# Patient Record
Sex: Male | Born: 1980 | Race: White | Hispanic: No | Marital: Married | Smoking: Former smoker
Health system: Southern US, Community
[De-identification: ages and names within clinical notes are randomized; demographics above are authoritative.]

## PROBLEM LIST (undated history)

## (undated) DIAGNOSIS — F1910 Other psychoactive substance abuse, uncomplicated: Secondary | ICD-10-CM

## (undated) DIAGNOSIS — F32A Depression, unspecified: Secondary | ICD-10-CM

## (undated) DIAGNOSIS — F419 Anxiety disorder, unspecified: Secondary | ICD-10-CM

## (undated) HISTORY — DX: Depression, unspecified: F32.A

## (undated) HISTORY — DX: Other psychoactive substance abuse, uncomplicated: F19.10

## (undated) HISTORY — PX: APPENDECTOMY: SHX54

## (undated) HISTORY — DX: Anxiety disorder, unspecified: F41.9

---

## 2017-06-17 ENCOUNTER — Emergency Department (HOSPITAL_COMMUNITY)
Admission: EM | Admit: 2017-06-17 | Discharge: 2017-06-17 | Disposition: A | Discharge status: Home / Self Care | Payer: Self-pay | Attending: Physician Assistant | Admitting: Physician Assistant

## 2017-06-17 ENCOUNTER — Emergency Department (HOSPITAL_COMMUNITY): Payer: Self-pay

## 2017-06-17 ENCOUNTER — Other Ambulatory Visit: Payer: Self-pay

## 2017-06-17 ENCOUNTER — Encounter (HOSPITAL_COMMUNITY): Payer: Self-pay | Admitting: Emergency Medicine

## 2017-06-17 DIAGNOSIS — R55 Syncope and collapse: Secondary | ICD-10-CM | POA: Insufficient documentation

## 2017-06-17 DIAGNOSIS — Z87891 Personal history of nicotine dependence: Secondary | ICD-10-CM | POA: Insufficient documentation

## 2017-06-17 LAB — CBC
HEMATOCRIT: 39 % (ref 39.0–52.0)
HEMOGLOBIN: 13.4 g/dL (ref 13.0–17.0)
MCH: 33.2 pg (ref 26.0–34.0)
MCHC: 34.4 g/dL (ref 30.0–36.0)
MCV: 96.5 fL (ref 78.0–100.0)
Platelets: 179 10*3/uL (ref 150–400)
RBC: 4.04 MIL/uL — ABNORMAL LOW (ref 4.22–5.81)
RDW: 13.5 % (ref 11.5–15.5)
WBC: 4.4 10*3/uL (ref 4.0–10.5)

## 2017-06-17 LAB — BASIC METABOLIC PANEL
ANION GAP: 10 (ref 5–15)
BUN: 13 mg/dL (ref 6–20)
CALCIUM: 8.9 mg/dL (ref 8.9–10.3)
CHLORIDE: 104 mmol/L (ref 101–111)
CO2: 25 mmol/L (ref 22–32)
Creatinine, Ser: 0.63 mg/dL (ref 0.61–1.24)
GFR calc Af Amer: >60 mL/min (ref >60)
GFR calc non Af Amer: >60 mL/min (ref >60)
GLUCOSE: 98 mg/dL (ref 65–99)
POTASSIUM: 3.9 mmol/L (ref 3.5–5.1)
Sodium: 139 mmol/L (ref 135–145)

## 2017-06-17 LAB — I-STAT TROPONIN, ED: Troponin i, poc: 0 ng/mL (ref 0.00–0.08)

## 2017-06-17 LAB — CBG MONITORING, ED: Glucose-Capillary: 97 mg/dL (ref 65–99)

## 2017-06-17 MED ORDER — SODIUM CHLORIDE 0.9 % IV BOLUS (SEPSIS)
1000.0000 mL | Freq: Once | INTRAVENOUS | Status: AC
Start: 2017-06-17 — End: 2017-06-17
  Administered 2017-06-17: 1000 mL via INTRAVENOUS

## 2017-06-17 NOTE — Discharge Instructions (Signed)
Follow up with PCP as needed

## 2017-06-17 NOTE — ED Provider Notes (Signed)
[facility]-EMERGENCY DEPT Provider Note   CSN: [id number] Arrival date & time: 06/17/17  0710     History   Chief Complaint Chief Complaint  Patient presents with  . Near Syncope    HPI Cyndie ChimeBryan Pinder is a 36 y.o. male.  HPI   Patient is a 36 year old male presenting with presyncope.  Patient felt like he might pass out.  Patient has a history of panic attacks.  He felt hot sweaty and nauseated.  At this past after he sat down.  He had clammy hands that time.  He had some left armpit pain left chest pain.  Resolved after the moment past.  Feels back to normal now.,  Little fatigue.  Patient does drink daily.  He thinks he says that his girlfriend thinks he drinks too much but he thinks he drinks the right amount.   History reviewed. No pertinent past medical history.  There are no active problems to display for this patient.   Past Surgical History:  Procedure Laterality Date  . APPENDECTOMY         Home Medications    Prior to Admission medications   Not on File    Family History No family history on file.  Social History Social History   Tobacco Use  . Smoking status: Former Smoker    Types: Cigarettes  . Tobacco comment: Quite 04/2017  Substance Use Topics  . Alcohol use: Yes    Frequency: Never  . Drug use: No     Allergies   Flexeril [cyclobenzaprine]   Review of Systems Review of Systems  Constitutional: Negative for activity change.  Respiratory: Negative for shortness of breath.   Cardiovascular: Negative for chest pain.  Gastrointestinal: Negative for abdominal pain.  Neurological: Positive for light-headedness. Negative for syncope.     Physical Exam Updated Vital Signs BP (!) 133/96 (BP Location: Right Arm)   Pulse 83   Temp 97.7 F (36.5 C) (Oral)   Resp 13   SpO2 99%   Physical Exam  Constitutional: He is oriented to person, place, and time. He appears well-nourished.  HENT:  Head:  Normocephalic.  Poor dentition  Eyes: Conjunctivae are normal.  Cardiovascular: Normal rate and regular rhythm.  No murmur heard. Pulmonary/Chest: Effort normal and breath sounds normal. No respiratory distress.  Abdominal: Soft. He exhibits no distension. There is no tenderness.  Musculoskeletal: He exhibits no edema.  Neurological: He is oriented to person, place, and time.  Skin: Skin is warm and dry. He is not diaphoretic.  Psychiatric: He has a normal mood and affect. His behavior is normal.     ED Treatments / Results  Labs (all labs ordered are listed, but only abnormal results are displayed) Labs Reviewed  BASIC METABOLIC PANEL  CBC  URINALYSIS, ROUTINE W REFLEX MICROSCOPIC  CBG MONITORING, ED  I-STAT TROPONIN, ED    EKG  EKG Interpretation  Date/Time:  Friday June 17 2017 07:21:05 EST Ventricular Rate:  79 PR Interval:    QRS Duration: 98 QT Interval:  363 QTC Calculation: 417 R Axis:   70 Text Interpretation:  Sinus rhythm Normal sinus rhythm Confirmed by Corlis LeakMackuen, Myonna Chisom ([id number]) on 06/17/2017 8:01:49 AM       Radiology No results found.  Procedures Procedures (including critical care time)  Medications Ordered in ED Medications  sodium chloride 0.9 % bolus 1,000 mL (not administered)     Initial Impression / Assessment and Plan / ED Course  I have reviewed the triage  vital signs and the nursing notes.  Pertinent labs & imaging results that were available during my care of the patient were reviewed by me and considered in my medical decision making (see chart for details).     Patient is a 36 year old male presenting with presyncope.  Patient felt like he might pass out.  Patient has a history of panic attacks.  He felt hot sweaty and nauseated.  At this past after he sat down.  He had clammy hands that time.  He had some left armpit pain left chest pain.  Resolved after the moment past.  Feels back to normal now.,  Little fatigue.  Patient  does drink daily.  He thinks he says that his girlfriend thinks he drinks too much but he thinks he drinks the right amount.  8:30 AM Patient had here with presyncope.  Likely secondary to dehydration perhaps from drinking last night.  Either way patient has normal vital signs normal physical exam.  Given the option of getting labs or discharge home.  Patient would like labs today.  We will get baseline labs, give a liter fluid.  Anticipate ability to discharge home.  Final Clinical Impressions(s) / ED Diagnoses   Final diagnoses:  None    ED Discharge Orders    None       Abelino DerrickMackuen, Jasiel Apachito Lyn, MD 06/17/17 1639

## 2017-06-17 NOTE — ED Notes (Signed)
ED Provider at bedside. 

## 2017-06-17 NOTE — ED Triage Notes (Signed)
Pt verbalizes "felt faint" without fall an hour ago at work; associated symptoms of nausea and left armpit pain. Pain subsided since event.

## 2017-12-25 DIAGNOSIS — L03011 Cellulitis of right finger: Secondary | ICD-10-CM | POA: Insufficient documentation

## 2017-12-25 HISTORY — DX: Cellulitis of right finger: L03.011

## 2018-02-23 ENCOUNTER — Encounter (HOSPITAL_BASED_OUTPATIENT_CLINIC_OR_DEPARTMENT_OTHER): Payer: Self-pay | Admitting: Emergency Medicine

## 2018-02-23 ENCOUNTER — Emergency Department (HOSPITAL_BASED_OUTPATIENT_CLINIC_OR_DEPARTMENT_OTHER)
Admission: EM | Admit: 2018-02-23 | Discharge: 2018-02-23 | Disposition: A | Discharge status: Home / Self Care | Payer: BLUE CROSS/BLUE SHIELD | Attending: Emergency Medicine | Admitting: Emergency Medicine

## 2018-02-23 ENCOUNTER — Other Ambulatory Visit: Payer: Self-pay

## 2018-02-23 DIAGNOSIS — Y999 Unspecified external cause status: Secondary | ICD-10-CM | POA: Diagnosis not present

## 2018-02-23 DIAGNOSIS — Y929 Unspecified place or not applicable: Secondary | ICD-10-CM | POA: Insufficient documentation

## 2018-02-23 DIAGNOSIS — S61012A Laceration without foreign body of left thumb without damage to nail, initial encounter: Secondary | ICD-10-CM | POA: Diagnosis present

## 2018-02-23 DIAGNOSIS — Y93G1 Activity, food preparation and clean up: Secondary | ICD-10-CM | POA: Insufficient documentation

## 2018-02-23 DIAGNOSIS — W268XXA Contact with other sharp object(s), not elsewhere classified, initial encounter: Secondary | ICD-10-CM | POA: Diagnosis not present

## 2018-02-23 DIAGNOSIS — S68628A Partial traumatic transphalangeal amputation of other finger, initial encounter: Secondary | ICD-10-CM | POA: Insufficient documentation

## 2018-02-23 DIAGNOSIS — S68119A Complete traumatic metacarpophalangeal amputation of unspecified finger, initial encounter: Secondary | ICD-10-CM

## 2018-02-23 DIAGNOSIS — Z87891 Personal history of nicotine dependence: Secondary | ICD-10-CM | POA: Diagnosis not present

## 2018-02-23 MED ORDER — CEPHALEXIN 250 MG PO CAPS
1000.0000 mg | ORAL_CAPSULE | Freq: Once | ORAL | Status: AC
  Administered 2018-02-23: 1000 mg via ORAL
  Filled 2018-02-23: qty 4

## 2018-02-23 MED ORDER — LIDOCAINE HCL (PF) 1 % IJ SOLN
5.0000 mL | Freq: Once | INTRAMUSCULAR | Status: AC
  Administered 2018-02-23: 5 mL via INTRADERMAL

## 2018-02-23 MED ORDER — CEPHALEXIN 500 MG PO CAPS: 500.0000 mg | ORAL_CAPSULE | Freq: Four times a day (QID) | ORAL | 0 refills | Status: DC

## 2018-02-23 MED ORDER — TETANUS-DIPHTH-ACELL PERTUSSIS 5-2.5-18.5 LF-MCG/0.5 IM SUSP
0.5000 mL | Freq: Once | INTRAMUSCULAR | Status: AC
  Administered 2018-02-23: 0.5 mL via INTRAMUSCULAR
  Filled 2018-02-23: qty 0.5

## 2018-02-23 MED ORDER — LIDOCAINE HCL (PF) 1 % IJ SOLN
INTRAMUSCULAR | Status: AC
  Filled 2018-02-23: qty 5

## 2018-02-23 NOTE — ED Triage Notes (Signed)
Pt sustained laceration to right thumb on food slicer last night at 2000. Mild bleeding noted.

## 2018-02-23 NOTE — ED Provider Notes (Signed)
[facility] Provider Note: Lowella Dell, MD, FACEP  CSN: [id number] MRN: [id number] ARRIVAL: 02/23/18 at 0337 ROOM: [facility]   CHIEF COMPLAINT  Laceration   HISTORY OF PRESENT ILLNESS  02/23/18 3:47 AM John Maxwell is a 37 y.o. male sliced the tip of his right thumb with a vegetable slicer yesterday evening about 8 PM.  He presents now because of persistent oozing of blood from the wound.  He had moderate pain at the site earlier which has improved.  There is a flap of skin that is still adherent to the thumb by a small pedicle.   History reviewed. No pertinent past medical history.  Past Surgical History:  Procedure Laterality Date  . APPENDECTOMY      No family history on file.  Social History   Tobacco Use  . Smoking status: Former Smoker    Types: Cigarettes  . Tobacco comment: Quite 04/2017  Substance Use Topics  . Alcohol use: Yes    Frequency: Never  . Drug use: No    Prior to Admission medications   Not on File    Allergies Flexeril [cyclobenzaprine]   REVIEW OF SYSTEMS  Negative except as noted here or in the History of Present Illness.   PHYSICAL EXAMINATION  Initial Vital Signs Blood pressure (!) 151/99, pulse 84, temperature 98.1 F (36.7 C), temperature source Oral, resp. rate 18, height 5\' 7"  (1.702 m), weight 74.8 kg, SpO2 98 %.  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 Imaging Studies: No results found.  ED COURSE and MDM  Nursing notes and initial vitals signs, including pulse oximetry, reviewed.  Vitals:   02/23/18 0344 02/23/18 0345  BP: (!) 151/99   Pulse: 84   Resp: 18   Temp: 98.1 F (36.7 C)   TempSrc: Oral   SpO2: 98%   Weight:  74.8 kg  Height:  5\' 7"  (1.702 m)   Patient started on Keflex for infection prophylaxis given subacute presentation.  The flap was sutured down in place with improvement in the bleeding.  The patient was advised that this flap may not "take" but will act as a natural bandage while the wound heals.  He was advised to return for signs of infection otherwise have the sutures out in about a week.  PROCEDURES   LACERATION REPAIR Performed by: Hanley Seamen Authorized by: Hanley Seamen Consent: Verbal consent obtained. Risks and benefits: risks, benefits and alternatives were discussed Consent given by: patient Patient identity confirmed: provided demographic data Prepped and Draped in normal sterile fashion Wound explored  Laceration Location: Right thumb  Laceration Length: 3 cm (flap)  No Foreign Bodies seen or palpated  Anesthesia: local infiltration  Local anesthetic: lidocaine 2% without epinephrine  Anesthetic total: 2 ml  Irrigation method: syringe Amount of cleaning: standard  Skin closure: 4-0 Prolene  Number of sutures: 5  Technique: Interrupted  Patient tolerance: Patient tolerated the procedure well with no immediate complications.     ED DIAGNOSES     ICD-10-CM   1. Traumatic amputation of fingertip, initial encounter Z61.096ES68.119A        Paula LibraMolpus, Analucia Hush,  MD 02/23/18 [phone]

## 2018-05-22 ENCOUNTER — Emergency Department (HOSPITAL_BASED_OUTPATIENT_CLINIC_OR_DEPARTMENT_OTHER)
Admission: EM | Admit: 2018-05-22 | Discharge: 2018-05-22 | Disposition: A | Discharge status: Home / Self Care | Payer: BLUE CROSS/BLUE SHIELD | Attending: Emergency Medicine | Admitting: Emergency Medicine

## 2018-05-22 ENCOUNTER — Other Ambulatory Visit: Payer: Self-pay

## 2018-05-22 ENCOUNTER — Encounter (HOSPITAL_BASED_OUTPATIENT_CLINIC_OR_DEPARTMENT_OTHER): Payer: Self-pay

## 2018-05-22 DIAGNOSIS — Z87891 Personal history of nicotine dependence: Secondary | ICD-10-CM | POA: Insufficient documentation

## 2018-05-22 DIAGNOSIS — R030 Elevated blood-pressure reading, without diagnosis of hypertension: Secondary | ICD-10-CM | POA: Diagnosis not present

## 2018-05-22 DIAGNOSIS — K0889 Other specified disorders of teeth and supporting structures: Secondary | ICD-10-CM

## 2018-05-22 MED ORDER — AMOXICILLIN-POT CLAVULANATE 875-125 MG PO TABS
1.0000 | ORAL_TABLET | Freq: Once | ORAL | Status: AC
  Administered 2018-05-22: 1 via ORAL
  Filled 2018-05-22: qty 1

## 2018-05-22 MED ORDER — NAPROXEN 500 MG PO TABS: 500.0000 mg | ORAL_TABLET | Freq: Two times a day (BID) | ORAL | 0 refills | Status: AC

## 2018-05-22 MED ORDER — AMOXICILLIN-POT CLAVULANATE 875-125 MG PO TABS: 1.0000 | ORAL_TABLET | Freq: Two times a day (BID) | ORAL | 0 refills | Status: AC

## 2018-05-22 MED ORDER — LIDOCAINE VISCOUS HCL 2 % MT SOLN: 15.0000 mL | OROMUCOSAL | 0 refills | Status: DC | PRN

## 2018-05-22 MED ORDER — CHLORHEXIDINE GLUCONATE 0.12 % MT SOLN: 15.0000 mL | Freq: Two times a day (BID) | OROMUCOSAL | 0 refills | Status: DC

## 2018-05-22 NOTE — Discharge Instructions (Addendum)
Please see the information and instructions below regarding your visit.  Your diagnoses today include:  1. Pain, dental   2. Elevated blood pressure reading without diagnosis of hypertension    You have a dental infection. It is very important that you get evaluated by a dentist as soon as possible. Call tomorrow to schedule an appointment. Ibuprofen or naproxen as needed for pain. Take your full course of antibiotics. Read the instructions below.  Tests performed today include: See side panel of your discharge paperwork for testing performed today. Vital signs are listed at the bottom of these instructions.   Medications prescribed:    Take any prescribed medications only as prescribed, and any over the counter medications only as directed on the packaging.  1. You are prescribed Augmentin, an antibiotic. Please take all of your antibiotics until finished.   You may develop abdominal discomfort or nausea from the antibiotic. If this occurs, you may take it with food. Some patients also get diarrhea with antibiotics. You may help offset this with probiotics which you can buy or get in yogurt. Do not eat or take the probiotics until 2 hours after your antibiotic. Some women develop vaginal yeast infections after antibiotics. If you develop unusual vaginal discharge after being on this medication, please see your primary care provider.   Some people develop allergies to antibiotics. Symptoms of antibiotic allergy can be mild and include a flat rash and itching. They can also be more serious and include:  ?Hives - Hives are raised, red patches of skin that are usually very itchy.  ?Lip or tongue swelling  ?Trouble swallowing or breathing  ?Blistering of the skin or mouth.  If you have any of these serious symptoms, please seek emergency medical care immediately.  2. You are prescribed naproxen, a non-steroidal anti-inflammatory agent (NSAID) for pain. You may take 500 mg every 12 hours as  needed for pain. If still requiring this medication around the clock for acute pain after 10 days, please see your primary healthcare provider.  You may combine this medication with Tylenol, 650 mg every 6 hours, so you are receiving something for pain every 3 hours.  This is not a long-term medication unless under the care and direction of your primary provider. Taking this medication long-term and not under the supervision of a healthcare provider could increase the risk of stomach ulcers, kidney problems, and cardiovascular problems such as high blood pressure.   3. You are prescribed viscous lidocaine swish and spit every 6 hours as needed for tooth and gum discomfort. Do not swallow.   4. You are prescribed chlorhexidine solution swish and spit twice daily. Do not swallow. This is to clear bacteria from your mouth.    Home care instructions:  Please follow any educational materials contained in this packet.   Eat a soft or liquid diet and rinse your mouth out after meals with warm water. You should see a dentist or return here at once if you have increased swelling, increased pain or uncontrolled bleeding from the site of your injury.  Follow-up instructions: It is very important that you see a dentist as soon as possible. There is a list of dentists attached to this packet if you do not have care established with a dentist already. Please give a call to a dentist of your choice tomorrow.  Return instructions:  Please return to the Emergency Department if you experience worsening symptoms.  Please seek care if you note any of the following  about your dental pain:  You have increased pain not controlled with medicines.  You have swelling around your tooth, in your face or neck.  You have bleeding which starts, continues, or gets worse.  You have a fever >101 If you are unable to open your mouth Please return if you have any other emergent concerns.  Additional Information:   Your  vital signs today were: BP (!) 136/93 (BP Location: Left Arm)    Pulse 95    Temp 99 F (37.2 C) (Oral)    Resp 16    Ht 5\' 7"  (1.702 m)    Wt 81.6 kg    SpO2 96%    BMI 28.19 kg/m  If your blood pressure (BP) was elevated on multiple readings during this visit above 130 for the top number or above 80 for the bottom number, please have this repeated by your primary care provider within one month. --------------  Thank you for allowing [place] to participate in your care today.

## 2018-05-22 NOTE — ED Provider Notes (Signed)
[facility] EMERGENCY DEPARTMENT Provider Note   CSN: [id number] Arrival date & time: 05/22/18  2004     History   Chief Complaint Chief Complaint  Patient presents with  . Dental Pain    HPI Cyndie ChimeBryan Gasiorowski is a 37 y.o. male.  HPI    Cyndie ChimeBryan Scobee is a 37 y.o. male who presents to the Emergency Department complaining of persistent, gradually worsening, right-sided, lower dental pain beginning one day ago. Pt describes their pain as sharp throbbing. Pt has been taking an old antibiotic at home with minimal relief of pain. Pain is exacerbated by chewing or palpation. They are currently followed by dentistry.  Pt reports that he feels a slight swelling on the lateral surface of his right side of his cheek but denies fever, chills, difficulty breathing, difficulty swallowing.   History reviewed. No pertinent past medical history.  There are no active problems to display for this patient.   Past Surgical History:  Procedure Laterality Date  . APPENDECTOMY          Home Medications    Prior to Admission medications   Not on File    Family History No family history on file.  Social History Social History   Tobacco Use  . Smoking status: Former Smoker    Types: Cigarettes  . Smokeless tobacco: Never Used  Substance Use Topics  . Alcohol use: Yes    Frequency: Never    Comment: daily  . Drug use: No     Allergies   Flexeril [cyclobenzaprine]   Review of Systems Review of Systems  Constitutional: Negative for chills and fever.  HENT: Positive for dental problem. Negative for sore throat, trouble swallowing and voice change.   Respiratory: Negative for shortness of breath and stridor.      Physical Exam Updated Vital Signs BP (!) 136/93 (BP Location: Left Arm)   Pulse 95   Temp 99 F (37.2 C) (Oral)   Resp 16   Ht 5\' 7"  (1.702 m)   Wt 81.6 kg   SpO2 96%   BMI 28.19 kg/m   Physical Exam  Constitutional: He appears well-developed  and well-nourished. No distress.  Sitting comfortably in bed.  HENT:  Head: Normocephalic and atraumatic.  Mouth/Throat:    Dental cavities and poor oral dentition noted. Pain along tooth as depicted in image. No abscess noted. Midline uvula. No trismus. OP clear and moist. No oropharyngeal erythema or edema. Neck supple with no tenderness. No facial edema.  Eyes: Conjunctivae are normal. Right eye exhibits no discharge. Left eye exhibits no discharge.  EOMs normal to gross examination.  Neck: Normal range of motion.  Cardiovascular: Normal rate and regular rhythm.  Intact, 2+ radial pulse.  Pulmonary/Chest: Effort normal.  Normal respiratory effort. Patient converses comfortably. No audible wheeze or stridor.  Abdominal: He exhibits no distension.  Musculoskeletal: Normal range of motion.  Neurological: He is alert.  Cranial nerves intact to gross observation. Patient moves extremities without difficulty.  Skin: Skin is warm and dry. He is not diaphoretic.  Psychiatric: He has a normal mood and affect. His behavior is normal. Judgment and thought content normal.  Nursing note and vitals reviewed.    ED Treatments / Results  Labs (all labs ordered are listed, but only abnormal results are displayed) Labs Reviewed - No data to display  EKG None  Radiology No results found.  Procedures Procedures (including critical care time)  Medications Ordered in ED Medications  amoxicillin-clavulanate (AUGMENTIN) 875-125 MG  per tablet 1 tablet (has no administration in time range)     Initial Impression / Assessment and Plan / ED Course  I have reviewed the triage vital signs and the nursing notes.  Pertinent labs & imaging results that were available during my care of the patient were reviewed by me and considered in my medical decision making (see chart for details).     Oliver Heitzenrater is a 37 y.o. male who presents to ED for dental pain. No abscess requiring immediate  incision and drainage. Patient is afebrile, non toxic appearing, and swallowing secretions well. Exam not concerning for Ludwig's angina or pharyngeal abscess. Will treat with Augmentin given subjective sense of face swelling.  We will also treat with chlorhexidine rinse and viscous lidocaine.  Patient to follow-up with his primary dentist.  Patient voices understanding and is agreeable to plan.  Final Clinical Impressions(s) / ED Diagnoses   Final diagnoses:  Pain, dental  Elevated blood pressure reading without diagnosis of hypertension    ED Discharge Orders         Ordered    amoxicillin-clavulanate (AUGMENTIN) 875-125 MG tablet  Every 12 hours     05/22/18 2247    chlorhexidine (PERIDEX) 0.12 % solution  2 times daily     05/22/18 2247    lidocaine (XYLOCAINE) 2 % solution  As needed     05/22/18 2247    naproxen (NAPROSYN) 500 MG tablet  2 times daily     05/22/18 2247           Elisha Ponder, PA-C 05/23/18 0127    Sabas Sous, MD 05/23/18 2318

## 2018-05-22 NOTE — ED Triage Notes (Signed)
C/o right lower toothache x 5 months-worse x 2 days-hx of poor dental hygiene-NAD-steady gait

## 2020-12-05 DIAGNOSIS — S91332A Puncture wound without foreign body, left foot, initial encounter: Secondary | ICD-10-CM | POA: Diagnosis not present

## 2020-12-05 DIAGNOSIS — Z23 Encounter for immunization: Secondary | ICD-10-CM | POA: Diagnosis not present

## 2021-06-23 ENCOUNTER — Other Ambulatory Visit: Payer: Self-pay

## 2021-06-23 ENCOUNTER — Other Ambulatory Visit (HOSPITAL_BASED_OUTPATIENT_CLINIC_OR_DEPARTMENT_OTHER): Payer: Self-pay

## 2021-06-23 ENCOUNTER — Emergency Department (HOSPITAL_BASED_OUTPATIENT_CLINIC_OR_DEPARTMENT_OTHER): Payer: BC Managed Care – PPO

## 2021-06-23 ENCOUNTER — Encounter (HOSPITAL_BASED_OUTPATIENT_CLINIC_OR_DEPARTMENT_OTHER): Payer: Self-pay | Admitting: *Deleted

## 2021-06-23 ENCOUNTER — Emergency Department (HOSPITAL_BASED_OUTPATIENT_CLINIC_OR_DEPARTMENT_OTHER)
Admission: EM | Admit: 2021-06-23 | Discharge: 2021-06-23 | Disposition: A | Discharge status: Home / Self Care | Payer: BC Managed Care – PPO | Attending: Emergency Medicine | Admitting: Emergency Medicine

## 2021-06-23 DIAGNOSIS — X501XXA Overexertion from prolonged static or awkward postures, initial encounter: Secondary | ICD-10-CM | POA: Diagnosis not present

## 2021-06-23 DIAGNOSIS — Z87891 Personal history of nicotine dependence: Secondary | ICD-10-CM | POA: Diagnosis not present

## 2021-06-23 DIAGNOSIS — S20212A Contusion of left front wall of thorax, initial encounter: Secondary | ICD-10-CM | POA: Insufficient documentation

## 2021-06-23 DIAGNOSIS — S20211A Contusion of right front wall of thorax, initial encounter: Secondary | ICD-10-CM | POA: Insufficient documentation

## 2021-06-23 DIAGNOSIS — R0789 Other chest pain: Secondary | ICD-10-CM

## 2021-06-23 DIAGNOSIS — S299XXA Unspecified injury of thorax, initial encounter: Secondary | ICD-10-CM | POA: Diagnosis not present

## 2021-06-23 MED ORDER — LIDOCAINE 5 % EX PTCH
1.0000 | MEDICATED_PATCH | CUTANEOUS | 0 refills | Status: DC
  Filled 2021-06-23: qty 30, 30d supply, fill #0

## 2021-06-23 NOTE — Discharge Instructions (Addendum)
Recheck with your primary care provider if pain continues.  Return to the emergency room for worsening or concerning symptoms.  You can apply Lidoderm patch to your chest wall as needed for pain.  Use incentive spirometer as discussed.

## 2021-06-23 NOTE — ED Provider Notes (Signed)
[facility] EMERGENCY DEPARTMENT Provider Note   CSN: [id number] Arrival date & time: 06/23/21  1259     History Chief Complaint  Patient presents with   Rib Injury    John Maxwell is a 40 y.o. male.  40 year old male with complaint of bilateral, L>R rib pain x 2 days, onset after he was leaning over the railing trying to pull something over the railing and felt a pop in his left ribs. Pain radiates to bilateral posterior rib cage. Denies SHOB, chills, abdominal pain. No other complaints or concerns.       History reviewed. No pertinent past medical history.  There are no problems to display for this patient.   Past Surgical History:  Procedure Laterality Date   APPENDECTOMY         No family history on file.  Social History   Tobacco Use   Smoking status: Former    Types: Cigarettes   Smokeless tobacco: Never  Vaping Use   Vaping Use: Never used  Substance Use Topics   Alcohol use: Yes    Comment: daily   Drug use: No    Home Medications Prior to Admission medications   Medication Sig Start Date End Date Taking? Authorizing Provider  lidocaine (LIDODERM) 5 % Place 1 patch onto the skin daily. Remove & Discard patch within 12 hours or as directed by MD 06/23/21  Yes Jeannie Fend, PA-C  chlorhexidine (PERIDEX) 0.12 % solution Use as directed 15 mLs in the mouth or throat 2 (two) times daily. Swish and spit.  Do not swallow. 05/22/18   Aviva Kluver B, PA-C  lidocaine (XYLOCAINE) 2 % solution Use as directed 15 mLs in the mouth or throat as needed for mouth pain. Swish and spit.  Do not swallow. 05/22/18   Aviva Kluver B, PA-C    Allergies    Flexeril [cyclobenzaprine]  Review of Systems   Review of Systems  Constitutional:  Negative for diaphoresis and fever.  Respiratory:  Negative for chest tightness and shortness of breath.   Cardiovascular:  Positive for chest pain.  Gastrointestinal:  Negative for abdominal pain.   Musculoskeletal:  Positive for back pain. Negative for arthralgias and myalgias.  Skin:  Negative for rash and wound.  Neurological:  Negative for weakness.  Hematological:  Does not bruise/bleed easily.  Psychiatric/Behavioral:  Negative for confusion.   All other systems reviewed and are negative.  Physical Exam Updated Vital Signs BP (!) 130/94 (BP Location: Right Arm)    Pulse 88    Temp 99.8 F (37.7 C) (Oral)    Resp 18    Ht 5\' 7"  (1.702 m)    Wt 74.8 kg    SpO2 97%    BMI 25.84 kg/m   Physical Exam Vitals and nursing note reviewed.  Cardiovascular:     Rate and Rhythm: Normal rate and regular rhythm.     Heart sounds: Normal heart sounds.  Pulmonary:     Effort: Pulmonary effort is normal.     Breath sounds: Normal breath sounds.  Chest:       Comments: Very mild bilateral lower anterior chest wall tenderness without crepitus  Abdominal:     Palpations: Abdomen is soft.     Tenderness: There is no abdominal tenderness.  Skin:    General: Skin is warm and dry.     Findings: Bruising present.  Neurological:     Mental Status: He is alert and oriented to person, place, and  time.  Psychiatric:        Behavior: Behavior normal.    ED Results / Procedures / Treatments   Labs (all labs ordered are listed, but only abnormal results are displayed) Labs Reviewed - No data to display  EKG None  Radiology DG Ribs Unilateral W/Chest Left  Result Date: 06/23/2021 CLINICAL DATA:  rib pain, injury EXAM: LEFT RIBS AND CHEST - 3+ VIEW COMPARISON:  Chest radiographs 06/17/2017. FINDINGS: No displaced fracture. No visible pneumothorax or pleural effusion. Both lungs are clear. Heart size and mediastinal contours are within normal limits. IMPRESSION: No evidence of a displaced rib fracture. Electronically Signed   By: Feliberto Harts M.D.   On: 06/23/2021 14:51    Procedures Procedures   Medications Ordered in ED Medications - No data to display  ED Course  I have  reviewed the triage vital signs and the nursing notes.  Pertinent labs & imaging results that were available during my care of the patient were reviewed by me and considered in my medical decision making (see chart for details).  Clinical Course as of 06/23/21 1541  Tue Jun 23, 2021  9214 40 year old male presents with complaint of pain in his ribs after leaning over a railing to pull something up over the railing 2 days ago.  On exam, he is found to have tenderness to the anterior lower chest wall without crepitus.  There is a small area of ecchymosis to the upper chest which appears old.  Abdomen is soft and nontender.  Vitals reviewed, blood pressure is mildly elevated, O2 sat 97% on room air, lungs good auscultation. Rib series x-rays obtained, negative for fracture.  Recommend incentive spirometer, can take Motrin Tylenol for pain or apply Lidoderm patch.  Return to ED for worsening or concerning symptoms otherwise follow-up with PCP. [LM]    Clinical Course User Index [LM] John Maxwell   MDM Rules/Calculators/A&P                             Final Clinical Impression(s) / ED Diagnoses Final diagnoses:  Acute chest wall pain    Rx / DC Orders ED Discharge Orders          Ordered    lidocaine (LIDODERM) 5 %  Every 24 hours        06/23/21 1503             Jeannie Fend, PA-C 06/23/21 1541    Milagros Loll, MD 06/25/21 [phone]

## 2021-06-23 NOTE — ED Triage Notes (Signed)
Injury to his right ribs a week ago. This week he was leaning over a bar and felt a "pop" in his left ribs.

## 2021-08-05 DIAGNOSIS — K5901 Slow transit constipation: Secondary | ICD-10-CM | POA: Diagnosis not present

## 2021-10-06 ENCOUNTER — Emergency Department (HOSPITAL_BASED_OUTPATIENT_CLINIC_OR_DEPARTMENT_OTHER): Payer: BC Managed Care – PPO

## 2021-10-06 ENCOUNTER — Other Ambulatory Visit: Payer: Self-pay

## 2021-10-06 ENCOUNTER — Encounter (HOSPITAL_BASED_OUTPATIENT_CLINIC_OR_DEPARTMENT_OTHER): Payer: Self-pay

## 2021-10-06 ENCOUNTER — Emergency Department (HOSPITAL_BASED_OUTPATIENT_CLINIC_OR_DEPARTMENT_OTHER)
Admission: EM | Admit: 2021-10-06 | Discharge: 2021-10-06 | Disposition: A | Discharge status: Home / Self Care | Payer: BC Managed Care – PPO | Attending: Emergency Medicine | Admitting: Emergency Medicine

## 2021-10-06 DIAGNOSIS — W208XXA Other cause of strike by thrown, projected or falling object, initial encounter: Secondary | ICD-10-CM | POA: Insufficient documentation

## 2021-10-06 DIAGNOSIS — M25572 Pain in left ankle and joints of left foot: Secondary | ICD-10-CM | POA: Diagnosis not present

## 2021-10-06 DIAGNOSIS — M79672 Pain in left foot: Secondary | ICD-10-CM | POA: Diagnosis not present

## 2021-10-06 MED ORDER — IBUPROFEN 800 MG PO TABS: 800.0000 mg | ORAL_TABLET | Freq: Three times a day (TID) | ORAL | 0 refills | Status: DC

## 2021-10-06 MED ORDER — IBUPROFEN 800 MG PO TABS
800.0000 mg | ORAL_TABLET | Freq: Once | ORAL | Status: AC
  Administered 2021-10-06: 800 mg via ORAL
  Filled 2021-10-06: qty 1

## 2021-10-06 NOTE — Discharge Instructions (Addendum)
Take ibuprofen 3 times daily for the next 7 days. ?Ice the foot, keep it elevated at home.  Take the next few days off work, tomorrow morning call the orthopedic doctor and set up an appointment for later this week.  Return if worse. ?

## 2021-10-06 NOTE — ED Provider Notes (Signed)
?[facility] EMERGENCY DEPARTMENT ?Provider Note ? ? ?CSN: [id number] ?Arrival date & time: 10/06/21  1758 ? ?  ? ?History ? ?Chief Complaint  ?Patient presents with  ? Ankle Pain  ? ? ?John Maxwell is a 41 y.o. male. ? ? ?Ankle Pain ? ?Patient presents with left foot and ankle pain.  It started 2 to 3 weeks ago when he dropped a heavy object on it.  Has been having pain constantly, has not been getting any better and has been getting worse with time.  Pain is worsened whenever he walks on it or when he everts it.  He has not tried anything at home for it, he works on his feet all day at [employer]  Denies any paresthesias.  ? ?Home Medications ?Prior to Admission medications   ?Medication Sig Start Date End Date Taking? Authorizing Provider  ?chlorhexidine (PERIDEX) 0.12 % solution Use as directed 15 mLs in the mouth or throat 2 (two) times daily. Swish and spit.  Do not swallow. 05/22/18   Aviva Kluver B, PA-C  ?lidocaine (LIDODERM) 5 % Place 1 patch onto the skin daily. Remove & Discard patch within 12 hours or as directed by MD 06/23/21   Jeannie Fend, PA-C  ?lidocaine (XYLOCAINE) 2 % solution Use as directed 15 mLs in the mouth or throat as needed for mouth pain. Swish and spit.  Do not swallow. 05/22/18   Elisha Ponder, PA-C  ?   ? ?Allergies    ?Flexeril [cyclobenzaprine]   ? ?Review of Systems   ?Review of Systems ? ?Physical Exam ?Updated Vital Signs ?BP (!) 119/94 (BP Location: Right Arm)   Pulse 81   Temp 98.3 ?F (36.8 ?C) (Oral)   Resp 16   Ht 5\' 7"  (1.702 m)   SpO2 99%   BMI 25.84 kg/m?  ?Physical Exam ?Vitals and nursing note reviewed. Exam conducted with a chaperone present.  ?Constitutional:   ?   General: He is not in acute distress. ?   Appearance: Normal appearance.  ?HENT:  ?   Head: Normocephalic and atraumatic.  ?Eyes:  ?   General: No scleral icterus. ?   Extraocular Movements: Extraocular movements intact.  ?   Pupils: Pupils are equal, round, and reactive to light.   ?Cardiovascular:  ?   Pulses: Normal pulses.  ?Musculoskeletal:     ?   General: Swelling and tenderness present.  ?   Comments: ROM intact, pain is worse with eversion of the left foot.  ?Skin: ?   Capillary Refill: Capillary refill takes less than 2 seconds.  ?   Coloration: Skin is not jaundiced.  ?Neurological:  ?   Mental Status: He is alert. Mental status is at baseline.  ?   Coordination: Coordination normal.  ? ? ?ED Results / Procedures / Treatments   ?Labs ?(all labs ordered are listed, but only abnormal results are displayed) ?Labs Reviewed - No data to display ? ?EKG ?None ? ?Radiology ?DG Ankle Complete Left ? ?Result Date: 10/06/2021 ?CLINICAL DATA:  Left ankle pain EXAM: LEFT ANKLE COMPLETE - 3+ VIEW COMPARISON:  None. FINDINGS: There is no evidence of fracture, dislocation, or joint effusion. There is no evidence of arthropathy or other focal bone abnormality. Soft tissues are unremarkable. IMPRESSION: No acute abnormality Electronically Signed   By: 12/06/2021.  Shick M.D.   On: 10/06/2021 18:46   ? ?Procedures ?Procedures  ? ? ?Medications Ordered in ED ?Medications  ?ibuprofen (ADVIL) tablet 800 mg (has no  administration in time range)  ? ? ?ED Course/ Medical Decision Making/ A&P ?  ?                        ?Medical Decision Making ?Amount and/or Complexity of Data Reviewed ?Radiology: ordered. ? ?Risk ?Prescription drug management. ? ? ?This is a 41 year old male presenting with left foot and ankle pain.  Differential diagnosis includes but is not limited to fracture, dislocation, vascular injury, compartment syndrome. ? ?On physical exam patient has good range of motion, tolerates passive range of motion making septic joint unlikely.  There is no obvious discoloration to the skin, no contusions or open fractures.  DP and PT are 2+, brisk cap refill. ? ?I ordered, viewed and interpreted the x-ray and I do not see any fractures dislocations.  Agree with radiology interpretation. ? ?I ordered patient  ibuprofen. ? ?I suspect patient is having myalgias, that said he is been having pain for decent amount of time.  Will discharge with ibuprofen course and have him follow-up with orthopedics.  Work note provided. ? ? ? ? ? ? ? ?Final Clinical Impression(s) / ED Diagnoses ?Final diagnoses:  ?None  ? ? ?Rx / DC Orders ?ED Discharge Orders   ? ? None  ? ?  ? ? ?  ?Theron Arista, PA-C ?10/06/21 2116 ? ?  ?Sloan Leiter, DO ?10/09/21 0052 ? ?

## 2021-10-06 NOTE — ED Triage Notes (Addendum)
Pt arrives with left ankle pain that started last week. Per pt, he dropped an object on his ankle and the pain has gotten worse and started to swell.  ?

## 2021-10-22 DIAGNOSIS — L03012 Cellulitis of left finger: Secondary | ICD-10-CM | POA: Diagnosis not present

## 2022-02-21 ENCOUNTER — Ambulatory Visit (HOSPITAL_COMMUNITY)
Admission: EM | Admit: 2022-02-21 | Discharge: 2022-02-21 | Disposition: A | Discharge status: Home / Self Care | Payer: BC Managed Care – PPO

## 2022-02-21 DIAGNOSIS — F10230 Alcohol dependence with withdrawal, uncomplicated: Secondary | ICD-10-CM | POA: Diagnosis not present

## 2022-02-21 NOTE — ED Triage Notes (Signed)
Pt presents to [facility] accompanied by his wife seeking detox and treatment for alcoholism. Pt reports consuming about 3, 12oz beers daily. Pt reports last use this morning. Pt states he works third shift and uses alcohol to help him sleep. Pt denies SI/HI and AVH.

## 2022-02-21 NOTE — ED Provider Notes (Signed)
Behavioral Health Urgent Care Medical Screening Exam  Patient Name: John Maxwell MRN: [id number] Date of Evaluation: 02/21/22 Diagnosis:  Final diagnoses:  Alcohol dependence with uncomplicated withdrawal (HCC)    History of Present illness: John Maxwell is a 41 y.o. male patient who presented to the [facility] behavioral health urgent care voluntary accompanied by his wife Alcario Drought with a chief complaint of requesting help with alcohol abuse.   Patient is seen and evaluated face-to-face by this provider, and chart reviewed. Patient has no past significant psychiatric history. On evaluation, patient is alert and oriented x 4. His thought process is logical and goal oriented. His speech is clear and coherent. His mood is anxious and affect is congruent.  Patient states that he is looking for outpatient resources for alcohol abuse treatment. He reports drinking 3 (12-16 oz) beers or more daily. He states that his last drink was this morning when he started to experience withdrawal symptoms. He states that he tried to stop drinking two days and started having withdrawal symptoms. He describes his symptoms at that time as vomiting, tremors, and chills. He denies withdrawal symptoms at this time. He reports drinking alcohol since age 89. He states that he works full-time on third shift at [employer] and that he has to drink to help him sleep during the day. He states that his longest sobriety was for 29 weeks in 2007 when he was in the [employer] and when he was deployed to [place]. He denies any history of alcohol withdrawal seizures or delirium tremens. He denies using illicit drugs.   He denies suicidal ideations. He denies homicidal ideations. There is no objective evidence that the patient is currently responding to internal or external stimuli. He denies a past psychiatric history. He denies past psychiatric hospitalizations. He resides with his wife and 11-year-old daughter. He reports a family history of  his father committing suicide by shooting himself 6 years ago.  Plan of care. I discussed with the patient inpatient detox versus outpatient substance abuse treatment. I highly suggested that the patient be admitted to the facility based crisis unit for detox. Patient states that he is interested in outpatient treatment at this time and is not ready to commit to inpatient detox. He states that he will talk it over with his wife but for now he would like resources for outpatient. I discussed with the patient the symptoms and signs associated with alcohol withdrawal. Patient advised to present to the nearest emergency department or call 911 if he starts to develop severe withdrawal symptoms. Patient verbalizes understanding and agrees to the stated plan. Patient provided with educational resources on alcohol abuse and withdrawal. He was provided with a list of resources for inpatient/outpatient substance abuse facilities.    Psychiatric Specialty Exam  Presentation  General Appearance:Casual  Eye Contact:Fair  Speech:Clear and Coherent  Speech Volume:Normal  Mood and Affect  Mood:Anxious  Affect:Congruent   Thought Process  Thought Processes:Coherent; Goal Directed  Descriptions of Associations:Intact  Orientation:Full (Time, Place and Person)  Thought Content:Logical    Hallucinations:None  Ideas of Reference:None  Suicidal Thoughts:No  Homicidal Thoughts:No   Sensorium  Memory:Immediate Fair; Recent Fair; Remote Fair  Judgment:Intact  Insight:Present   Executive Functions  Concentration:Fair  Attention Span:Fair  Recall:Fair  Fund of Knowledge:Fair  Language:Fair   Psychomotor Activity  Psychomotor Activity:Normal   Assets  Assets:Communication Skills; Desire for Improvement; Financial Resources/Insurance; Housing; Intimacy; Leisure Time; Physical Health; Social Support; Resilience; Transportation; Talents/Skills   Sleep  Sleep:Poor  Number  of  hours: 5   Physical Exam: Physical Exam HENT:     Head: Normocephalic.     Nose: Nose normal.  Eyes:     Conjunctiva/sclera: Conjunctivae normal.  Cardiovascular:     Rate and Rhythm: Normal rate.     Comments: Hypertensive, no history of HTN. Possibly, related to alcohol withdrawal.  Pulmonary:     Effort: Pulmonary effort is normal.  Musculoskeletal:        General: Normal range of motion.     Cervical back: Normal range of motion.  Neurological:     Mental Status: He is alert and oriented to person, place, and time.    Review of Systems  Constitutional: Negative.   HENT: Negative.    Respiratory: Negative.    Cardiovascular:  Negative for chest pain and palpitations.  Gastrointestinal: Negative.   Genitourinary: Negative.   Musculoskeletal: Negative.   Skin: Negative.   Neurological: Negative.   Endo/Heme/Allergies: Negative.   Psychiatric/Behavioral:  Positive for suicidal ideas.    Blood pressure (!) 152/104, pulse 88, resp. rate 18, SpO2 97 %. There is no height or weight on file to calculate BMI.  Musculoskeletal: Strength & Muscle Tone: within normal limits Gait & Station: normal Patient leans: N/A   [facility] Discharge Disposition for Follow up and Recommendations: Based on my evaluation the patient does not appear to have an emergency medical condition and can be discharged with resources and follow up care in outpatient services for outpatient substance abuse treatment.   Discharge recommendations:  Please see information for follow-up for substance abuse treatment.  Please follow up with your primary care provider for all medical related needs and elevated blood pressure.   Therapy: We recommend that patient participate in individual therapy to address mental health concerns and substance abuse.  Safety:  The patient should abstain from use of illicit substances/drugs and abuse of any medications. If symptoms worsen or do not continue to improve or if  the patient becomes actively suicidal or homicidal then it is recommended that the patient return to the closest hospital emergency department, the [facility], or call 911 for further evaluation and treatment. National Suicide Prevention Lifeline 1-800-SUICIDE or 418-349-2110.  About 988 988 offers 24/7 access to trained crisis counselors who can help people experiencing mental health-related distress. People can call or text 988 or chat 988lifeline.org for themselves or if they are worried about a loved one who may need crisis support.   Get help right away if: You have fast or uneven heartbeats. You have chest pain. You have trouble breathing. You are told you had a seizure. You see, hear, feel, smell, or taste something that is not there. You get very confused. These symptoms may be an emergency. Get help right away. Call 911.    Emet Rafanan L, NP 02/21/2022, 6:18 PM

## 2022-02-21 NOTE — Discharge Instructions (Addendum)
Discharge recommendations:  Please see information for follow-up for substance abuse treatment.  Please follow up with your primary care provider for all medical related needs and elevated blood pressure.   Therapy: We recommend that patient participate in individual therapy to address mental health concerns and substance abuse.  Safety:  The patient should abstain from use of illicit substances/drugs and abuse of any medications. If symptoms worsen or do not continue to improve or if the patient becomes actively suicidal or homicidal then it is recommended that the patient return to the closest hospital emergency department, the [facility], or call 911 for further evaluation and treatment. National Suicide Prevention Lifeline 1-800-SUICIDE or 682-504-4871.  About 988 988 offers 24/7 access to trained crisis counselors who can help people experiencing mental health-related distress. People can call or text 988 or chat 988lifeline.org for themselves or if they are worried about a loved one who may need crisis support.   Get help right away if: You have fast or uneven heartbeats. You have chest pain. You have trouble breathing. You are told you had a seizure. You see, hear, feel, smell, or taste something that is not there. You get very confused. These symptoms may be an emergency. Get help right away. Call 911.

## 2022-07-31 ENCOUNTER — Emergency Department (HOSPITAL_BASED_OUTPATIENT_CLINIC_OR_DEPARTMENT_OTHER): Payer: BC Managed Care – PPO

## 2022-07-31 ENCOUNTER — Encounter (HOSPITAL_BASED_OUTPATIENT_CLINIC_OR_DEPARTMENT_OTHER): Payer: Self-pay | Admitting: Emergency Medicine

## 2022-07-31 ENCOUNTER — Other Ambulatory Visit: Payer: Self-pay

## 2022-07-31 ENCOUNTER — Emergency Department (HOSPITAL_BASED_OUTPATIENT_CLINIC_OR_DEPARTMENT_OTHER)
Admission: EM | Admit: 2022-07-31 | Discharge: 2022-07-31 | Disposition: A | Discharge status: Home / Self Care | Payer: BC Managed Care – PPO | Attending: Emergency Medicine | Admitting: Emergency Medicine

## 2022-07-31 DIAGNOSIS — K760 Fatty (change of) liver, not elsewhere classified: Secondary | ICD-10-CM | POA: Diagnosis not present

## 2022-07-31 DIAGNOSIS — R7401 Elevation of levels of liver transaminase levels: Secondary | ICD-10-CM | POA: Diagnosis not present

## 2022-07-31 DIAGNOSIS — R160 Hepatomegaly, not elsewhere classified: Secondary | ICD-10-CM | POA: Insufficient documentation

## 2022-07-31 DIAGNOSIS — E871 Hypo-osmolality and hyponatremia: Secondary | ICD-10-CM | POA: Diagnosis not present

## 2022-07-31 DIAGNOSIS — E878 Other disorders of electrolyte and fluid balance, not elsewhere classified: Secondary | ICD-10-CM | POA: Diagnosis not present

## 2022-07-31 DIAGNOSIS — R109 Unspecified abdominal pain: Secondary | ICD-10-CM | POA: Diagnosis not present

## 2022-07-31 DIAGNOSIS — E876 Hypokalemia: Secondary | ICD-10-CM | POA: Insufficient documentation

## 2022-07-31 DIAGNOSIS — K7031 Alcoholic cirrhosis of liver with ascites: Secondary | ICD-10-CM | POA: Insufficient documentation

## 2022-07-31 DIAGNOSIS — K5730 Diverticulosis of large intestine without perforation or abscess without bleeding: Secondary | ICD-10-CM | POA: Diagnosis not present

## 2022-07-31 DIAGNOSIS — R718 Other abnormality of red blood cells: Secondary | ICD-10-CM | POA: Insufficient documentation

## 2022-07-31 DIAGNOSIS — R112 Nausea with vomiting, unspecified: Secondary | ICD-10-CM

## 2022-07-31 DIAGNOSIS — N200 Calculus of kidney: Secondary | ICD-10-CM | POA: Diagnosis not present

## 2022-07-31 LAB — COMPREHENSIVE METABOLIC PANEL
ALT: 37 U/L (ref 0–44)
AST: 175 U/L — ABNORMAL HIGH (ref 15–41)
Albumin: 2.8 g/dL — ABNORMAL LOW (ref 3.5–5.0)
Alkaline Phosphatase: 245 U/L — ABNORMAL HIGH (ref 38–126)
Anion gap: 9 (ref 5–15)
BUN: <5 mg/dL — ABNORMAL LOW (ref 6–20)
CO2: 28 mmol/L (ref 22–32)
Calcium: 7.6 mg/dL — ABNORMAL LOW (ref 8.9–10.3)
Chloride: 95 mmol/L — ABNORMAL LOW (ref 98–111)
Creatinine, Ser: 0.59 mg/dL — ABNORMAL LOW (ref 0.61–1.24)
GFR, Estimated: >60 mL/min (ref >60)
Glucose, Bld: 137 mg/dL — ABNORMAL HIGH (ref 70–99)
Potassium: 3.4 mmol/L — ABNORMAL LOW (ref 3.5–5.1)
Sodium: 132 mmol/L — ABNORMAL LOW (ref 135–145)
Total Bilirubin: 2.6 mg/dL — ABNORMAL HIGH (ref 0.3–1.2)
Total Protein: 6.9 g/dL (ref 6.5–8.1)

## 2022-07-31 LAB — CBC WITH DIFFERENTIAL/PLATELET
Abs Immature Granulocytes: 0.05 10*3/uL (ref 0.00–0.07)
Basophils Absolute: 0.1 10*3/uL (ref 0.0–0.1)
Basophils Relative: 1 %
Eosinophils Absolute: 0 10*3/uL (ref 0.0–0.5)
Eosinophils Relative: 0 %
HCT: 36.9 % — ABNORMAL LOW (ref 39.0–52.0)
Hemoglobin: 13.3 g/dL (ref 13.0–17.0)
Immature Granulocytes: 1 %
Lymphocytes Relative: 13 %
Lymphs Abs: 1.1 10*3/uL (ref 0.7–4.0)
MCH: 38.8 pg — ABNORMAL HIGH (ref 26.0–34.0)
MCHC: 36 g/dL (ref 30.0–36.0)
MCV: 107.6 fL — ABNORMAL HIGH (ref 80.0–100.0)
Monocytes Absolute: 0.8 10*3/uL (ref 0.1–1.0)
Monocytes Relative: 10 %
Neutro Abs: 6.3 10*3/uL (ref 1.7–7.7)
Neutrophils Relative %: 75 %
Platelets: 128 10*3/uL — ABNORMAL LOW (ref 150–400)
RBC: 3.43 MIL/uL — ABNORMAL LOW (ref 4.22–5.81)
RDW: 16.4 % — ABNORMAL HIGH (ref 11.5–15.5)
WBC: 8.3 10*3/uL (ref 4.0–10.5)
nRBC: 0 % (ref 0.0–0.2)

## 2022-07-31 LAB — URINALYSIS, MICROSCOPIC (REFLEX)

## 2022-07-31 LAB — URINALYSIS, ROUTINE W REFLEX MICROSCOPIC
Glucose, UA: NEGATIVE mg/dL
Hgb urine dipstick: NEGATIVE
Ketones, ur: NEGATIVE mg/dL
Leukocytes,Ua: NEGATIVE
Nitrite: NEGATIVE
Protein, ur: 30 mg/dL — AB
Specific Gravity, Urine: 1.02 (ref 1.005–1.030)
pH: 6 (ref 5.0–8.0)

## 2022-07-31 LAB — LIPASE, BLOOD: Lipase: 23 U/L (ref 11–51)

## 2022-07-31 MED ORDER — ONDANSETRON 4 MG PO TBDP: 4.0000 mg | ORAL_TABLET | Freq: Three times a day (TID) | ORAL | 0 refills | Status: DC | PRN

## 2022-07-31 MED ORDER — IOHEXOL 300 MG/ML  SOLN
100.0000 mL | Freq: Once | INTRAMUSCULAR | Status: AC | PRN
  Administered 2022-07-31: 100 mL via INTRAVENOUS

## 2022-07-31 NOTE — Discharge Instructions (Signed)
Please read and follow all provided instructions.  Your diagnoses today include:  1. Alcoholic cirrhosis of liver with ascites ([place])   2. Liver mass   3. Nausea and vomiting, unspecified vomiting type     Tests performed today include: Blood cell counts and platelets Kidney and liver function tests: High liver function testing Pancreas function test (called lipase) Urine test to look for infection CT scan of the abdomen pelvis suggests cirrhosis of the liver with fluid that is accumulating inside the abdomen, you also have an area that is concerning for a liver mass Vital signs. See below for your results today.   Medications prescribed:  Zofran (ondansetron) - for nausea and vomiting  Take any prescribed medications only as directed.  Home care instructions:  Follow any educational materials contained in this packet. Please try to avoid alcohol and Tylenol to help to avoid stressing the liver further  Follow-up instructions: Hopefully the gastroenterologist group will call you on Monday for an appointment.  If you do not hear from them, please call the office number and let them know that you were seen in the ED.  Return instructions:  SEEK IMMEDIATE MEDICAL ATTENTION IF: The pain does not go away or becomes severe  A temperature above 101F develops  Repeated vomiting occurs (multiple episodes)  The pain becomes localized to portions of the abdomen. The right side could possibly be appendicitis. In an adult, the left lower portion of the abdomen could be colitis or diverticulitis.  Blood is being passed in stools or vomit (bright red or black tarry stools)  You develop chest pain, difficulty breathing, dizziness or fainting, or become confused, poorly responsive, or inconsolable (young children) If you have any other emergent concerns regarding your health  Additional Information: Abdominal (belly) pain can be caused by many things. Your caregiver performed an examination and  possibly ordered blood/urine tests and imaging (CT scan, x-rays, ultrasound). Many cases can be observed and treated at home after initial evaluation in the emergency department. Even though you are being discharged home, abdominal pain can be unpredictable. Therefore, you need a repeated exam if your pain does not resolve, returns, or worsens. Most patients with abdominal pain don't have to be admitted to the hospital or have surgery, but serious problems like appendicitis and gallbladder attacks can start out as nonspecific pain. Many abdominal conditions cannot be diagnosed in one visit, so follow-up evaluations are very important.  Your vital signs today were: BP (!) 134/98   Pulse 95   Temp 98.5 F (36.9 C) (Oral)   Resp 18   Ht 5\' 7"  (1.702 m)   SpO2 98%   BMI 25.84 kg/m  If your blood pressure (bp) was elevated above 135/85 this visit, please have this repeated by your doctor within one month. --------------

## 2022-07-31 NOTE — ED Triage Notes (Signed)
Pt c/o generalized abd pain and distention about 1.5 wks ago; reports passing small amt of stool

## 2022-07-31 NOTE — ED Provider Notes (Signed)
[facility] Provider Note   CSN: [id number] Arrival date & time: 07/31/22  1904     History  Chief Complaint  Patient presents with   Abdominal Pain    John Maxwell is a 42 y.o. male.  Patient with history of appendectomy presents to the emergency department for evaluation of abdominal pain and vomiting.  Patient has had distention and generalized abdominal discomfort over the past 1 and half weeks.  He has been constipated, only passing a small amount of stool.  He has had vomiting nearly every day which actually started several days before his abdominal symptoms.  He denies heavy NSAID use.  He does drink alcohol frequently.  No chest pain or shortness of breath.  No urinary symptoms.  Denies other abdominal surgeries.       Home Medications Prior to Admission medications   Medication Sig Start Date End Date Taking? Authorizing Provider  chlorhexidine (PERIDEX) 0.12 % solution Use as directed 15 mLs in the mouth or throat 2 (two) times daily. Swish and spit.  Do not swallow. 05/22/18   Langston Masker B, PA-C  ibuprofen (ADVIL) 800 MG tablet Take 1 tablet (800 mg total) by mouth 3 (three) times daily. 10/06/21   Sherrill Raring, PA-C  lidocaine (LIDODERM) 5 % Place 1 patch onto the skin daily. Remove & Discard patch within 12 hours or as directed by MD 06/23/21   Suella Broad A, PA-C  lidocaine (XYLOCAINE) 2 % solution Use as directed 15 mLs in the mouth or throat as needed for mouth pain. Swish and spit.  Do not swallow. 05/22/18   Langston Masker B, PA-C      Allergies    Flexeril [cyclobenzaprine]    Review of Systems   Review of Systems  Physical Exam Updated Vital Signs BP (!) 134/100   Pulse (!) 102   Temp 98.5 F (36.9 C) (Oral)   Resp 20   Ht 5\' 7"  (1.702 m)   SpO2 100%   BMI 25.84 kg/m   Physical Exam Vitals and nursing note reviewed.  Constitutional:      General: He is not in acute distress.    Appearance: He  is well-developed.  HENT:     Head: Normocephalic and atraumatic.  Eyes:     General:        Right eye: No discharge.        Left eye: No discharge.     Conjunctiva/sclera: Conjunctivae normal.  Cardiovascular:     Rate and Rhythm: Normal rate and regular rhythm.     Heart sounds: Normal heart sounds.  Pulmonary:     Effort: Pulmonary effort is normal.     Breath sounds: Normal breath sounds.  Abdominal:     Palpations: Abdomen is soft.     Tenderness: There is generalized abdominal tenderness (Mild tenderness without rebound or guarding). There is no guarding or rebound. Negative signs include Murphy's sign, Rovsing's sign and McBurney's sign.  Musculoskeletal:     Cervical back: Normal range of motion and neck supple.  Skin:    General: Skin is warm and dry.  Neurological:     Mental Status: He is alert.     ED Results / Procedures / Treatments   Labs (all labs ordered are listed, but only abnormal results are displayed) Labs Reviewed  CBC WITH DIFFERENTIAL/PLATELET - Abnormal; Notable for the following components:      Result Value   RBC 3.43 (*)  HCT 36.9 (*)    MCV 107.6 (*)    MCH 38.8 (*)    RDW 16.4 (*)    Platelets 128 (*)    All other components within normal limits  URINALYSIS, ROUTINE W REFLEX MICROSCOPIC - Abnormal; Notable for the following components:   Color, Urine AMBER (*)    Bilirubin Urine MODERATE (*)    Protein, ur 30 (*)    All other components within normal limits  URINALYSIS, MICROSCOPIC (REFLEX) - Abnormal; Notable for the following components:   Bacteria, UA MANY (*)    All other components within normal limits  COMPREHENSIVE METABOLIC PANEL - Abnormal; Notable for the following components:   Sodium 132 (*)    Potassium 3.4 (*)    Chloride 95 (*)    Glucose, Bld 137 (*)    BUN <5 (*)    Creatinine, Ser 0.59 (*)    Calcium 7.6 (*)    Albumin 2.8 (*)    AST 175 (*)    Alkaline Phosphatase 245 (*)    Total Bilirubin 2.6 (*)    All  other components within normal limits  LIPASE, BLOOD    EKG None  Radiology CT ABDOMEN PELVIS W CONTRAST  Result Date: 07/31/2022 CLINICAL DATA:  Abdominal pain, acute, nonlocalized. Pt c/o generalized abd pain and distention about 1.5 wks ago; reports passing small amt of stool EXAM: CT ABDOMEN AND PELVIS WITH CONTRAST TECHNIQUE: Multidetector CT imaging of the abdomen and pelvis was performed using the standard protocol following bolus administration of intravenous contrast. RADIATION DOSE REDUCTION: This exam was performed according to the departmental dose-optimization program which includes automated exposure control, adjustment of the mA and/or kV according to patient size and/or use of iterative reconstruction technique. CONTRAST:  171mL OMNIPAQUE IOHEXOL 300 MG/ML  SOLN COMPARISON:  None Available. FINDINGS: Lower chest: No acute abnormality. Hepatobiliary: Hepatic steatosis. Coarsened heterogeneous hepatic parenchyma with enlarged left hepatic lobe. Question poorly defined 4.1 cm juxta caval mass within the right hepatic lobe (2:12). Nonspecific hydropic gallbladder. No gallstones, gallbladder wall thickening, or pericholecystic fluid. No biliary dilatation. Pancreas: No focal lesion. Normal pancreatic contour. No surrounding inflammatory changes. No main pancreatic ductal dilatation. Spleen: Normal in size without focal abnormality. Adrenals/Urinary Tract: No adrenal nodule bilaterally. Bilateral kidneys enhance symmetrically. Punctate calcification within the right kidney. No left nephrolithiasis. No ureterolithiasis bilaterally. No hydronephrosis. No hydroureter. The urinary bladder is unremarkable. Stomach/Bowel: Stomach is within normal limits. No evidence of bowel wall thickening or dilatation. Fatty infiltration of the ascending colon wall suggestive of chronic inflammatory changes. Colonic diverticulosis. The appendix is not definitely identified with no inflammatory changes in the right  lower quadrant to suggest acute appendicitis. Vascular/Lymphatic: The portal, splenic, superior mesenteric veins are patent. Limited evaluation of the hepatic veins. Query mass effect of possible hepatic mass on to the inferior vena cava (5:45, 2:12). Recanalized paraumbilical vein. No abdominal aorta or iliac aneurysm. Mild atherosclerotic plaque of the aorta and its branches. No abdominal, pelvic, or inguinal lymphadenopathy. Reproductive: Status post hysterectomy. No adnexal masses. Other: At least small volume simple free fluid. No intraperitoneal free gas. No organized fluid collection. Musculoskeletal: IMPRESSION: 1. Hepatic steatosis with findings suggestive of cirrhotic morphology with portal hypertension. Question poorly defined 4.1 cm juxta caval mass within the right hepatic lobe-concern for hepatocellular carcinoma. When the patient is clinically stable and able to follow directions and hold their breath (preferably as an outpatient) further evaluation with dedicated MRI liver protocol is recommended. 2. At least small volume  simple ascites. 3. Nonspecific hydropic gallbladder. 4. Nonobstructive punctate right nephrolithiasis. 5. Colonic diverticulosis with no acute diverticulitis. 6.  Aortic Atherosclerosis (ICD10-I70.0). Electronically Signed   By: Tish Frederickson M.D.   On: 07/31/2022 22:14    Procedures Procedures    Medications Ordered in ED Medications  iohexol (OMNIPAQUE) 300 MG/ML solution 100 mL (100 mLs Intravenous Contrast Given 07/31/22 2203)    ED Course/ Medical Decision Making/ A&P    Patient seen and examined. History obtained directly from patient.   Labs/EKG: Ordered CBC, CMP, lipase, UA.  Imaging: Ordered CT abdomen pelvis with contrast.  Medications/Fluids: None ordered  Most recent vital signs reviewed and are as follows: BP (!) 134/100   Pulse (!) 102   Temp 98.5 F (36.9 C) (Oral)   Resp 20   Ht 5\' 7"  (1.702 m)   SpO2 100%   BMI 25.84 kg/m   Initial  impression: Abdominal tenderness with reported distention, vomiting, constipation.  Will rule out bowel obstruction.  Will evaluate for ascites.  10:51 PM Reassessment performed. Patient appears stable.  He is not vomiting.  Labs personally reviewed and interpreted including: CBC with normal white blood cell count, normal hemoglobin, elevated MCV;  CMP with hyponatremia, hypokalemia, hypochloremia, BUN less than 5 and creatinine 0.59, elevated AST at 175, normal ALT, alk phos 245 with total bilirubin 2.6; lipase normal.   Imaging personally visualized and interpreted including: CT imaging of the abdomen and pelvis demonstrates ascites, agree appearance concerning for liver mass as well as cirrhosis.  Reviewed pertinent lab work and imaging with patient at bedside. Questions answered.   Most current vital signs reviewed and are as follows: BP (!) 134/98   Pulse 95   Temp 98.5 F (36.9 C) (Oral)   Resp 18   Ht 5\' 7"  (1.702 m)   SpO2 98%   BMI 25.84 kg/m   Plan: I sent a secure chat message and spoke with Dr. of gastroenterology asking to help facilitate follow-up in the office.  He states that he will ask the nurse to reach out on Monday to schedule a follow-up appointment.  Patient will also be provided with contact information and is instructed to call if he does not hear from them on Monday.  I have also placed a TOC request for PCP follow-up.  Patient will need this due to new diagnosis of liver disease.  Plan: Discharge to home.   Prescriptions written for: Zofran  Other home care instructions discussed: Avoidance of alcohol, avoiding Tylenol, bland diet.  ED return instructions discussed: The patient was urged to return to the Emergency Department immediately with worsening of current symptoms, worsening abdominal pain, persistent vomiting, blood noted in stools, fever, or any other concerns. The patient verbalized understanding.   Follow-up instructions discussed: Patient  encouraged to follow-up with their PCP as soon as possible as well as GI.  Attempting to facilitate follow-up as mentioned above.                             Medical Decision Making Amount and/or Complexity of Data Reviewed Labs: ordered. Radiology: ordered.  Risk Prescription drug management.   For this patient's complaint of abdominal pain, the following conditions were considered on the differential diagnosis: gastritis/PUD, enteritis/duodenitis, appendicitis, cholelithiasis/cholecystitis, cholangitis, pancreatitis, ruptured viscus, colitis, diverticulitis, small/large bowel obstruction, proctitis, cystitis, pyelonephritis, ureteral colic, aortic dissection, aortic aneurysm. Atypical chest etiologies were also considered including ACS, PE, and pneumonia.  Patient  has evidence of cirrhosis and accumulating ascites, although I do not feel that this is severe enough to require admission for paracentesis today.  Patient is not febrile and does not have signs of peritonitis.  He will be discharged with symptom control and close outpatient follow-up.  Upon talking with him at bedside, he seems motivated to follow-up.  The patient's vital signs, pertinent lab work and imaging were reviewed and interpreted as discussed in the ED course. Hospitalization was considered for further testing, treatments, or serial exams/observation. However as patient is well-appearing, has a stable exam, and reassuring studies today, I do not feel that they warrant admission at this time. This plan was discussed with the patient who verbalizes agreement and comfort with this plan and seems reliable and able to return to the Emergency Department with worsening or changing symptoms.           Final Clinical Impression(s) / ED Diagnoses Final diagnoses:  Alcoholic cirrhosis of liver with ascites (HCC)  Liver mass  Nausea and vomiting, unspecified vomiting type    Rx / DC Orders ED Discharge Orders           Ordered    ondansetron (ZOFRAN-ODT) 4 MG disintegrating tablet  Every 8 hours PRN        07/31/22 2248              Carlisle Cater, PA-C 07/31/22 2255    Fredia Sorrow, MD 08/02/22 [phone]

## 2022-07-31 NOTE — ED Notes (Signed)
Patient transported to CT 

## 2022-08-04 ENCOUNTER — Other Ambulatory Visit (HOSPITAL_BASED_OUTPATIENT_CLINIC_OR_DEPARTMENT_OTHER): Payer: Self-pay | Admitting: Internal Medicine

## 2022-08-04 DIAGNOSIS — F1090 Alcohol use, unspecified, uncomplicated: Secondary | ICD-10-CM

## 2022-08-04 DIAGNOSIS — R9389 Abnormal findings on diagnostic imaging of other specified body structures: Secondary | ICD-10-CM | POA: Diagnosis not present

## 2022-08-04 DIAGNOSIS — R188 Other ascites: Secondary | ICD-10-CM | POA: Diagnosis not present

## 2022-08-04 DIAGNOSIS — K7030 Alcoholic cirrhosis of liver without ascites: Secondary | ICD-10-CM | POA: Diagnosis not present

## 2022-08-09 ENCOUNTER — Telehealth (HOSPITAL_BASED_OUTPATIENT_CLINIC_OR_DEPARTMENT_OTHER): Payer: Self-pay

## 2022-08-18 ENCOUNTER — Inpatient Hospital Stay (HOSPITAL_COMMUNITY)
Admission: RE | Admit: 2022-08-18 | Discharge: 2022-08-18 | Disposition: A | Discharge status: Home / Self Care | Payer: BC Managed Care – PPO | Source: Ambulatory Visit | Attending: Internal Medicine | Admitting: Internal Medicine

## 2022-08-18 DIAGNOSIS — R9389 Abnormal findings on diagnostic imaging of other specified body structures: Secondary | ICD-10-CM | POA: Insufficient documentation

## 2022-08-18 DIAGNOSIS — K7040 Alcoholic hepatic failure without coma: Secondary | ICD-10-CM | POA: Diagnosis not present

## 2022-08-18 DIAGNOSIS — F1090 Alcohol use, unspecified, uncomplicated: Secondary | ICD-10-CM | POA: Insufficient documentation

## 2022-08-18 DIAGNOSIS — F10939 Alcohol use, unspecified with withdrawal, unspecified: Secondary | ICD-10-CM | POA: Diagnosis not present

## 2022-08-18 DIAGNOSIS — E8770 Fluid overload, unspecified: Secondary | ICD-10-CM | POA: Diagnosis not present

## 2022-08-18 DIAGNOSIS — R140 Abdominal distension (gaseous): Secondary | ICD-10-CM | POA: Diagnosis not present

## 2022-08-18 DIAGNOSIS — R188 Other ascites: Secondary | ICD-10-CM | POA: Diagnosis not present

## 2022-08-18 DIAGNOSIS — Z818 Family history of other mental and behavioral disorders: Secondary | ICD-10-CM | POA: Diagnosis not present

## 2022-08-18 DIAGNOSIS — Z888 Allergy status to other drugs, medicaments and biological substances status: Secondary | ICD-10-CM | POA: Diagnosis not present

## 2022-08-18 DIAGNOSIS — Z87891 Personal history of nicotine dependence: Secondary | ICD-10-CM | POA: Diagnosis not present

## 2022-08-18 DIAGNOSIS — K760 Fatty (change of) liver, not elsewhere classified: Secondary | ICD-10-CM | POA: Diagnosis not present

## 2022-08-18 DIAGNOSIS — R932 Abnormal findings on diagnostic imaging of liver and biliary tract: Secondary | ICD-10-CM | POA: Diagnosis not present

## 2022-08-18 DIAGNOSIS — D649 Anemia, unspecified: Secondary | ICD-10-CM | POA: Diagnosis not present

## 2022-08-18 DIAGNOSIS — K7031 Alcoholic cirrhosis of liver with ascites: Secondary | ICD-10-CM | POA: Diagnosis not present

## 2022-08-18 DIAGNOSIS — E871 Hypo-osmolality and hyponatremia: Secondary | ICD-10-CM | POA: Diagnosis not present

## 2022-08-18 DIAGNOSIS — D539 Nutritional anemia, unspecified: Secondary | ICD-10-CM | POA: Diagnosis not present

## 2022-08-18 DIAGNOSIS — K7469 Other cirrhosis of liver: Secondary | ICD-10-CM | POA: Diagnosis not present

## 2022-08-18 DIAGNOSIS — F10239 Alcohol dependence with withdrawal, unspecified: Secondary | ICD-10-CM | POA: Diagnosis not present

## 2022-08-18 DIAGNOSIS — R945 Abnormal results of liver function studies: Secondary | ICD-10-CM | POA: Diagnosis not present

## 2022-08-18 DIAGNOSIS — R791 Abnormal coagulation profile: Secondary | ICD-10-CM | POA: Diagnosis not present

## 2022-08-18 DIAGNOSIS — K7460 Unspecified cirrhosis of liver: Secondary | ICD-10-CM | POA: Diagnosis not present

## 2022-08-18 DIAGNOSIS — E663 Overweight: Secondary | ICD-10-CM | POA: Diagnosis not present

## 2022-08-18 DIAGNOSIS — F32A Depression, unspecified: Secondary | ICD-10-CM | POA: Diagnosis not present

## 2022-08-18 DIAGNOSIS — R569 Unspecified convulsions: Secondary | ICD-10-CM | POA: Diagnosis not present

## 2022-08-18 DIAGNOSIS — K7290 Hepatic failure, unspecified without coma: Secondary | ICD-10-CM | POA: Diagnosis not present

## 2022-08-18 DIAGNOSIS — Z79899 Other long term (current) drug therapy: Secondary | ICD-10-CM | POA: Diagnosis not present

## 2022-08-18 DIAGNOSIS — K766 Portal hypertension: Secondary | ICD-10-CM | POA: Diagnosis not present

## 2022-08-18 DIAGNOSIS — Z119 Encounter for screening for infectious and parasitic diseases, unspecified: Secondary | ICD-10-CM | POA: Diagnosis not present

## 2022-08-18 DIAGNOSIS — E538 Deficiency of other specified B group vitamins: Secondary | ICD-10-CM | POA: Diagnosis not present

## 2022-08-18 DIAGNOSIS — K7682 Hepatic encephalopathy: Secondary | ICD-10-CM | POA: Diagnosis not present

## 2022-08-18 DIAGNOSIS — E876 Hypokalemia: Secondary | ICD-10-CM | POA: Diagnosis not present

## 2022-08-18 DIAGNOSIS — F10229 Alcohol dependence with intoxication, unspecified: Secondary | ICD-10-CM | POA: Diagnosis not present

## 2022-08-18 DIAGNOSIS — Z8505 Personal history of malignant neoplasm of liver: Secondary | ICD-10-CM | POA: Diagnosis not present

## 2022-08-18 DIAGNOSIS — R160 Hepatomegaly, not elsewhere classified: Secondary | ICD-10-CM | POA: Diagnosis not present

## 2022-08-18 DIAGNOSIS — D6959 Other secondary thrombocytopenia: Secondary | ICD-10-CM | POA: Diagnosis not present

## 2022-08-19 ENCOUNTER — Encounter: Payer: Self-pay | Admitting: Internal Medicine

## 2022-08-19 ENCOUNTER — Ambulatory Visit (INDEPENDENT_AMBULATORY_CARE_PROVIDER_SITE_OTHER): Payer: BC Managed Care – PPO | Admitting: Internal Medicine

## 2022-08-19 VITALS — BP 110/72 | HR 105 | Temp 98.4°F | Ht 67.0 in | Wt 186.2 lb

## 2022-08-19 DIAGNOSIS — R160 Hepatomegaly, not elsewhere classified: Secondary | ICD-10-CM

## 2022-08-19 DIAGNOSIS — K7290 Hepatic failure, unspecified without coma: Secondary | ICD-10-CM

## 2022-08-19 DIAGNOSIS — E663 Overweight: Secondary | ICD-10-CM | POA: Diagnosis not present

## 2022-08-19 DIAGNOSIS — F1090 Alcohol use, unspecified, uncomplicated: Secondary | ICD-10-CM | POA: Diagnosis not present

## 2022-08-19 DIAGNOSIS — Z119 Encounter for screening for infectious and parasitic diseases, unspecified: Secondary | ICD-10-CM

## 2022-08-19 DIAGNOSIS — F410 Panic disorder [episodic paroxysmal anxiety] without agoraphobia: Secondary | ICD-10-CM

## 2022-08-19 DIAGNOSIS — K7030 Alcoholic cirrhosis of liver without ascites: Secondary | ICD-10-CM

## 2022-08-19 DIAGNOSIS — F10931 Alcohol use, unspecified with withdrawal delirium: Secondary | ICD-10-CM

## 2022-08-19 LAB — LIPID PANEL
Cholesterol: 128 mg/dL (ref 0–200)
HDL: 27.7 mg/dL — ABNORMAL LOW (ref >39.00)
LDL Cholesterol: 81 mg/dL (ref 0–99)
NonHDL: 99.98
Total CHOL/HDL Ratio: 5
Triglycerides: 96 mg/dL (ref 0.0–149.0)
VLDL: 19.2 mg/dL (ref 0.0–40.0)

## 2022-08-19 LAB — TSH: TSH: 4.95 u[IU]/mL (ref 0.35–5.50)

## 2022-08-19 LAB — HEMOGLOBIN A1C: Hgb A1c MFr Bld: 4.3 % — ABNORMAL LOW (ref 4.6–6.5)

## 2022-08-19 MED ORDER — NALTREXONE HCL 50 MG PO TABS: 50.0000 mg | ORAL_TABLET | Freq: Every day | ORAL | 3 refills | Status: DC

## 2022-08-19 MED ORDER — ACAMPROSATE CALCIUM 333 MG PO TBEC: 666.0000 mg | DELAYED_RELEASE_TABLET | Freq: Three times a day (TID) | ORAL | 11 refills | Status: DC

## 2022-08-19 MED ORDER — OXAZEPAM 10 MG PO CAPS: 10.0000 mg | ORAL_CAPSULE | Freq: Two times a day (BID) | ORAL | 0 refills | Status: DC | PRN

## 2022-08-19 NOTE — Progress Notes (Signed)
[place]  Phone: [phone]  New patient visit  Visit Date: 08/19/2022 Patient: John Maxwell   DOB: 1981-02-04   42 y.o. Male  MRN: [id number]  Today's healthcare provider: Loralee Pacas, MD  Assessment and Plan:   Kanyen was seen today for new patient (initial visit), alcohol addiction, possible cirrhosis of the liver and bloated.  Alcohol use disorder -     Acamprosate Calcium; Take 2 tablets (666 mg total) by mouth 3 (three) times daily with meals.  Dispense: 180 tablet; Refill: 11 -     Naltrexone HCl; Take 1 tablet (50 mg total) by mouth daily.  Dispense: 90 tablet; Refill: 3 -     CBC; Future -     Comprehensive metabolic panel; Future -     Protime-INR -     Bilirubin, fractionated(tot/dir/indir) -     AFP tumor marker -     Oxazepam; Take 1 capsule (10 mg total) by mouth 3 times/day as needed-between meals & bedtime for sleep or anxiety. Do not mix with alcohol.  Dispense: 10 capsule; Refill: 0  Liver mass Overview: 4.1 cm mass seen on CT  Assessment & Plan: Will order dedicated MR liver follow up as recommended by radiologist Martin Majestic over the risk that this mass represents- he was not aware of it, most likely due to memory impacted by recent delirium tremens in alcohol withdrawal syndrome    Orders: -     MR LIVER W WO CONTRAST; Future -     AFP tumor marker  Screening examination for infectious disease  Overweight Overview: Appears ascitic / cirrhotic  Orders: -     Lipid panel -     TSH -     Hemoglobin A1c  Panic attack -     Ambulatory referral to Psychiatry -     Oxazepam; Take 1 capsule (10 mg total) by mouth 3 times/day as needed-between meals & bedtime for sleep or anxiety. Do not mix with alcohol.  Dispense: 10 capsule; Refill: 0  Alcoholic cirrhosis, unspecified whether ascites present [facility]) Overview: Demonstrated on CT abdomen 07/2022  Assessment & Plan: Encouraged discontinuing alcohol Treated alcohol use  disorder  Advised follow up with gastroenterology already referred     Delirium tremens [facility])  Liver failure without hepatic coma, unspecified chronicity ([place]) Overview:    Latest Ref Rng & Units 07/31/2022    7:22 PM  Hepatic Function  Total Protein 6.5 - 8.1 g/dL 6.9   Albumin 3.5 - 5.0 g/dL 2.8   AST 15 - 41 U/L 175   ALT 0 - 44 U/L 37   Alk Phosphatase 38 - 126 U/L 245   Total Bilirubin 0.3 - 1.2 mg/dL 2.6      Assessment & Plan: Duration unclear- presented jaundiced, still drinking alcohol, after hospitalization for alcohol withdrawal syndrome  Explained it is urgent that he discontinue alcohol Prescribed serax has outpatient due to the urgency of discontinuing alcohol, since it won't have liver metabolism, and he needs something for panic disorder(s) aggravated by alcohol withdrawal syndrome as he attempts to discontinue alcohol in the setting of possible hepatocellular carcinoma   Orders: -     CBC; Future -     Comprehensive metabolic panel; Future -     Protime-INR -     Bilirubin, fractionated(tot/dir/indir) -     AFP tumor marker  Panic disorder    Patients chart review and interview were used to generate a prompt for artificial intelligence analysis Engineering geologist  artificial intelligence) clinical decision support.  AI output was reviewed and is provided in red:  Comprehensive Review of the Case: A 42 year old male with a recent hospitalization for delirium tremens and newly diagnosed liver cirrhosis and a 4.1 cm liver mass concerning for hepatocellular carcinoma presents seeking help to quit drinking. Despite recent detoxification, he has consumed a few drinks daily since discharge. His complete blood count (CBC) is normal, but his comprehensive metabolic panel (CMP) reveals low calcium, low potassium, elevated aspartate aminotransferase (AST) around 200 U/L, alanine aminotransferase (ALT) 50 U/L, and bilirubin of 2.6 mg/dL. No international normalized ratio (INR) or  additional lab work is available. The patient quit smoking six years ago and reports frequent panic attacks. This summary includes all available demographic details, symptom information, site of care, patient history, physical exam findings, medications, and diagnostic study results. It lacks specific information on geographic region, travel history, and family medical history.  Clinical Problem Representation: A 42 year old male with recent delirium tremens, liver cirrhosis, and a liver mass suggestive of hepatocellular carcinoma, currently consuming alcohol, presents with biochemical evidence of liver dysfunction and frequent panic attacks. The patient seeks assistance with alcohol cessation.   Most Likely Diagnoses - Hepatocellular carcinoma ([place]): The presence of a 4.1 cm liver mass in a patient with chronic liver disease and cirrhosis is highly suggestive of [place]. The elevated AST and bilirubin levels further support liver dysfunction, which is common in [facility]. The recent history of alcohol use could have exacerbated underlying liver disease, contributing to the development of [place].    - Alcohol withdrawal syndrome (AWS): The patient's recent hospitalization for delirium tremens, a severe form of AWS, along with ongoing alcohol consumption, suggests a high risk for recurrent AWS. The frequent panic attacks could be a manifestation of AWS or a comorbid anxiety disorder exacerbated by alcohol use.    - Cirrhosis with portal hypertension: The patient's liver cirrhosis diagnosis, elevated liver enzymes, and low albumin suggest ongoing liver damage and dysfunction. The low calcium and potassium levels may be related to malnutrition or specific deficiencies associated with chronic liver disease. Portal hypertension could be a contributing factor to the patient's overall condition, although specific signs such as ascites or varices are not mentioned.   Expanded Differential Diagnoses - Alcoholic liver disease  (ALD): Given the patient's history of alcohol use, ALD leading to cirrhosis and potentially contributing to the development of [place] is a primary concern. The pattern of liver enzyme elevation (AST > ALT) is typical for ALD.    - Anxiety disorder: The frequent panic attacks could indicate an underlying anxiety disorder, which may be exacerbated by alcohol use and withdrawal, as well as the stress of recent medical diagnoses.    - Hypokalemia and hypocalcemia: These electrolyte abnormalities could be secondary to malnutrition, alcohol use disorder, or cirrhosis. They require further evaluation to determine the underlying cause and appropriate management.   Can't Miss Differential Diagnosis - Acute liver failure: While the patient's presentation is more indicative of chronic liver disease, acute decompensation cannot be ruled out, especially if there is a sudden worsening of liver function tests or development of encephalopathy.    - Variceal bleeding: In the context of cirrhosis and portal hypertension, variceal bleeding is a life-threatening complication that requires immediate attention.    - Spontaneous bacterial peritonitis (SBP): Although not directly indicated by the provided information, SBP is a serious complication of cirrhosis that can present subtly and worsen rapidly.   Subjective:  Patient presents today to establish  care.  Chief Complaint  Patient presents with   New Patient (Initial Visit)   Alcohol addiction   Possible cirrhosis of the liver    Liver U/S done yesterday findings showed compatibility with cirrhosis of the liver.   Bloated    Constant, started about two weeks ago, feels pressure on organs that is painful.    He presents wanting help to stop drinking, still drinking after recent hospitalization   He denies blood in stool, passing out or fevers or any abdominal pain... except some abdominal bloating  Hospital records reviewed. Problem-oriented charting was used  to develop and update his medical history: Problem  Liver Mass   4.1 cm mass seen on CT   Alcohol Use Disorder  Overweight   Appears ascitic / cirrhotic   Panic Attack  Alcoholic Cirrhosis of Liver (Hcc)   Demonstrated on CT abdomen 07/2022   Liver Failure Without Hepatic Coma ([facility])      Latest Ref Rng & Units 07/31/2022    7:22 PM  Hepatic Function  Total Protein 6.5 - 8.1 g/dL 6.9   Albumin 3.5 - 5.0 g/dL 2.8   AST 15 - 41 U/L 175   ALT 0 - 44 U/L 37   Alk Phosphatase 38 - 126 U/L 245   Total Bilirubin 0.3 - 1.2 mg/dL 2.6       Delirium Tremens (Hcc) (Resolved)     Depression Screen    08/19/2022   11:09 AM  PHQ 2/9 Scores  PHQ - 2 Score 3  PHQ- 9 Score 11   Results for orders placed or performed in visit on 08/19/22  Lipid panel  Result Value Ref Range   Cholesterol 128 0 - 200 mg/dL   Triglycerides 96.0 0.0 - 149.0 mg/dL   HDL 27.70 (L) >39.00 mg/dL   VLDL 19.2 0.0 - 40.0 mg/dL   LDL Cholesterol 81 0 - 99 mg/dL   Total CHOL/HDL Ratio 5    NonHDL 99.98   TSH  Result Value Ref Range   TSH 4.95 0.35 - 5.50 uIU/mL  HgB A1c  Result Value Ref Range   Hgb A1c MFr Bld 4.3 (L) 4.6 - 6.5 %    The following were reviewed and entered/updated into his MEDICAL RECORD NUMBERPast Medical History:  Diagnosis Date   Alcoholic cirrhosis of liver ([place]) 08/20/2022   Demonstrated on CT abdomen 07/2022   Anxiety    Delirium tremens ([place]) 08/20/2022   Depression    Liver failure without hepatic coma ([place]) 08/20/2022   He presents jaundiced   Substance abuse ([place])    Past Surgical History:  Procedure Laterality Date   APPENDECTOMY     Family History  Problem Relation Age of Onset   Heart disease Father    Diabetes Father    Depression Father    Heart attack Father    Mental illness Father    Outpatient Medications Prior to Visit  Medication Sig Dispense Refill   chlorhexidine (PERIDEX) 0.12 % solution Use as directed 15 mLs in the mouth or throat 2 (two) times daily.  Swish and spit.  Do not swallow. 120 mL 0   furosemide (LASIX) 20 MG tablet Take 20 mg by mouth daily.     ibuprofen (ADVIL) 800 MG tablet Take 1 tablet (800 mg total) by mouth 3 (three) times daily. 21 tablet 0   lidocaine (LIDODERM) 5 % Place 1 patch onto the skin daily. Remove & Discard patch within 12 hours or as directed by MD  30 patch 0   lidocaine (XYLOCAINE) 2 % solution Use as directed 15 mLs in the mouth or throat as needed for mouth pain. Swish and spit.  Do not swallow. 100 mL 0   ondansetron (ZOFRAN-ODT) 4 MG disintegrating tablet Take 1 tablet (4 mg total) by mouth every 8 (eight) hours as needed for nausea or vomiting. 10 tablet 0   spironolactone (ALDACTONE) 50 MG tablet Take 50 mg by mouth daily.     No facility-administered medications prior to visit.    Allergies  Allergen Reactions   Flexeril [Cyclobenzaprine] Anaphylaxis   Social History   Tobacco Use   Smoking status: Former    Types: Cigarettes   Smokeless tobacco: Never  Vaping Use   Vaping Use: Never used  Substance Use Topics   Alcohol use: Yes    Comment: daily   Drug use: No    Immunization History  Administered Date(s) Administered   Tdap 02/23/2018, 12/05/2020    Objective:  BP 110/72 (BP Location: Left Arm, Patient Position: Sitting)   Pulse (!) 105   Temp 98.4 F (36.9 C) (Temporal)   Ht '5\' 7"'$  (1.702 m)   Wt 186 lb 3.2 oz (84.5 kg)   SpO2 97%   BMI 29.16 kg/m  His Body mass index is 29.16 kg/m. indicates that he is  Overweight male , but waist circumference (truncal adiposity) is a better indicator of healthy body composition. He has  severity: moderate truncal adiposity in my medical opinion.  Vital signs reviewed.  Physical Exam  Wt Readings from Last 10 Encounters:  08/19/22 186 lb 3.2 oz (84.5 kg)  06/23/21 165 lb (74.8 kg)  05/22/18 180 lb (81.6 kg)  02/23/18 165 lb (74.8 kg)   Weight trend (if available above) reviewed. General Appearance/Constitutional:  polite male in no  acute distress Musculoskeletal: All extremities are intact.  Neurological:  Awake, alert,  No obvious focal neurological deficits or cognitive impairments Psychiatric:  Appropriate mood, pleasant demeanor Problem-specific findings:  jaundiced, protuberant abdomen,muscle wasting arms,    Results Reviewed: Results for orders placed or performed in visit on 08/19/22  Lipid panel  Result Value Ref Range   Cholesterol 128 0 - 200 mg/dL   Triglycerides 96.0 0.0 - 149.0 mg/dL   HDL 27.70 (L) >39.00 mg/dL   VLDL 19.2 0.0 - 40.0 mg/dL   LDL Cholesterol 81 0 - 99 mg/dL   Total CHOL/HDL Ratio 5    NonHDL 99.98   TSH  Result Value Ref Range   TSH 4.95 0.35 - 5.50 uIU/mL  HgB A1c  Result Value Ref Range   Hgb A1c MFr Bld 4.3 (L) 4.6 - 6.5 %

## 2022-08-19 NOTE — Progress Notes (Signed)
Low Hemoglobin A1c and low HDL likely due to advanced liver disease.  We should use this as motivation to stop drinking immediately.... risk of imminent liver failure.

## 2022-08-20 ENCOUNTER — Encounter: Payer: Self-pay | Admitting: Internal Medicine

## 2022-08-20 ENCOUNTER — Telehealth: Payer: Self-pay | Admitting: Internal Medicine

## 2022-08-20 DIAGNOSIS — F410 Panic disorder [episodic paroxysmal anxiety] without agoraphobia: Secondary | ICD-10-CM

## 2022-08-20 DIAGNOSIS — F1090 Alcohol use, unspecified, uncomplicated: Secondary | ICD-10-CM | POA: Insufficient documentation

## 2022-08-20 DIAGNOSIS — E663 Overweight: Secondary | ICD-10-CM

## 2022-08-20 DIAGNOSIS — F10931 Alcohol use, unspecified with withdrawal delirium: Secondary | ICD-10-CM

## 2022-08-20 DIAGNOSIS — R160 Hepatomegaly, not elsewhere classified: Secondary | ICD-10-CM

## 2022-08-20 DIAGNOSIS — K7030 Alcoholic cirrhosis of liver without ascites: Secondary | ICD-10-CM | POA: Insufficient documentation

## 2022-08-20 DIAGNOSIS — K7290 Hepatic failure, unspecified without coma: Secondary | ICD-10-CM | POA: Insufficient documentation

## 2022-08-20 HISTORY — DX: Overweight: E66.3

## 2022-08-20 HISTORY — DX: Alcoholic cirrhosis of liver without ascites: K70.30

## 2022-08-20 HISTORY — DX: Panic disorder (episodic paroxysmal anxiety): F41.0

## 2022-08-20 HISTORY — DX: Alcohol use, unspecified with withdrawal delirium: F10.931

## 2022-08-20 HISTORY — DX: Hepatomegaly, not elsewhere classified: R16.0

## 2022-08-20 HISTORY — DX: Hepatic failure, unspecified without coma: K72.90

## 2022-08-20 LAB — COMPREHENSIVE METABOLIC PANEL
ALT: 56 U/L — ABNORMAL HIGH (ref 0–53)
AST: 232 U/L — ABNORMAL HIGH (ref 0–37)
Albumin: 3 g/dL — ABNORMAL LOW (ref 3.5–5.2)
Alkaline Phosphatase: 295 U/L — ABNORMAL HIGH (ref 39–117)
BUN: 6 mg/dL (ref 6–23)
CO2: 33 mEq/L — ABNORMAL HIGH (ref 19–32)
Calcium: 8.2 mg/dL — ABNORMAL LOW (ref 8.4–10.5)
Chloride: 87 mEq/L — ABNORMAL LOW (ref 96–112)
Creatinine, Ser: 0.71 mg/dL (ref 0.40–1.50)
GFR: 115.78 mL/min (ref >60.00)
Glucose, Bld: 95 mg/dL (ref 70–99)
Potassium: 3.4 mEq/L — ABNORMAL LOW (ref 3.5–5.1)
Sodium: 134 mEq/L — ABNORMAL LOW (ref 135–145)
Total Bilirubin: 3.6 mg/dL — ABNORMAL HIGH (ref 0.2–1.2)
Total Protein: 6.5 g/dL (ref 6.0–8.3)

## 2022-08-20 LAB — CBC
HCT: 36.9 % — ABNORMAL LOW (ref 39.0–52.0)
Hemoglobin: 12.7 g/dL — ABNORMAL LOW (ref 13.0–17.0)
MCHC: 34.5 g/dL (ref 30.0–36.0)
MCV: 117.5 fl — ABNORMAL HIGH (ref 78.0–100.0)
Platelets: 150 10*3/uL (ref 150.0–400.0)
RBC: 3.14 Mil/uL — ABNORMAL LOW (ref 4.22–5.81)
RDW: 15.7 % — ABNORMAL HIGH (ref 11.5–15.5)
WBC: 9.3 10*3/uL (ref 4.0–10.5)

## 2022-08-20 NOTE — Telephone Encounter (Signed)
Called and was unable to leave a vm, as it was full. Sent a my chart message to patient concerning this.

## 2022-08-20 NOTE — Assessment & Plan Note (Signed)
Duration unclear- presented jaundiced, still drinking alcohol, after hospitalization for alcohol withdrawal syndrome  Explained it is urgent that he discontinue alcohol Prescribed serax has outpatient due to the urgency of discontinuing alcohol, since it won't have liver metabolism, and he needs something for panic disorder(s) aggravated by alcohol withdrawal syndrome as he attempts to discontinue alcohol in the setting of possible hepatocellular carcinoma

## 2022-08-20 NOTE — Assessment & Plan Note (Signed)
Encouraged discontinuing alcohol Treated alcohol use disorder  Advised follow up with gastroenterology already referred

## 2022-08-20 NOTE — Assessment & Plan Note (Signed)
Will order dedicated MR liver follow up as recommended by radiologist Martin Majestic over the risk that this mass represents- he was not aware of it, most likely due to memory impacted by recent delirium tremens in alcohol withdrawal syndrome

## 2022-08-20 NOTE — Telephone Encounter (Signed)
Patient states: - He was only able to pick up acamprosate from [facility] informed him that Naltrexone was on back order until about April 2024 and oxazepam was out of stock; They didn't elaborate further   Please Advise.

## 2022-08-21 ENCOUNTER — Emergency Department (HOSPITAL_BASED_OUTPATIENT_CLINIC_OR_DEPARTMENT_OTHER): Payer: BC Managed Care – PPO

## 2022-08-21 ENCOUNTER — Inpatient Hospital Stay (HOSPITAL_BASED_OUTPATIENT_CLINIC_OR_DEPARTMENT_OTHER)
Admission: EM | Admit: 2022-08-21 | Discharge: 2022-08-25 | DRG: 897 | Disposition: A | Discharge status: Home / Self Care | Payer: BC Managed Care – PPO | Attending: Family Medicine | Admitting: Family Medicine

## 2022-08-21 ENCOUNTER — Encounter (HOSPITAL_BASED_OUTPATIENT_CLINIC_OR_DEPARTMENT_OTHER): Payer: Self-pay | Admitting: Emergency Medicine

## 2022-08-21 ENCOUNTER — Other Ambulatory Visit: Payer: Self-pay

## 2022-08-21 DIAGNOSIS — K7290 Hepatic failure, unspecified without coma: Secondary | ICD-10-CM | POA: Diagnosis present

## 2022-08-21 DIAGNOSIS — T50905A Adverse effect of unspecified drugs, medicaments and biological substances, initial encounter: Secondary | ICD-10-CM

## 2022-08-21 DIAGNOSIS — F10251 Alcohol dependence with alcohol-induced psychotic disorder with hallucinations: Secondary | ICD-10-CM

## 2022-08-21 DIAGNOSIS — Z888 Allergy status to other drugs, medicaments and biological substances status: Secondary | ICD-10-CM | POA: Diagnosis not present

## 2022-08-21 DIAGNOSIS — R791 Abnormal coagulation profile: Secondary | ICD-10-CM | POA: Diagnosis present

## 2022-08-21 DIAGNOSIS — K760 Fatty (change of) liver, not elsewhere classified: Secondary | ICD-10-CM | POA: Diagnosis present

## 2022-08-21 DIAGNOSIS — F10239 Alcohol dependence with withdrawal, unspecified: Principal | ICD-10-CM | POA: Diagnosis present

## 2022-08-21 DIAGNOSIS — Z8505 Personal history of malignant neoplasm of liver: Secondary | ICD-10-CM

## 2022-08-21 DIAGNOSIS — Z79899 Other long term (current) drug therapy: Secondary | ICD-10-CM

## 2022-08-21 DIAGNOSIS — F10939 Alcohol use, unspecified with withdrawal, unspecified: Secondary | ICD-10-CM | POA: Diagnosis present

## 2022-08-21 DIAGNOSIS — F32A Depression, unspecified: Secondary | ICD-10-CM | POA: Diagnosis present

## 2022-08-21 DIAGNOSIS — K7682 Hepatic encephalopathy: Secondary | ICD-10-CM | POA: Diagnosis present

## 2022-08-21 DIAGNOSIS — K7040 Alcoholic hepatic failure without coma: Secondary | ICD-10-CM | POA: Diagnosis present

## 2022-08-21 DIAGNOSIS — Z818 Family history of other mental and behavioral disorders: Secondary | ICD-10-CM | POA: Diagnosis not present

## 2022-08-21 DIAGNOSIS — E876 Hypokalemia: Secondary | ICD-10-CM | POA: Diagnosis present

## 2022-08-21 DIAGNOSIS — D539 Nutritional anemia, unspecified: Secondary | ICD-10-CM | POA: Diagnosis present

## 2022-08-21 DIAGNOSIS — D649 Anemia, unspecified: Secondary | ICD-10-CM | POA: Diagnosis not present

## 2022-08-21 DIAGNOSIS — F1090 Alcohol use, unspecified, uncomplicated: Secondary | ICD-10-CM

## 2022-08-21 DIAGNOSIS — D6959 Other secondary thrombocytopenia: Secondary | ICD-10-CM | POA: Diagnosis present

## 2022-08-21 DIAGNOSIS — E871 Hypo-osmolality and hyponatremia: Secondary | ICD-10-CM

## 2022-08-21 DIAGNOSIS — R569 Unspecified convulsions: Secondary | ICD-10-CM | POA: Diagnosis present

## 2022-08-21 DIAGNOSIS — E8770 Fluid overload, unspecified: Secondary | ICD-10-CM | POA: Diagnosis present

## 2022-08-21 DIAGNOSIS — Z87891 Personal history of nicotine dependence: Secondary | ICD-10-CM | POA: Diagnosis not present

## 2022-08-21 DIAGNOSIS — E538 Deficiency of other specified B group vitamins: Secondary | ICD-10-CM | POA: Diagnosis present

## 2022-08-21 DIAGNOSIS — F10229 Alcohol dependence with intoxication, unspecified: Secondary | ICD-10-CM | POA: Diagnosis present

## 2022-08-21 DIAGNOSIS — K766 Portal hypertension: Secondary | ICD-10-CM | POA: Diagnosis present

## 2022-08-21 DIAGNOSIS — K7031 Alcoholic cirrhosis of liver with ascites: Secondary | ICD-10-CM | POA: Diagnosis present

## 2022-08-21 DIAGNOSIS — R160 Hepatomegaly, not elsewhere classified: Secondary | ICD-10-CM | POA: Diagnosis present

## 2022-08-21 DIAGNOSIS — K7030 Alcoholic cirrhosis of liver without ascites: Secondary | ICD-10-CM

## 2022-08-21 DIAGNOSIS — D696 Thrombocytopenia, unspecified: Secondary | ICD-10-CM | POA: Insufficient documentation

## 2022-08-21 HISTORY — DX: Hypo-osmolality and hyponatremia: E87.1

## 2022-08-21 HISTORY — DX: Adverse effect of unspecified drugs, medicaments and biological substances, initial encounter: T50.905A

## 2022-08-21 HISTORY — DX: Alcohol dependence with alcohol-induced psychotic disorder with hallucinations: F10.251

## 2022-08-21 LAB — CBC
HCT: 32.5 % — ABNORMAL LOW (ref 39.0–52.0)
Hemoglobin: 12 g/dL — ABNORMAL LOW (ref 13.0–17.0)
MCH: 40 pg — ABNORMAL HIGH (ref 26.0–34.0)
MCHC: 36.9 g/dL — ABNORMAL HIGH (ref 30.0–36.0)
MCV: 108.3 fL — ABNORMAL HIGH (ref 80.0–100.0)
Platelets: 123 10*3/uL — ABNORMAL LOW (ref 150–400)
RBC: 3 MIL/uL — ABNORMAL LOW (ref 4.22–5.81)
RDW: 15.3 % (ref 11.5–15.5)
WBC: 13.3 10*3/uL — ABNORMAL HIGH (ref 4.0–10.5)
nRBC: 0 % (ref 0.0–0.2)

## 2022-08-21 LAB — COMPREHENSIVE METABOLIC PANEL
ALT: 73 U/L — ABNORMAL HIGH (ref 0–44)
AST: 257 U/L — ABNORMAL HIGH (ref 15–41)
Albumin: 2.5 g/dL — ABNORMAL LOW (ref 3.5–5.0)
Alkaline Phosphatase: 232 U/L — ABNORMAL HIGH (ref 38–126)
Anion gap: 10 (ref 5–15)
BUN: 9 mg/dL (ref 6–20)
CO2: 30 mmol/L (ref 22–32)
Calcium: 7.9 mg/dL — ABNORMAL LOW (ref 8.9–10.3)
Chloride: 87 mmol/L — ABNORMAL LOW (ref 98–111)
Creatinine, Ser: 0.53 mg/dL — ABNORMAL LOW (ref 0.61–1.24)
GFR, Estimated: >60 mL/min (ref >60)
Glucose, Bld: 100 mg/dL — ABNORMAL HIGH (ref 70–99)
Potassium: 3.1 mmol/L — ABNORMAL LOW (ref 3.5–5.1)
Sodium: 127 mmol/L — ABNORMAL LOW (ref 135–145)
Total Bilirubin: 7.5 mg/dL — ABNORMAL HIGH (ref 0.3–1.2)
Total Protein: 6.7 g/dL (ref 6.5–8.1)

## 2022-08-21 LAB — RAPID URINE DRUG SCREEN, HOSP PERFORMED
Amphetamines: NOT DETECTED
Barbiturates: NOT DETECTED
Benzodiazepines: NOT DETECTED
Cocaine: NOT DETECTED
Opiates: NOT DETECTED
Tetrahydrocannabinol: NOT DETECTED

## 2022-08-21 LAB — FOLATE: Folate: 1.6 ng/mL — ABNORMAL LOW (ref >5.9)

## 2022-08-21 LAB — PROTIME-INR
INR: 1.3 — ABNORMAL HIGH (ref 0.8–1.2)
Prothrombin Time: 16.1 seconds — ABNORMAL HIGH (ref 11.4–15.2)

## 2022-08-21 LAB — AMMONIA: Ammonia: 28 umol/L (ref 9–35)

## 2022-08-21 LAB — VITAMIN B12: Vitamin B-12: 452 pg/mL (ref 180–914)

## 2022-08-21 LAB — CHLORIDE, URINE, RANDOM: Chloride Urine: 19 mmol/L

## 2022-08-21 LAB — LIPASE, BLOOD: Lipase: 39 U/L (ref 11–51)

## 2022-08-21 LAB — SODIUM, URINE, RANDOM: Sodium, Ur: <10 mmol/L

## 2022-08-21 LAB — ETHANOL: Alcohol, Ethyl (B): <10 mg/dL (ref <10)

## 2022-08-21 MED ORDER — POTASSIUM CHLORIDE 10 MEQ/100ML IV SOLN
10.0000 meq | INTRAVENOUS | Status: AC
  Administered 2022-08-21 – 2022-08-22 (×2): 10 meq via INTRAVENOUS
  Filled 2022-08-21 (×2): qty 100

## 2022-08-21 MED ORDER — LORAZEPAM 1 MG PO TABS: 0.0000 mg | ORAL_TABLET | Freq: Two times a day (BID) | ORAL | Status: DC

## 2022-08-21 MED ORDER — ONDANSETRON HCL 4 MG/2ML IJ SOLN
4.0000 mg | Freq: Once | INTRAMUSCULAR | Status: AC
  Administered 2022-08-21: 4 mg via INTRAVENOUS
  Filled 2022-08-21: qty 2

## 2022-08-21 MED ORDER — LORAZEPAM 2 MG/ML IJ SOLN: 0.0000 mg | INTRAMUSCULAR | Status: DC | PRN

## 2022-08-21 MED ORDER — THIAMINE HCL 100 MG/ML IJ SOLN
100.0000 mg | Freq: Every day | INTRAMUSCULAR | Status: DC
  Administered 2022-08-21 – 2022-08-22 (×2): 100 mg via INTRAVENOUS
  Filled 2022-08-21 (×2): qty 2

## 2022-08-21 MED ORDER — LORAZEPAM 2 MG/ML IJ SOLN: 0.0000 mg | Freq: Two times a day (BID) | INTRAMUSCULAR | Status: DC

## 2022-08-21 MED ORDER — CHLORDIAZEPOXIDE HCL 25 MG PO CAPS: 50.0000 mg | ORAL_CAPSULE | Freq: Four times a day (QID) | ORAL | Status: DC

## 2022-08-21 MED ORDER — ENOXAPARIN SODIUM 40 MG/0.4ML IJ SOSY
40.0000 mg | PREFILLED_SYRINGE | INTRAMUSCULAR | Status: DC
  Administered 2022-08-21 – 2022-08-24 (×4): 40 mg via SUBCUTANEOUS
  Filled 2022-08-21 (×4): qty 0.4

## 2022-08-21 MED ORDER — LORAZEPAM 1 MG PO TABS: 0.0000 mg | ORAL_TABLET | ORAL | Status: DC | PRN

## 2022-08-21 MED ORDER — SODIUM CHLORIDE 0.9 % IV BOLUS
1000.0000 mL | Freq: Once | INTRAVENOUS | Status: AC
  Administered 2022-08-21: 1000 mL via INTRAVENOUS

## 2022-08-21 MED ORDER — LORAZEPAM 2 MG/ML IJ SOLN
0.0000 mg | Freq: Four times a day (QID) | INTRAMUSCULAR | Status: DC
  Administered 2022-08-21: 1 mg via INTRAVENOUS
  Filled 2022-08-21: qty 1

## 2022-08-21 MED ORDER — CHLORDIAZEPOXIDE HCL 25 MG PO CAPS
25.0000 mg | ORAL_CAPSULE | Freq: Four times a day (QID) | ORAL | Status: DC
  Administered 2022-08-21 – 2022-08-22 (×2): 25 mg via ORAL
  Filled 2022-08-21 (×2): qty 1

## 2022-08-21 MED ORDER — LORAZEPAM 1 MG PO TABS: 0.0000 mg | ORAL_TABLET | Freq: Four times a day (QID) | ORAL | Status: DC

## 2022-08-21 MED ORDER — THIAMINE MONONITRATE 100 MG PO TABS: 100.0000 mg | ORAL_TABLET | Freq: Every day | ORAL | Status: DC

## 2022-08-21 NOTE — Assessment & Plan Note (Signed)
Due to alcohol and folate deficiency - Continue folate

## 2022-08-21 NOTE — Assessment & Plan Note (Signed)
Improving.

## 2022-08-21 NOTE — Assessment & Plan Note (Addendum)
Hyperbilirubinemia Thrombocytopenia -newly diagnosed liver cirrhosis with possible right hepatic mass concerning for hepatocellular carcinoma on CT A/P on 2/3 -Appears to be decompensating with worsen hyperbilirubinemia 7.5, coagulopathy with elevated INR of 1.3, thrombocytopenia and hyponatremia of 127 -RUQ ultrasound showed obscure common bile duct and questionable thrombosis of the main portal vein. Although recent CT A/P showed patent veins.  -obtain MRCP to assess possible mass obstruction of CBD  -[facility] Dr. Therisa Doyne consulted by EDP and will see in consultation in the morning

## 2022-08-21 NOTE — Assessment & Plan Note (Signed)
Still withdrawing somewhat, but improving with gabapentin.  Last drink 2 days PTA, now 4 days out, PAWSS 5, no delirium.   - Continue gabapentin 400 TID - Continue melatonin or trazodone for sleep PRN - Continue CIWA scoring with PRN Ativan - Continue folate and thiamine

## 2022-08-21 NOTE — ED Triage Notes (Signed)
Pt reports quit drinking 2 days ago and is going through alcohol withdrawal. Pt c/o shakes, lightheadedness, depression.

## 2022-08-21 NOTE — Assessment & Plan Note (Signed)
Hypervolemic in setting of cirrhosis

## 2022-08-21 NOTE — ED Provider Notes (Signed)
[place] [facility] Provider Note   CSN: [id number] Arrival date & time: 08/21/22  1418     History  Chief Complaint  Patient presents with   Alcohol Intoxication    John Maxwell is a 42 y.o. male.  42 year old male brought in by wife with concern for alcohol withdrawal with history of prior seizures with withdrawal. Patient quit drinking 3 day days ago (beer and smirnoff), feeling tremulous, confused, nauseous, depression. Wife has noticed he is jaundiced.  Patient was seen in the ER 07/31/22, had CT abdomen/pelvis that showed a 4.1cm mass concerning for hepatocellular.  Patient followed up with PCP who ordered medications to help manage his withdrawal however these were unavailable at the pharmacy.       Home Medications Prior to Admission medications   Medication Sig Start Date End Date Taking? Authorizing Provider  acamprosate (CAMPRAL) 333 MG tablet Take 2 tablets (666 mg total) by mouth 3 (three) times daily with meals. 08/19/22   Loralee Pacas, MD  chlorhexidine (PERIDEX) 0.12 % solution Use as directed 15 mLs in the mouth or throat 2 (two) times daily. Swish and spit.  Do not swallow. 05/22/18   Langston Masker B, PA-C  furosemide (LASIX) 20 MG tablet Take 20 mg by mouth daily. 08/04/22   [provider]  ibuprofen (ADVIL) 800 MG tablet Take 1 tablet (800 mg total) by mouth 3 (three) times daily. 10/06/21   Sherrill Raring, PA-C  lidocaine (LIDODERM) 5 % Place 1 patch onto the skin daily. Remove & Discard patch within 12 hours or as directed by MD 06/23/21   Suella Broad A, PA-C  lidocaine (XYLOCAINE) 2 % solution Use as directed 15 mLs in the mouth or throat as needed for mouth pain. Swish and spit.  Do not swallow. 05/22/18   Langston Masker B, PA-C  naltrexone (DEPADE) 50 MG tablet Take 1 tablet (50 mg total) by mouth daily. 08/19/22   Loralee Pacas, MD  ondansetron (ZOFRAN-ODT) 4 MG disintegrating tablet Take 1 tablet (4 mg  total) by mouth every 8 (eight) hours as needed for nausea or vomiting. 07/31/22   Carlisle Cater, PA-C  oxazepam (SERAX) 10 MG capsule Take 1 capsule (10 mg total) by mouth 3 times/day as needed-between meals & bedtime for sleep or anxiety. Do not mix with alcohol. 08/19/22   Loralee Pacas, MD  spironolactone (ALDACTONE) 50 MG tablet Take 50 mg by mouth daily. 08/05/22   [provider]      Allergies    Flexeril [cyclobenzaprine]    Review of Systems   Review of Systems Negative except as per HPI Physical Exam Updated Vital Signs BP (!) 142/100 (BP Location: Left Arm)   Pulse (!) 106   Temp 98.5 F (36.9 C) (Oral)   Resp 20   Ht '5\' 7"'$  (1.702 m)   Wt 84.5 kg   SpO2 92%   BMI 29.16 kg/m  Physical Exam Vitals and nursing note reviewed.  Constitutional:      General: He is not in acute distress.    Appearance: He is well-developed. He is not diaphoretic.  HENT:     Head: Normocephalic and atraumatic.     Nose: Nose normal.     Mouth/Throat:     Mouth: Mucous membranes are moist.  Eyes:     General: Scleral icterus present.  Cardiovascular:     Rate and Rhythm: Regular rhythm. Tachycardia present.     Heart sounds:  Normal heart sounds.  Pulmonary:     Effort: Pulmonary effort is normal.  Abdominal:     General: There is distension.     Tenderness: There is no abdominal tenderness.  Musculoskeletal:     Cervical back: Neck supple.     Right lower leg: No edema.     Left lower leg: No edema.  Skin:    General: Skin is warm and dry.     Coloration: Skin is jaundiced.     Findings: No bruising.  Neurological:     Mental Status: He is alert and oriented to person, place, and time.     Motor: Tremor present.  Psychiatric:        Behavior: Behavior normal.     ED Results / Procedures / Treatments   Labs (all labs ordered are listed, but only abnormal results are displayed) Labs Reviewed  COMPREHENSIVE METABOLIC PANEL - Abnormal; Notable for the following  components:      Result Value   Sodium 127 (*)    Potassium 3.1 (*)    Chloride 87 (*)    Glucose, Bld 100 (*)    Creatinine, Ser 0.53 (*)    Calcium 7.9 (*)    Albumin 2.5 (*)    AST 257 (*)    ALT 73 (*)    Alkaline Phosphatase 232 (*)    Total Bilirubin 7.5 (*)    All other components within normal limits  CBC - Abnormal; Notable for the following components:   WBC 13.3 (*)    RBC 3.00 (*)    Hemoglobin 12.0 (*)    HCT 32.5 (*)    MCV 108.3 (*)    MCH 40.0 (*)    MCHC 36.9 (*)    Platelets 123 (*)    All other components within normal limits  PROTIME-INR - Abnormal; Notable for the following components:   Prothrombin Time 16.1 (*)    INR 1.3 (*)    All other components within normal limits  FOLATE - Abnormal; Notable for the following components:   Folate 1.6 (*)    All other components within normal limits  ETHANOL  RAPID URINE DRUG SCREEN, HOSP PERFORMED  LIPASE, BLOOD  AMMONIA  SODIUM, URINE, RANDOM  CHLORIDE, URINE, RANDOM  VITAMIN B12  HIV ANTIBODY (ROUTINE TESTING W REFLEX)  OSMOLALITY  OSMOLALITY, URINE    EKG EKG Interpretation  Date/Time:  Saturday August 21 2022 15:31:38 EST Ventricular Rate:  100 PR Interval:  162 QRS Duration: 107 QT Interval:  362 QTC Calculation: 467 R Axis:   37 Text Interpretation: Sinus tachycardia Since prior ECG< sinus tachycardia present Confirmed by Gareth Morgan [phone]) on 08/21/2022 6:40:42 PM  Radiology US Abdomen Limited RUQ (LIVER/GB)  Result Date: 08/21/2022 CLINICAL DATA:  Elevated LFTs. EXAM: ULTRASOUND ABDOMEN LIMITED RIGHT UPPER QUADRANT COMPARISON:  Hepatic elastography 08/18/2022, 3 days prior. CT 07/31/2022 FINDINGS: Gallbladder: Distended. No gallstones or wall thickening visualized. No sonographic Jonavon Trieu sign noted by sonographer. Common bile duct: Obscured and not demonstrated on the current exam. There is no intrahepatic biliary ductal dilatation. Liver: Diffusely increased and coarsened hepatic  parenchymal echogenicity. Liver parenchyma is difficult to penetrate. There is subtle capsular nodularity. No definite focal lesion, particularly no lesion corresponding to that questioned on recent CT. Flow within the main portal vein was difficult to demonstrated on the current exam, was recently demonstrated to be reversed. There may be slow flow within the main portal vein, however difficult to accurately characterize. To and fro flow  may be seen on cine clip. Other: Moderate ascites. IMPRESSION: 1. Findings consistent with hepatic cirrhosis. There is no focal lesion, although assessment is technically limited. Hypodense lesion was questioned on CT earlier today. Consider further assessment with MRI. 2. Nonspecific gallbladder distension. No evidence of acute cholecystitis. 3. 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 Slow flow that is to and fro was potentially seen. Recent hepatic elastography demonstrated reversal of flow in the main portal vein 3 days ago. Recommend further assessment with contrast-enhanced CT or MRI. 4. Moderate ascites. Electronically Signed   By: Keith Rake M.D.   On: 08/21/2022 18:43    Procedures Procedures    Medications Ordered in ED Medications  thiamine (VITAMIN B1) tablet 100 mg ( Oral See Alternative 08/21/22 1555)    Or  thiamine (VITAMIN B1) injection 100 mg (100 mg Intravenous Given 08/21/22 1555)  LORazepam (ATIVAN) injection 0-4 mg (has no administration in time range)    Or  LORazepam (ATIVAN) tablet 0-4 mg (has no administration in time range)  enoxaparin (LOVENOX) injection 40 mg (40 mg Subcutaneous Given 08/21/22 2301)  chlordiazePOXIDE (LIBRIUM) capsule 25 mg (25 mg Oral Given 08/21/22 2301)  potassium chloride 10 mEq in 100 mL IVPB (10 mEq Intravenous New Bag/Given 08/21/22 2305)  ondansetron (ZOFRAN) injection 4 mg (4 mg Intravenous Given 08/21/22 1555)  sodium chloride 0.9 % bolus  1,000 mL (0 mLs Intravenous Stopped 08/21/22 1702)    ED Course/ Medical Decision Making/ A&P                             Medical Decision Making Amount and/or Complexity of Data Reviewed Labs: ordered. Radiology: ordered.  Risk OTC drugs. Prescription drug management. Decision regarding hospitalization.   This patient presents to the ED for concern of feeling tremulous, confused, nauseous with report of depression, this involves an extensive number of treatment options, and is a complaint that carries with it a high risk of complications and morbidity.  The differential diagnosis includes but not limited to depression, alcohol withdrawal, cirrhosis, hepatic encephalopathy   Co morbidities that complicate the patient evaluation  Liver failure without hepatic coma, alcohol withdrawal, alcohol dependence with hallucinations, hyponatremia, hypokalemia, liver mass   Additional history obtained:  Additional history obtained from wife at bedside who contributes to history as above External records from outside source obtained and reviewed including Recent visit to the ER dated 07/31/2022 with review of CT imaging obtained showing 4 cm liver mass.  Follow-up to PCP office dated 08/19/2022, prescriptions sent in for alcohol use disorder however patient was unable to fill the prescriptions as pharmacy was out of stock. Prior labs on file for comparison   Lab Tests:  I Ordered, and personally interpreted labs.  The pertinent results include: CBC with mild leukocytosis white count of 3.0, platelets 123.  CMP with hyponatremia with sodium of 127, potassium is 3.1 with elevated LFTs at 257 and 73 with alk phos of 232 and bili of 7.5.  Lipase negative.  Alcohol negative.  He is negative.  Urine sodium and chloride within normal notes.  Ammonia within normal notes.  INR 1.3   Imaging Studies ordered:  I ordered imaging studies including upper quadrant ultrasound I independently visualized and  interpreted imaging which showed cirrhosis I agree with the radiologist interpretation Concern for portal vein thrombosis   Cardiac Monitoring: / EKG:  The patient was  maintained on a cardiac monitor.  I personally viewed and interpreted the cardiac monitored which showed an underlying rhythm of: Sinus tachycardia, rate 100   Consultations Obtained:  I requested consultation with the Dr. Nevada Crane with [facility] hospitalist service,  and discussed lab and imaging findings as well as pertinent plan - they recommend: Admission, request consult with GI Contacted Dr. Therisa Doyne with [facility], GI to see patient while admitted Case discussed with ER attending, Dr. Billy Fischer, agrees with plan of care   Problem List / ED Course / Critical interventions / Medication management  42 year old male presents with concern as above.  On exam, appears jaundiced with scleral icterus, has slight tremor at rest, skin is dry.  He is not a great historian and relies on his wife to provide some details for his health history today.  Patient is trying to quit drinking, last drink 3 days ago, reports nausea and feeling poorly as above.  His abdomen is distended although nontender, no bruising.  Patient provided with IV fluids and Ativan, no longer tremulous, vitals somewhat improved.  Concern for prognosis given history of seizures with withdrawal as well as recent evaluation revealing liver mass concerning for hepatic carcinoma.  Plan is for admission for further workup and management, patient and wife agreeable with plan. I ordered medication including Ativan, vitamin, Zofran, IV fluids for withdrawal Reevaluation of the patient after these medicines showed that the patient improved I have reviewed the patients home medicines and have made adjustments as needed   Social Determinants of Health:  Lives with family, has PCP   Test / Admission - Considered:  Admit         Final Clinical Impression(s) / ED  Diagnoses Final diagnoses:  Alcohol withdrawal seizure with complication, with unspecified complication ([place])  Alcoholic cirrhosis, unspecified whether ascites present [facility])  Hepatic encephalopathy [facility])    Rx / DC Orders ED Discharge Orders     None         Tacy Learn, PA-C 08/21/22 2359    Gareth Morgan, MD 08/22/22 1258

## 2022-08-21 NOTE — Progress Notes (Signed)
Plan of Care Note for accepted transfer   Patient: John Maxwell MRN: [id number]   [place]: 08/21/2022  Facility requesting transfer: [facility] ED Requesting Provider: Suella Broad, PA-EDP Reason for transfer: Alcohol withdrawal Facility course: The patient is a 42 year old male with past medical history significant for alcohol abuse, history of alcohol withdrawal related seizures, new findings of liver cirrhosis and ascites with a 4.1 cm mass seen on CT abdomen and pelvis with contrast from prior visit in 07/31/22, who presented to the ED due to concern for acute alcohol withdrawal.    The patient stopped drinking alcohol about 3 days ago and since then has experienced tremulousness, confusion, nausea, and depression.  Was followed by his PCP who prescribed medications to help manage his withdrawal symptoms.  These medications where unavailable at the patient's pharmacy.  He presented to the ED due to concern for alcohol withdrawal symptoms.  In the ED, on exam sclera icterus, tachycardic, with abdominal distention.  Lab studies notable for elevated liver chemistries and a T. bili of 7.5.  EDP will consult GI.  The patient was started on CIWA protocol.  EDP requested admission for further management.  The patient was admitted at [facility] progressive care unit as inpatient status.  Plan of care: The patient is accepted for admission to Progressive unit, at [facility].  Inpatient status.  Author: Kayleen Memos, DO 08/21/2022  Check [email or web address] for on-call coverage.  Nursing staff, Please call [place] number on Amion as soon as patient's arrival, so appropriate admitting provider can evaluate the pt.

## 2022-08-21 NOTE — ED Notes (Signed)
[facility] called for transport. 

## 2022-08-21 NOTE — H&P (Signed)
History and Physical    Patient: John Maxwell [id number] DOB: 01-28-1981 DOA: 08/21/2022 DOS: the patient was seen and examined on 08/21/2022 PCP: Loralee Pacas, MD  Patient coming from: Home  Chief Complaint:  Chief Complaint  Patient presents with   Alcohol Intoxication   HPI: John Maxwell is a 42 y.o. male with medical history significant of  alcohol abuse, history of alcohol withdrawal related seizures, new findings of liver cirrhosis and ascites with a 4.1 cm mass seen on CT abdomen and pelvis with contrast from prior visit in 07/31/22, who presented to the ED due to concern for acute alcohol withdrawal.      Pt quit drinking alcohol 2 days ago and since had generalized tremors, nausea, vomiting, and changes in bowel pattern. Has some confusion and visual hallucination. Being more jaundice. Pt is not forthcoming with amount of alcohol use but just knows its "a lot." Wife at bedside not able to quantify and knows it is "a lot" as well.  Has hx of alcohol without seizure about a year ago.  He was evaluated in ED on 07/31/22 for abdominal pain and vomiting. Had CT A/P with contrast showing hepatic steatosis with findings suggestive of cirrhosis and portal hypertension. There is a poor define juxta caval mass within right hepatic lobe concerning for hepatocellular carcinoma. Small volume ascites.   Had follow up with [facility] 2/21 and had Elastography ultrasound showing severely increased echotexture throughout the liver compatible with cirrhosis and reversal of flow in the main portal vein.   Established with new PCP on 2/22 and was prescribed acomprosate which he just took a dose of today. He was not able to pick up Naltrexone or Oxazepam.   In the ED, he was afebrile, tachycardic and tremulous. BP mildly elevated at 142/100.   WBC of 13.3, Hgb of 12 with MCV of 108, Plt of 123   Na of 127, K of 3.1, creatinine of 0.53.   AST of 257, ALT of 74, Alkaline phosphatase of 232  and worsening Tbili at 7.5.   INR elevated at 1.3.   Review of Systems: As mentioned in the history of present illness. All other systems reviewed and are negative. Past Medical History:  Diagnosis Date   Alcoholic cirrhosis of liver ([place]) 08/20/2022   Demonstrated on CT abdomen 07/2022   Anxiety    Delirium tremens ([place]) 08/20/2022   Depression    Liver failure without hepatic coma ([place]) 08/20/2022   He presents jaundiced   Substance abuse [facility])    Past Surgical History:  Procedure Laterality Date   APPENDECTOMY     Social History:  reports that he has quit smoking. His smoking use included cigarettes. He has never used smokeless tobacco. He reports current alcohol use. He reports that he does not use drugs.  Allergies  Allergen Reactions   Flexeril [Cyclobenzaprine] Anaphylaxis    Family History  Problem Relation Age of Onset   Heart disease Father    Diabetes Father    Depression Father    Heart attack Father    Mental illness Father     Prior to Admission medications   Medication Sig Start Date End Date Taking? Authorizing Provider  acamprosate (CAMPRAL) 333 MG tablet Take 2 tablets (666 mg total) by mouth 3 (three) times daily with meals. 08/19/22   Loralee Pacas, MD  chlorhexidine (PERIDEX) 0.12 % solution Use as directed 15 mLs in the mouth or throat 2 (two) times daily. Swish and spit.  Do not swallow. 05/22/18   Langston Masker B, PA-C  furosemide (LASIX) 20 MG tablet Take 20 mg by mouth daily. 08/04/22   [provider]  ibuprofen (ADVIL) 800 MG tablet Take 1 tablet (800 mg total) by mouth 3 (three) times daily. 10/06/21   Sherrill Raring, PA-C  lidocaine (LIDODERM) 5 % Place 1 patch onto the skin daily. Remove & Discard patch within 12 hours or as directed by MD 06/23/21   Suella Broad A, PA-C  lidocaine (XYLOCAINE) 2 % solution Use as directed 15 mLs in the mouth or throat as needed for mouth pain. Swish and spit.  Do not swallow. 05/22/18   Langston Masker B,  PA-C  naltrexone (DEPADE) 50 MG tablet Take 1 tablet (50 mg total) by mouth daily. 08/19/22   Loralee Pacas, MD  ondansetron (ZOFRAN-ODT) 4 MG disintegrating tablet Take 1 tablet (4 mg total) by mouth every 8 (eight) hours as needed for nausea or vomiting. 07/31/22   Carlisle Cater, PA-C  oxazepam (SERAX) 10 MG capsule Take 1 capsule (10 mg total) by mouth 3 times/day as needed-between meals & bedtime for sleep or anxiety. Do not mix with alcohol. 08/19/22   Loralee Pacas, MD  spironolactone (ALDACTONE) 50 MG tablet Take 50 mg by mouth daily. 08/05/22   [provider]    Physical Exam: Vitals:   08/21/22 1730 08/21/22 1800 08/21/22 2000 08/21/22 2103  BP: (!) 134/94 119/80 (!) 121/92 (!) 142/100  Pulse: 88 87 97 (!) 106  Resp: '14 20 13 20  '$ Temp:  98.8 F (37.1 C) 98.5 F (36.9 C) 98.5 F (36.9 C)  TempSrc:  Oral  Oral  SpO2: 94% 92% 92% 92%  Weight:      Height:       Constitutional: NAD, calm, comfortable, tremulous upper extremities and eating in bed  Eyes:bilateral sclera icterus ENMT: Mucous membranes are moist.  Neck: normal, supple Respiratory: clear to auscultation bilaterally, no wheezing, no crackles. Normal respiratory effort. No accessory muscle use.  Cardiovascular: Regular rate and rhythm, no murmurs / rubs / gallops. No extremity edema.   Abdomen: soft, moderately distended and non-tender. No rebound tenderness, guarding or rigidity. Bowel sounds positive.  Musculoskeletal: no clubbing / cyanosis. No joint deformity upper and lower extremities.  Normal muscle tone.  Skin: no rashes, lesions, ulcers. No telangectasia.   Neurologic: CN 2-12 grossly intact. Strength 5/5 in all 4. No asterixis. Pt was alert and oriented to his name, initially had the year wrong but corrected him, thinks he is in [place].  Psychiatric:Normal mood. Data Reviewed:  See HPI  Assessment and Plan: * Alcohol withdrawal ([place]) -pt with hx of alcohol withdrawal seizure presents  with withdrawal and hallucination after stopping alcohol 2 days ago -continue CIWA protocol and change to q1hr checks -start Librium '25mg'$  q6hr and can uptitrate as needed while closely monitoring for sedation given liver failure  Liver failure without hepatic coma (HCC) Hyperbilirubinemia Thrombocytopenia -newly diagnosed liver cirrhosis with possible right hepatic mass concerning for hepatocellular carcinoma on CT A/P on 2/3 -Appears to be decompensating with worsen hyperbilirubinemia 7.5, coagulopathy with elevated INR of 1.3, thrombocytopenia and hyponatremia of 127 -RUQ ultrasound showed obscure common bile duct and questionable thrombosis of the main portal vein. Although recent CT A/P showed patent veins.  -obtain MRCP to assess possible mass obstruction of CBD  -[facility] Dr. Therisa Doyne consulted by EDP and will see in consultation in the morning  Anemia -Macrocytic anemia -check Vitamin B12  and folic  Hypokalemia -K of 3.1. Give 81mq x2.   Hyponatremia -Na of 127. Has already received a liter of fluid in ED. Will await serum osmolarity since it could be hypervolemic hyponatremia from increasing ascites from liver failure      Advance Care Planning:   Code Status: Full Code   Consults: [facility]  consulted by ED PA  Family Communication: Wife and mother at bedside  Severity of Illness: The appropriate patient status for this patient is INPATIENT. Inpatient status is judged to be reasonable and necessary in order to provide the required intensity of service to ensure the patient's safety. The patient's presenting symptoms, physical exam findings, and initial radiographic and laboratory data in the context of their chronic comorbidities is felt to place them at high risk for further clinical deterioration. Furthermore, it is not anticipated that the patient will be medically stable for discharge from the hospital within 2 midnights of admission.   * I certify that at the point of  admission it is my clinical judgment that the patient will require inpatient hospital care spanning beyond 2 midnights from the point of admission due to high intensity of service, high risk for further deterioration and high frequency of surveillance required.*  Author: COrene Desanctis DO 08/21/2022 10:23 PM  For on call review [email or web address]

## 2022-08-22 ENCOUNTER — Inpatient Hospital Stay (HOSPITAL_COMMUNITY): Payer: BC Managed Care – PPO

## 2022-08-22 DIAGNOSIS — K7290 Hepatic failure, unspecified without coma: Secondary | ICD-10-CM | POA: Diagnosis not present

## 2022-08-22 DIAGNOSIS — E538 Deficiency of other specified B group vitamins: Secondary | ICD-10-CM | POA: Insufficient documentation

## 2022-08-22 DIAGNOSIS — D649 Anemia, unspecified: Secondary | ICD-10-CM | POA: Diagnosis not present

## 2022-08-22 DIAGNOSIS — D696 Thrombocytopenia, unspecified: Secondary | ICD-10-CM

## 2022-08-22 DIAGNOSIS — F10939 Alcohol use, unspecified with withdrawal, unspecified: Secondary | ICD-10-CM | POA: Diagnosis not present

## 2022-08-22 HISTORY — DX: Thrombocytopenia, unspecified: D69.6

## 2022-08-22 LAB — CBC
HCT: 32.9 % — ABNORMAL LOW (ref 39.0–52.0)
Hemoglobin: 11.6 g/dL — ABNORMAL LOW (ref 13.0–17.0)
MCH: 39.3 pg — ABNORMAL HIGH (ref 26.0–34.0)
MCHC: 35.3 g/dL (ref 30.0–36.0)
MCV: 111.5 fL — ABNORMAL HIGH (ref 80.0–100.0)
Platelets: 105 10*3/uL — ABNORMAL LOW (ref 150–400)
RBC: 2.95 MIL/uL — ABNORMAL LOW (ref 4.22–5.81)
RDW: 15.9 % — ABNORMAL HIGH (ref 11.5–15.5)
WBC: 9.4 10*3/uL (ref 4.0–10.5)
nRBC: 0.2 % (ref 0.0–0.2)

## 2022-08-22 LAB — COMPREHENSIVE METABOLIC PANEL
ALT: 60 U/L — ABNORMAL HIGH (ref 0–44)
AST: 186 U/L — ABNORMAL HIGH (ref 15–41)
Albumin: 2.5 g/dL — ABNORMAL LOW (ref 3.5–5.0)
Alkaline Phosphatase: 208 U/L — ABNORMAL HIGH (ref 38–126)
Anion gap: 6 (ref 5–15)
BUN: 12 mg/dL (ref 6–20)
CO2: 31 mmol/L (ref 22–32)
Calcium: 7.7 mg/dL — ABNORMAL LOW (ref 8.9–10.3)
Chloride: 89 mmol/L — ABNORMAL LOW (ref 98–111)
Creatinine, Ser: 0.61 mg/dL (ref 0.61–1.24)
GFR, Estimated: >60 mL/min (ref >60)
Glucose, Bld: 104 mg/dL — ABNORMAL HIGH (ref 70–99)
Potassium: 3.1 mmol/L — ABNORMAL LOW (ref 3.5–5.1)
Sodium: 126 mmol/L — ABNORMAL LOW (ref 135–145)
Total Bilirubin: 6.3 mg/dL — ABNORMAL HIGH (ref 0.3–1.2)
Total Protein: 6.4 g/dL — ABNORMAL LOW (ref 6.5–8.1)

## 2022-08-22 LAB — OSMOLALITY: Osmolality: 283 mOsm/kg (ref 275–295)

## 2022-08-22 LAB — OSMOLALITY, URINE: Osmolality, Ur: 776 mOsm/kg (ref 300–900)

## 2022-08-22 LAB — HIV ANTIBODY (ROUTINE TESTING W REFLEX): HIV Screen 4th Generation wRfx: NONREACTIVE

## 2022-08-22 MED ORDER — THIAMINE MONONITRATE 100 MG PO TABS
100.0000 mg | ORAL_TABLET | Freq: Every day | ORAL | Status: DC
  Administered 2022-08-23 – 2022-08-25 (×3): 100 mg via ORAL
  Filled 2022-08-22 (×4): qty 1

## 2022-08-22 MED ORDER — LORAZEPAM 1 MG PO TABS
1.0000 mg | ORAL_TABLET | ORAL | Status: AC | PRN
  Administered 2022-08-22 – 2022-08-23 (×2): 1 mg via ORAL
  Filled 2022-08-22 (×2): qty 1

## 2022-08-22 MED ORDER — HYDRALAZINE HCL 20 MG/ML IJ SOLN: 5.0000 mg | Freq: Three times a day (TID) | INTRAMUSCULAR | Status: DC | PRN

## 2022-08-22 MED ORDER — POTASSIUM CHLORIDE CRYS ER 20 MEQ PO TBCR
40.0000 meq | EXTENDED_RELEASE_TABLET | Freq: Every day | ORAL | Status: DC
  Administered 2022-08-22 – 2022-08-25 (×4): 40 meq via ORAL
  Filled 2022-08-22 (×4): qty 2

## 2022-08-22 MED ORDER — FUROSEMIDE 20 MG PO TABS
20.0000 mg | ORAL_TABLET | Freq: Every day | ORAL | Status: DC
  Administered 2022-08-22 – 2022-08-23 (×2): 20 mg via ORAL
  Filled 2022-08-22 (×2): qty 1

## 2022-08-22 MED ORDER — SPIRONOLACTONE 25 MG PO TABS
25.0000 mg | ORAL_TABLET | Freq: Every day | ORAL | Status: DC
  Administered 2022-08-22 – 2022-08-23 (×2): 25 mg via ORAL
  Filled 2022-08-22 (×2): qty 1

## 2022-08-22 MED ORDER — THIAMINE HCL 100 MG/ML IJ SOLN: 100.0000 mg | Freq: Every day | INTRAMUSCULAR | Status: DC

## 2022-08-22 MED ORDER — MELATONIN 3 MG PO TABS: 3.0000 mg | ORAL_TABLET | Freq: Every evening | ORAL | Status: DC | PRN

## 2022-08-22 MED ORDER — ADULT MULTIVITAMIN W/MINERALS CH
1.0000 | ORAL_TABLET | Freq: Every day | ORAL | Status: DC
  Administered 2022-08-22 – 2022-08-25 (×4): 1 via ORAL
  Filled 2022-08-22 (×4): qty 1

## 2022-08-22 MED ORDER — GADOBUTROL 1 MMOL/ML IV SOLN
8.5000 mL | Freq: Once | INTRAVENOUS | Status: AC | PRN
  Administered 2022-08-22: 8.5 mL via INTRAVENOUS

## 2022-08-22 MED ORDER — TRAZODONE HCL 50 MG PO TABS: 50.0000 mg | ORAL_TABLET | Freq: Every evening | ORAL | Status: DC | PRN

## 2022-08-22 MED ORDER — FOLIC ACID 1 MG PO TABS
1.0000 mg | ORAL_TABLET | Freq: Every day | ORAL | Status: DC
  Administered 2022-08-22 – 2022-08-25 (×4): 1 mg via ORAL
  Filled 2022-08-22 (×4): qty 1

## 2022-08-22 MED ORDER — GABAPENTIN 400 MG PO CAPS
400.0000 mg | ORAL_CAPSULE | Freq: Three times a day (TID) | ORAL | Status: DC
  Administered 2022-08-22 – 2022-08-25 (×10): 400 mg via ORAL
  Filled 2022-08-22 (×10): qty 1

## 2022-08-22 NOTE — Consult Note (Signed)
[facility] Gastroenterology Consult  Referring Provider: ER Primary Care Physician:  Loralee Pacas, MD Primary Gastroenterologist: Dr. Vreeland/[facility]  Reason for Consultation: Liver mass suspicious for hepatocellular carcinoma  HPI: John Maxwell is a 42 y.o. male was admitted yesterday with symptoms of alcohol withdrawal, mainly tremors, confusion. Most of the history is obtained from the patient as well as his wife present at bedside along with his mother in the room. Patient was diagnosed with liver cirrhosis related to significant alcohol use and is known to have a liver mass. Last alcohol use was 3 days ago. He has been drinking since the age of 16/17, almost on a daily basis, about 6-7 beers or hard lemonade. Denies vomiting blood, no history of ascites or paracentesis. No prior EGD or colonoscopy.    Past Medical History:  Diagnosis Date   Alcoholic cirrhosis of liver ([place]) 08/20/2022   Demonstrated on CT abdomen 07/2022   Anxiety    Delirium tremens ([place]) 08/20/2022   Depression    Liver failure without hepatic coma ([place]) 08/20/2022   He presents jaundiced   Substance abuse ([place])     Past Surgical History:  Procedure Laterality Date   APPENDECTOMY      Prior to Admission medications   Medication Sig Start Date End Date Taking? Authorizing Provider  acamprosate (CAMPRAL) 333 MG tablet Take 2 tablets (666 mg total) by mouth 3 (three) times daily with meals. 08/19/22  Yes Loralee Pacas, MD  furosemide (LASIX) 20 MG tablet Take 20 mg by mouth daily. 08/04/22  Yes [provider]  ondansetron (ZOFRAN-ODT) 4 MG disintegrating tablet Take 1 tablet (4 mg total) by mouth every 8 (eight) hours as needed for nausea or vomiting. 07/31/22  Yes Carlisle Cater, PA-C  spironolactone (ALDACTONE) 50 MG tablet Take 50 mg by mouth daily. 08/05/22  Yes [provider]  chlorhexidine (PERIDEX) 0.12 % solution Use as directed 15 mLs in the mouth or throat 2 (two) times  daily. Swish and spit.  Do not swallow. Patient not taking: Reported on 08/22/2022 05/22/18   Langston Masker B, PA-C  ibuprofen (ADVIL) 800 MG tablet Take 1 tablet (800 mg total) by mouth 3 (three) times daily. Patient not taking: Reported on 08/22/2022 10/06/21   Sherrill Raring, PA-C  lidocaine (LIDODERM) 5 % Place 1 patch onto the skin daily. Remove & Discard patch within 12 hours or as directed by MD Patient not taking: Reported on 08/22/2022 06/23/21   Suella Broad A, PA-C  lidocaine (XYLOCAINE) 2 % solution Use as directed 15 mLs in the mouth or throat as needed for mouth pain. Swish and spit.  Do not swallow. Patient not taking: Reported on 08/22/2022 05/22/18   Langston Masker B, PA-C  naltrexone (DEPADE) 50 MG tablet Take 1 tablet (50 mg total) by mouth daily. Patient not taking: Reported on 08/22/2022 08/19/22   Loralee Pacas, MD  oxazepam (SERAX) 10 MG capsule Take 1 capsule (10 mg total) by mouth 3 times/day as needed-between meals & bedtime for sleep or anxiety. Do not mix with alcohol. Patient not taking: Reported on 08/22/2022 08/19/22   Loralee Pacas, MD    Current Facility-Administered Medications  Medication Dose Route Frequency Provider Last Rate Last Admin   enoxaparin (LOVENOX) injection 40 mg  40 mg Subcutaneous Q24H Tu, Ching T, DO   40 mg at [id number]   folic acid (FOLVITE) tablet 1 mg  1 mg Oral Daily Danford, Suann Larry, MD   1 mg at  08/22/22 1229   furosemide (LASIX) tablet 20 mg  20 mg Oral Daily Danford, Suann Larry, MD   20 mg at 08/22/22 1349   gabapentin (NEURONTIN) capsule 400 mg  400 mg Oral TID Edwin Dada, MD   400 mg at 08/22/22 1229   LORazepam (ATIVAN) tablet 1-4 mg  1-4 mg Oral Q1H PRN Edwin Dada, MD   1 mg at 08/22/22 1235   melatonin tablet 3 mg  3 mg Oral QHS PRN Danford, Suann Larry, MD       multivitamin with minerals tablet 1 tablet  1 tablet Oral Daily Danford, Suann Larry, MD   1 tablet at 08/22/22 1229   potassium  chloride SA (KLOR-CON M) CR tablet 40 mEq  40 mEq Oral Daily Edwin Dada, MD   40 mEq at 08/22/22 1349   spironolactone (ALDACTONE) tablet 25 mg  25 mg Oral Daily Edwin Dada, MD   25 mg at 08/22/22 1349   [START ON 08/23/2022] thiamine (VITAMIN B1) tablet 100 mg  100 mg Oral Daily Danford, Suann Larry, MD       Or   Derrill Memo ON 08/23/2022] thiamine (VITAMIN B1) injection 100 mg  100 mg Intravenous Daily Danford, Suann Larry, MD       traZODone (DESYREL) tablet 50 mg  50 mg Oral QHS PRN Edwin Dada, MD        Allergies as of 08/21/2022 - Review Complete 08/21/2022  Allergen Reaction Noted   Flexeril [cyclobenzaprine] Anaphylaxis 06/17/2017    Family History  Problem Relation Age of Onset   Heart disease Father    Diabetes Father    Depression Father    Heart attack Father    Mental illness Father     Social History   Socioeconomic History   Marital status: Married    Spouse name: Not on file   Number of children: Not on file   Years of education: Not on file   Highest education level: Not on file  Occupational History   Not on file  Tobacco Use   Smoking status: Former    Types: Cigarettes   Smokeless tobacco: Never  Vaping Use   Vaping Use: Never used  Substance and Sexual Activity   Alcohol use: Yes    Comment: daily, last use 08/19/2022   Drug use: No   Sexual activity: Yes  Other Topics Concern   Not on file  Social History Narrative   Not on file   Social Determinants of Health   Financial Resource Strain: Not on file  Food Insecurity: No Food Insecurity (08/21/2022)   Hunger Vital Sign    Worried About Running Out of Food in the Last Year: Never true    Ran Out of Food in the Last Year: Never true  Transportation Needs: No Transportation Needs (08/21/2022)   PRAPARE - Transportation    Lack of Transportation (Medical): No    Lack of Transportation (Non-Medical): No  Physical Activity: Not on file  Stress: Not on file   Social Connections: Not on file  Intimate Partner Violence: Not At Risk (08/21/2022)   Humiliation, Afraid, Rape, and Kick questionnaire    Fear of Current or Ex-Partner: No    Emotionally Abused: No    Physically Abused: No    Sexually Abused: No    Review of Systems: As per HPI  Physical Exam: Vital signs in last 24 hours: Temp:  [98.4 F (36.9 C)-99 F (37.2 C)] 98.6 F (37 C) (  02/25 1248) Pulse Rate:  [87-113] 98 (02/25 1248) Resp:  [13-22] 16 (02/25 1248) BP: (119-145)/(80-100) 145/100 (02/25 1248) SpO2:  [92 %-99 %] 97 % (02/25 1248) Weight:  [84.5 kg] 84.5 kg (02/24 1427) Last BM Date : 08/22/22  General:   Alert,  Well-developed, well-nourished, pleasant and cooperative in NAD Head:  Normocephalic and atraumatic. Eyes:  Icterus.   Mild pallor Ears:  Normal auditory acuity. Nose:  No deformity, discharge,  or lesions. Mouth:  No deformity or lesions.  Oropharynx pink & moist. Neck:  Supple; no masses or thyromegaly. Lungs:  Clear throughout to auscultation.   No wheezes, crackles, or rhonchi. No acute distress. Heart:  Regular rate and rhythm; no murmurs, clicks, rubs,  or gallops. Extremities:  Without clubbing or edema. Neurologic:  Alert and  oriented x4;  grossly normal neurologically.No asterixis Skin:  Intact without significant lesions or rashes. Psych:  Alert and cooperative. Normal mood and affect.   Abdomen:  Soft, nontender and distended but not tense. No masses, hepatosplenomegaly or hernias noted. Normal bowel sounds, without guarding, and without rebound.         Lab Results: Recent Labs    08/19/22 1516 08/21/22 1432 08/22/22 0408  WBC 9.3 13.3* 9.4  HGB 12.7* 12.0* 11.6*  HCT 36.9* 32.5* 32.9*  PLT 150.0 123* 105*   BMET Recent Labs    08/19/22 1516 08/21/22 1432 08/22/22 0408  NA 134* 127* 126*  K 3.4* 3.1* 3.1*  CL 87* 87* 89*  CO2 33* 30 31  GLUCOSE 95 100* 104*  BUN '6 9 12  '$ CREATININE 0.71 0.53* 0.61  CALCIUM 8.2* 7.9* 7.7*    LFT Recent Labs    08/22/22 0408  PROT 6.4*  ALBUMIN 2.5*  AST 186*  ALT 60*  ALKPHOS 208*  BILITOT 6.3*   PT/INR Recent Labs    08/21/22 1541  LABPROT 16.1*  INR 1.3*    Studies/Results: US Abdomen Limited RUQ (LIVER/GB)  Result Date: 08/21/2022 CLINICAL DATA:  Elevated LFTs. EXAM: ULTRASOUND ABDOMEN LIMITED RIGHT UPPER QUADRANT COMPARISON:  Hepatic elastography 08/18/2022, 3 days prior. CT 07/31/2022 FINDINGS: Gallbladder: Distended. No gallstones or wall thickening visualized. No sonographic Murphy sign noted by sonographer. Common bile duct: Obscured and not demonstrated on the current exam. There is no intrahepatic biliary ductal dilatation. Liver: Diffusely increased and coarsened hepatic parenchymal echogenicity. Liver parenchyma is difficult to penetrate. There is subtle capsular nodularity. No definite focal lesion, particularly no lesion corresponding to that questioned on recent CT. Flow within the main portal vein was difficult to demonstrated on the current exam, was recently demonstrated to be reversed. There may be slow flow within the main portal vein, however difficult to accurately characterize. To and fro flow may be seen on cine clip. Other: Moderate ascites. IMPRESSION: 1. Findings consistent with hepatic cirrhosis. There is no focal lesion, although assessment is technically limited. Hypodense lesion was questioned on CT earlier today. Consider further assessment with MRI. 2. Nonspecific gallbladder distension. No evidence of acute cholecystitis. 3. 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 Slow flow that is to and fro was potentially seen. Recent hepatic elastography demonstrated reversal of flow in the main portal vein 3 days ago. Recommend further assessment with contrast-enhanced CT or MRI. 4. Moderate ascites. Electronically Signed   By: Keith Rake M.D.   On: 08/21/2022 18:43     Impression: Hepatic cirrhosis, MELD sodium score 25  4.1 cm poorly defined,  juxta Cabbell mass within right hepatic lobe concerning for hepatocellular carcinoma MRI abdomen performed today, results pending  T. 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 Continue folic acid, multivitamin, thiamine. Has been started on spironolactone 25 mg daily, will monitor renal function, plan dose adjustment accordingly. May benefit from paracentesis and fluid analysis for SBP and cytology.   LOS: 1 day   Ronnette Juniper, MD  08/22/2022, 2:24 PM

## 2022-08-22 NOTE — Assessment & Plan Note (Signed)
-   Continue folate

## 2022-08-22 NOTE — Assessment & Plan Note (Signed)
Due to cirrhosis °

## 2022-08-22 NOTE — Hospital Course (Signed)
Mr. John Maxwell is a 42 y.o. M with hx alcohol dependence, prior alcohol withdrawal seizure, and recently diagnosed cirrhosis and liver mass who presented with alcohol withdrawal.    Recently evaluated in ER for symptoms of hepatitis, noted to have imaging correlates of cirrhosis.  Seen in follow up by [facility], [place] elastography confirmed cirrhosis.  Recently PCP started acamprosate.    Two days prior to admission, patient stopped drinking, developed symptoms of withdrawal and came to the ER.    2/24: Admitted 2/25: GI consulted, MRCP showed no mass 2/26: Paracentesis pending

## 2022-08-22 NOTE — Progress Notes (Signed)
  Progress Note   Patient: John Maxwell [id number] DOB: June 07, 1981 DOA: 08/21/2022     1 DOS: the patient was seen and examined on 08/22/2022 at 9:11AM      Brief hospital course: John Maxwell is a 42 y.o. M with hx alcohol dependence, prior alcohol withdrawal seizure, and recently diagnosed cirrhosis and liver mass who presented with alcohol withdrawal.    Recently evaluated in ER for symptoms of hepatitis, noted to have imaging correlates of cirrhosis.  Seen in follow up by [facility], [place] elastography confirmed cirrhosis.  Recently PCP started acamprosate.    Two days prior to admission, patient stopped drinking, developed symptoms of withdrawal and came to the ER.    2/24: Admitted 2/25: GI consulted, MRCP done     Assessment and Plan: * Liver failure without hepatic coma (HCC) Cirrhosis, likely alcohol related Hyperbilirubinemia Thrombocytopenia Ascites Presents with decompensated cirrhosis, MELD 25 - Consult GI - Start furosemide/K and spironolactone - Check ammonia - Obtain diagnostic paracentesis    Alcohol withdrawal ([name]) Patient with CIWA <10 overnight, on Librium, still feels he is actively withdrawing. Last drink 2 days PTA.   - Stop Librium - Start gabapentin 400 TID - Start melatonin or trazodone for sleep PRN - Continue CIWA scoring with PRN Ativan - Continue folate and thiamine  Folate deficiency - Continue folate  Thrombocytopenia Due to cirrhosis  Anemia Due to alcohol and folate deficiency - Continue folate  Hypokalemia - Supp K  Hyponatremia Hypervolemic in setting of cirrhosis  Alcohol dependence with hallucinations (HCC) - Hold acamprosate and naltrexone while inpatient  Liver mass - Obtain MRCP - Consult GI - Check Hep C abs          Subjective: Having some ongoing shakes, tremor, malaise, sleeplessness, no confusion, agitation.  Belly swollen.  No vomiting, no melena.     Physical Exam: BP (!) 145/100 (BP  Location: Right Arm)   Pulse 98   Temp 99 F (37.2 C) (Oral)   Resp 16   Ht 5' 7"$  (1.702 m)   Wt 84.5 kg   SpO2 97%   BMI 29.16 kg/m   Adult male, sitting on the edge of the bed, appears tired, mild psychomotor slowing noted Tachycardic, regular, no murmurs, mild diffuse peripheral edema Respiratory rate seems normal, lungs clear without rales or wheezes, overall diminished Abdomen distended with ascites, not tense, no focal tenderness to palpation, no guarding Attention seems somewhat dazed and distracted, psychomotor slowing is mild, no asterixis, face symmetric, speech fluent, no confusion or disorientation    Data Reviewed: Hyponatremia is unchanged, potassium is low, creatinine stable Folic acid low TSH normal HIV and urine drug screen negative    Family Communication: Called wife, no answer    Disposition: Status is: Inpatient         Author: Edwin Dada, MD 08/22/2022 1:00 PM  For on call review [email or web address].

## 2022-08-22 NOTE — Assessment & Plan Note (Addendum)
Possible mass seen on CT earlier this month.  MRI obtained this admission, discussed with Dr. Romelle Starcher, no mass is apparent on MRI.

## 2022-08-22 NOTE — TOC Initial Note (Signed)
Transition of Care [facility]) - Initial/Assessment Note    Patient Details  Name: John Maxwell MRN: [id number] Date of Birth: 1980/09/08  [facility]) CM/SW Contact:    John Dine, RN Phone Number: 08/22/2022, 4:45 PM  Clinical Narrative:                 [facility] consult for SA counseling/education; spoke w/ pt in room; pt says he is from home and plans to return at d/c; he says he has transportation; pt denies food insecurity, IPV, or difficulty paying utilities; he identified POC wife John Maxwell [phone]); he says he does not have glasses, HA, or dentures; pt says he does not have DME, [place] services, home oxygen; he agrees to resources for residential and outpatient SA counseling; pt given copies of resources; pt also informed resources placed in d/c instructions; he verbalized understanding and will make his own appt at agency of choice; no TOC needs.  Expected Discharge Plan: Home/Self Care Barriers to Discharge: Continued Medical Work up   Patient Goals and CMS Choice Patient states their goals for this hospitalization and ongoing recovery are:: home          Expected Discharge Plan and Services   Discharge Planning Services: CM Consult Post Acute Care Choice: NA Living arrangements for the past 2 months: Single Family Home                                      Prior Living Arrangements/Services Living arrangements for the past 2 months: Single Family Home Lives with:: Spouse Patient language and need for interpreter reviewed:: Yes Do you feel safe going back to the place where you live?: Yes      Need for Family Participation in Patient Care: Yes (Comment) Care giver support system in place?: Yes (comment) Current home services:  (n/a) Criminal Activity/Legal Involvement Pertinent to Current Situation/Hospitalization: No - Comment as needed  Activities of Daily Living Home Assistive Devices/Equipment: None ADL Screening (condition at  time of admission) Patient's cognitive ability adequate to safely complete daily activities?: Yes Is the patient deaf or have difficulty hearing?: No Does the patient have difficulty seeing, even when wearing glasses/contacts?: No Does the patient have difficulty concentrating, remembering, or making decisions?: No Patient able to express need for assistance with ADLs?: Yes Does the patient have difficulty dressing or bathing?: No Independently performs ADLs?: Yes (appropriate for developmental age) Does the patient have difficulty walking or climbing stairs?: No Weakness of Legs: Both Weakness of Arms/Hands: None  Permission Sought/Granted Permission sought to share information with : Case Manager Permission granted to share information with : Yes, Verbal Permission Granted  Share Information with NAME: John Coffin, RN, CM     Permission granted to share info w Relationship: John Maxwell (spouse) [phone]     Emotional Assessment Appearance:: Appears stated age Attitude/Demeanor/Rapport: Gracious Affect (typically observed): Accepting Orientation: : Oriented to Self, Oriented to Place, Oriented to  Time, Oriented to Situation Alcohol / Substance Use: Alcohol Use Psych Involvement: No (comment)  Admission diagnosis:  Alcohol withdrawal ([place]) [F10.939] Hepatic encephalopathy ([place]) [K76.82] Alcohol withdrawal seizure with complication, with unspecified complication ([place]) [id number], 123XX123 Alcoholic cirrhosis, unspecified whether ascites present [facility]) [K70.30] Patient Active Problem List   Diagnosis Date Noted   Thrombocytopenia 08/22/2022   Folate deficiency 08/22/2022   Alcohol withdrawal ([place]) 08/21/2022   Alcohol dependence with hallucinations ([place]) 08/21/2022  Hyponatremia 08/21/2022   Hypokalemia 08/21/2022   Anemia 08/21/2022   Liver mass 08/20/2022   Alcohol use disorder 08/20/2022   Overweight 08/20/2022   Panic attack 123456   Alcoholic cirrhosis of  liver ([place]) 08/20/2022   Liver failure without hepatic coma ([place]) 08/20/2022   PCP:  Loralee Pacas, MD Pharmacy:   [facility] [phone] - [place], [place] - [address] AT [place] [address] [place] [address] Phone: [phone] Fax: [phone]     Social Determinants of Health (SDOH) Social History: SDOH Screenings   Food Insecurity: No Food Insecurity (08/22/2022)  Housing: Low Risk  (08/22/2022)  Transportation Needs: No Transportation Needs (08/22/2022)  Utilities: Not At Risk (08/22/2022)  Depression (PHQ2-9): High Risk (08/19/2022)  Tobacco Use: Medium Risk (08/21/2022)   SDOH Interventions: Food Insecurity Interventions: Inpatient TOC Housing Interventions: Inpatient TOC Transportation Interventions: Inpatient TOC Utilities Interventions: Inpatient TOC   Readmission Risk Interventions     No data to display

## 2022-08-22 NOTE — Assessment & Plan Note (Signed)
-   Hold acamprosate and naltrexone while inpatient

## 2022-08-23 ENCOUNTER — Ambulatory Visit (HOSPITAL_COMMUNITY): Payer: BC Managed Care – PPO

## 2022-08-23 ENCOUNTER — Inpatient Hospital Stay (HOSPITAL_COMMUNITY): Payer: BC Managed Care – PPO

## 2022-08-23 DIAGNOSIS — K7682 Hepatic encephalopathy: Secondary | ICD-10-CM

## 2022-08-23 DIAGNOSIS — F10939 Alcohol use, unspecified with withdrawal, unspecified: Secondary | ICD-10-CM | POA: Diagnosis not present

## 2022-08-23 DIAGNOSIS — K7290 Hepatic failure, unspecified without coma: Secondary | ICD-10-CM | POA: Diagnosis not present

## 2022-08-23 HISTORY — DX: Hepatic encephalopathy: K76.82

## 2022-08-23 LAB — CBC
HCT: 31.9 % — ABNORMAL LOW (ref 39.0–52.0)
Hemoglobin: 11.1 g/dL — ABNORMAL LOW (ref 13.0–17.0)
MCH: 39.9 pg — ABNORMAL HIGH (ref 26.0–34.0)
MCHC: 34.8 g/dL (ref 30.0–36.0)
MCV: 114.7 fL — ABNORMAL HIGH (ref 80.0–100.0)
Platelets: 113 10*3/uL — ABNORMAL LOW (ref 150–400)
RBC: 2.78 MIL/uL — ABNORMAL LOW (ref 4.22–5.81)
RDW: 16 % — ABNORMAL HIGH (ref 11.5–15.5)
WBC: 8.4 10*3/uL (ref 4.0–10.5)
nRBC: 0 % (ref 0.0–0.2)

## 2022-08-23 LAB — HEPATITIS PANEL, ACUTE
HCV Ab: NONREACTIVE
Hep A IgM: NONREACTIVE
Hep B C IgM: NONREACTIVE
Hepatitis B Surface Ag: NONREACTIVE

## 2022-08-23 LAB — BODY FLUID CELL COUNT WITH DIFFERENTIAL
Eos, Fluid: 0 %
Lymphs, Fluid: 7 %
Monocyte-Macrophage-Serous Fluid: 86 % (ref 50–90)
Neutrophil Count, Fluid: 7 % (ref 0–25)
Total Nucleated Cell Count, Fluid: 173 cu mm (ref 0–1000)

## 2022-08-23 LAB — COMPREHENSIVE METABOLIC PANEL
ALT: 54 U/L — ABNORMAL HIGH (ref 0–44)
AST: 155 U/L — ABNORMAL HIGH (ref 15–41)
Albumin: 2.4 g/dL — ABNORMAL LOW (ref 3.5–5.0)
Alkaline Phosphatase: 189 U/L — ABNORMAL HIGH (ref 38–126)
Anion gap: 7 (ref 5–15)
BUN: 10 mg/dL (ref 6–20)
CO2: 32 mmol/L (ref 22–32)
Calcium: 8 mg/dL — ABNORMAL LOW (ref 8.9–10.3)
Chloride: 91 mmol/L — ABNORMAL LOW (ref 98–111)
Creatinine, Ser: 0.59 mg/dL — ABNORMAL LOW (ref 0.61–1.24)
GFR, Estimated: >60 mL/min (ref >60)
Glucose, Bld: 111 mg/dL — ABNORMAL HIGH (ref 70–99)
Potassium: 3.4 mmol/L — ABNORMAL LOW (ref 3.5–5.1)
Sodium: 130 mmol/L — ABNORMAL LOW (ref 135–145)
Total Bilirubin: 4.3 mg/dL — ABNORMAL HIGH (ref 0.3–1.2)
Total Protein: 6.1 g/dL — ABNORMAL LOW (ref 6.5–8.1)

## 2022-08-23 LAB — ALBUMIN, PLEURAL OR PERITONEAL FLUID: Albumin, Fluid: <1.5 g/dL

## 2022-08-23 LAB — AMMONIA: Ammonia: 53 umol/L — ABNORMAL HIGH (ref 9–35)

## 2022-08-23 LAB — AFP TUMOR MARKER: AFP-Tumor Marker: 4.2 ng/mL (ref <6.1)

## 2022-08-23 LAB — PROTIME-INR
INR: 1.3 — ABNORMAL HIGH (ref 0.8–1.2)
Prothrombin Time: 16 seconds — ABNORMAL HIGH (ref 11.4–15.2)

## 2022-08-23 MED ORDER — LIDOCAINE HCL 1 % IJ SOLN
INTRAMUSCULAR | Status: AC
  Administered 2022-08-23: 15 mL
  Filled 2022-08-23: qty 20

## 2022-08-23 MED ORDER — TRAZODONE HCL 50 MG PO TABS
50.0000 mg | ORAL_TABLET | Freq: Every day | ORAL | Status: DC
  Administered 2022-08-23 – 2022-08-24 (×2): 50 mg via ORAL
  Filled 2022-08-23 (×2): qty 1

## 2022-08-23 MED ORDER — FUROSEMIDE 40 MG PO TABS
40.0000 mg | ORAL_TABLET | Freq: Every day | ORAL | Status: DC
  Administered 2022-08-24 – 2022-08-25 (×2): 40 mg via ORAL
  Filled 2022-08-23 (×2): qty 1

## 2022-08-23 MED ORDER — SPIRONOLACTONE 25 MG PO TABS
100.0000 mg | ORAL_TABLET | Freq: Every day | ORAL | Status: DC
  Administered 2022-08-24 – 2022-08-25 (×2): 100 mg via ORAL
  Filled 2022-08-23 (×2): qty 4

## 2022-08-23 MED ORDER — LACTULOSE 10 GM/15ML PO SOLN
20.0000 g | Freq: Two times a day (BID) | ORAL | Status: DC
  Administered 2022-08-23 (×2): 20 g via ORAL
  Filled 2022-08-23 (×2): qty 30

## 2022-08-23 NOTE — Progress Notes (Signed)
Pt awoke from a long nap confused. Paced between bathroom and his room and told me he was in his sisters room. Verbally deescalated the situation and had him sit down with me. Reoriented him to his surroundings. Facetimed his wife, tearful stating he was scared. Notified MD. Hanley Seamen 1 mg Ativan per CIWA orders. MD visited with pt. Pt calm and cooperative, ordering supper now. No aggressive or combative behaviors noted.

## 2022-08-23 NOTE — Progress Notes (Addendum)
[facility] Progress Note  John Maxwell 42 y.o. 09/13/1980  CC:  Cirrhosis   Subjective: Patient states he is doing well today. Has some mild abdominal pain.  ROS : Review of Systems  Constitutional:  Negative for chills and fever.  Gastrointestinal:  Positive for abdominal pain. Negative for blood in stool, constipation, diarrhea, heartburn, melena, nausea and vomiting.      Objective: Vital signs in last 24 hours: Vitals:   08/22/22 2111 08/23/22 0600  BP: (!) 134/100 (!) 133/100  Pulse: 98 (!) 101  Resp: 20 20  Temp: 99.1 F (37.3 C) 99.4 F (37.4 C)  SpO2: 99% 100%    Physical Exam:  General:  Alert, cooperative, no distress, appears stated age  Head:  Normocephalic, without obvious abnormality, atraumatic  Eyes:  icteric sclera, EOM's intact  Lungs:   Clear to auscultation bilaterally, respirations unlabored  Heart:  Regular rate and rhythm, S1, S2 normal  Abdomen:    Distention, generalized tenderness. No asterixis. Normal BS    Lab Results: Recent Labs    08/22/22 0408 08/23/22 0326  NA 126* 130*  K 3.1* 3.4*  CL 89* 91*  CO2 31 32  GLUCOSE 104* 111*  BUN 12 10  CREATININE 0.61 0.59*  CALCIUM 7.7* 8.0*   Recent Labs    08/22/22 0408 08/23/22 0326  AST 186* 155*  ALT 60* 54*  ALKPHOS 208* 189*  BILITOT 6.3* 4.3*  PROT 6.4* 6.1*  ALBUMIN 2.5* 2.4*   Recent Labs    08/22/22 0408 08/23/22 0326  WBC 9.4 8.4  HGB 11.6* 11.1*  HCT 32.9* 31.9*  MCV 111.5* 114.7*  PLT 105* 113*   Recent Labs    08/21/22 1541 08/23/22 0326  LABPROT 16.1* 16.0*  INR 1.3* 1.3*      Assessment Cirrhosis - AST 155 ALT 54  Alkphos 189 TBili 4.3 INR 08/23/2022 1.3  MELD 3.0: 20 at 08/23/2022  3:26 AM MELD-Na: 21 at 08/23/2022  3:26 AM Calculated from: Serum Creatinine: 0.59 mg/dL (Using min of 1 mg/dL) at 08/23/2022  3:26 AM Serum Sodium: 130 mmol/L at 08/23/2022  3:26 AM Total Bilirubin: 4.3 mg/dL at 08/23/2022  3:26 AM Serum Albumin: 2.4  g/dL at 08/23/2022  3:26 AM INR(ratio): 1.3 at 08/23/2022  3:26 AM Age at listing (hypothetical): 53 years Sex: Male at 08/23/2022  3:26 AM - MRCP: cirrhosis with portal hypertension, no evidence of hepatic neoplasm. Moderate ascites and mesenteric edema.     Plan: Continued care for alcohol withdrawal protocol Continue folic acid, multivitamin, and thiamine Continue spironolactone '25mg'$  daily With distended abdomen and generalized abdominal pain, scheduled today for therapeutic/diagnostic paracentesis with fluid analysis to r/o SBP [facility] will follow  Garnette Scheuermann PA-C 08/23/2022, 10:56 AM  Contact #  [phone]

## 2022-08-23 NOTE — Progress Notes (Signed)
  Progress Note   Patient: John Maxwell [id number] DOB: 08-07-1980 DOA: 08/21/2022     2 DOS: the patient was seen and examined on 08/23/2022 at 12:18PM      Brief hospital course: Mr. Counihan is a 42 y.o. M with hx alcohol dependence, prior alcohol withdrawal seizure, and recently diagnosed cirrhosis and liver mass who presented with alcohol withdrawal.    Recently evaluated in ER for symptoms of hepatitis, noted to have imaging correlates of cirrhosis.  Seen in follow up by [facility], [place] elastography confirmed cirrhosis.  Recently PCP started acamprosate.    Two days prior to admission, patient stopped drinking, developed symptoms of withdrawal and came to the ER.    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  Last drink 2 days PTA, now 4 days out, PAWSS 5, no delirium.   - Continue gabapentin 400 TID - Continue melatonin or trazodone for sleep PRN - Continue CIWA scoring with PRN Ativan - Continue folate and thiamine  Acute hepatic encephalopathy [facility]) Patient with psychomotor slowing.  Ammonia 53. - Start lactulose  Folate deficiency - Continue folate  Thrombocytopenia Due to cirrhosis  Anemia Due to alcohol and folate deficiency - Continue folate  Hypokalemia Improving  Hyponatremia Hypervolemic in setting of cirrhosis  Alcohol dependence with hallucinations (HCC) - Hold acamprosate and naltrexone while inpatient  Liver mass, ruled out Possible mass seen on CT earlier this month.  MRI obtained this admission, discussed  with Dr. Romelle Starcher, no mass is apparent on MRI.          Subjective: No abdominal pain, fever, vomiting.  Denies confusion.  Oriented to person, place, and time.  Has some psychomotor slowing.  Had a paracentesis earlier today.     Physical Exam: BP (!) 126/93   Pulse 99   Temp 98.3 F (36.8 C) (Oral)   Resp 18   Ht 5' 7"$  (1.702 m)   Wt 84.5 kg   SpO2 100%   BMI 29.16 kg/m   Slightly jaundiced adult male, ambulating in the room, interactive, mild psychomotor slowing is noted Tachycardic, regular, no murmurs, mild peripheral edema Respiratory rate normal, lungs clear without rales or wheezes Abdomen soft without tenderness palpation, no ascites Attention normal, affect, psychomotor slowing noted, oriented to person, place, time, and situation, face symmetric, speech fluent, moves all extremities with normal strength and coordination    Data Reviewed: Ammonia 53 Sodium up to 130 Potassium up to 3.4, creatinine stable Platelets 113, no change, hemoglobin 11, no change MRCP shows no masses, discussed with radiology  Family Communication: None present    Disposition: Status is: Inpatient         Author: Edwin Dada, MD 08/23/2022 2:50 PM  For on call review [email or web address].

## 2022-08-23 NOTE — Procedures (Signed)
PROCEDURE SUMMARY:  Successful ultrasound guided paracentesis from the right lower quadrant.  Yielded 2 liters of yellow fluid (MAXIMUM ORDERED) No immediate complications.  The patient tolerated the procedure well.   Specimen was sent for labs.  EBL none  If the patient eventually requires >/=2 paracenteses in a 30 day period, screening evaluation by the [facility] will be assessed.   Rowe Robert, PA-C

## 2022-08-23 NOTE — Assessment & Plan Note (Signed)
Patient with psychomotor slowing.  Ammonia 53. - Start lactulose

## 2022-08-24 ENCOUNTER — Encounter: Payer: Self-pay | Admitting: Internal Medicine

## 2022-08-24 DIAGNOSIS — F10939 Alcohol use, unspecified with withdrawal, unspecified: Secondary | ICD-10-CM | POA: Diagnosis not present

## 2022-08-24 DIAGNOSIS — D649 Anemia, unspecified: Secondary | ICD-10-CM | POA: Diagnosis not present

## 2022-08-24 DIAGNOSIS — E538 Deficiency of other specified B group vitamins: Secondary | ICD-10-CM | POA: Diagnosis not present

## 2022-08-24 DIAGNOSIS — K7290 Hepatic failure, unspecified without coma: Secondary | ICD-10-CM | POA: Diagnosis not present

## 2022-08-24 LAB — CBC
HCT: 36.3 % — ABNORMAL LOW (ref 39.0–52.0)
Hemoglobin: 12.7 g/dL — ABNORMAL LOW (ref 13.0–17.0)
MCH: 41.1 pg — ABNORMAL HIGH (ref 26.0–34.0)
MCHC: 35 g/dL (ref 30.0–36.0)
MCV: 117.5 fL — ABNORMAL HIGH (ref 80.0–100.0)
Platelets: 121 10*3/uL — ABNORMAL LOW (ref 150–400)
RBC: 3.09 MIL/uL — ABNORMAL LOW (ref 4.22–5.81)
RDW: 16.9 % — ABNORMAL HIGH (ref 11.5–15.5)
WBC: 8.7 10*3/uL (ref 4.0–10.5)
nRBC: 0.2 % (ref 0.0–0.2)

## 2022-08-24 LAB — COMPREHENSIVE METABOLIC PANEL
ALT: 54 U/L — ABNORMAL HIGH (ref 0–44)
AST: 139 U/L — ABNORMAL HIGH (ref 15–41)
Albumin: 2.6 g/dL — ABNORMAL LOW (ref 3.5–5.0)
Alkaline Phosphatase: 187 U/L — ABNORMAL HIGH (ref 38–126)
Anion gap: 6 (ref 5–15)
BUN: 9 mg/dL (ref 6–20)
CO2: 31 mmol/L (ref 22–32)
Calcium: 8 mg/dL — ABNORMAL LOW (ref 8.9–10.3)
Chloride: 94 mmol/L — ABNORMAL LOW (ref 98–111)
Creatinine, Ser: 0.67 mg/dL (ref 0.61–1.24)
GFR, Estimated: >60 mL/min (ref >60)
Glucose, Bld: 111 mg/dL — ABNORMAL HIGH (ref 70–99)
Potassium: 3.6 mmol/L (ref 3.5–5.1)
Sodium: 131 mmol/L — ABNORMAL LOW (ref 135–145)
Total Bilirubin: 4.5 mg/dL — ABNORMAL HIGH (ref 0.3–1.2)
Total Protein: 6.5 g/dL (ref 6.5–8.1)

## 2022-08-24 LAB — PROTIME-INR
INR: 1.3 — ABNORMAL HIGH (ref 0.8–1.2)
Prothrombin Time: 15.6 seconds — ABNORMAL HIGH (ref 11.4–15.2)

## 2022-08-24 MED ORDER — LACTULOSE 10 GM/15ML PO SOLN
30.0000 g | Freq: Three times a day (TID) | ORAL | Status: DC
  Administered 2022-08-24 – 2022-08-25 (×4): 30 g via ORAL
  Filled 2022-08-24 (×4): qty 60

## 2022-08-24 NOTE — Progress Notes (Signed)
Subjective: Patient is relieved that he does not have a liver mass on MRI. As per documentation he was confused yesterday evening and needed Ativan.  Objective: Vital signs in last 24 hours: Temp:  [99 F (37.2 C)-99.2 F (37.3 C)] 99 F (37.2 C) (02/27 0427) Pulse Rate:  [86-96] 96 (02/27 0427) Resp:  [14-18] 14 (02/27 0427) BP: (117-120)/(82) 120/82 (02/27 0427) SpO2:  [98 %-99 %] 99 % (02/27 0427) Weight change:  Last BM Date : 08/20/22 (per pt report)  PE: Awake, oriented x 3, no asterixis GENERAL: Not in distress, icteric ABDOMEN: Distended but not tense, normoactive bowel sounds EXTREMITIES: Bipedal pitting edema  Lab Results: Results for orders placed or performed during the hospital encounter of 08/21/22 (from the past 48 hour(s))  Hepatitis panel, acute     Status: None   Collection Time: 08/23/22  3:26 AM  Result Value Ref Range   Hepatitis B Surface Ag NON REACTIVE NON REACTIVE   HCV Ab NON REACTIVE NON REACTIVE    Comment: (NOTE) Nonreactive HCV antibody screen is consistent with no HCV infections,  unless recent infection is suspected or other evidence exists to indicate HCV infection.     Hep A IgM NON REACTIVE NON REACTIVE   Hep B C IgM NON REACTIVE NON REACTIVE    Comment: Performed at [facility], [place] [address] [place], [place] [address]  Ammonia     Status: Abnormal   Collection Time: 08/23/22  3:26 AM  Result Value Ref Range   Ammonia 53 (H) 9 - 35 umol/L    Comment: Performed at [facility], [place] [address], [place], [place] [address]  Comprehensive metabolic panel     Status: Abnormal   Collection Time: 08/23/22  3:26 AM  Result Value Ref Range   Sodium 130 (L) 135 - 145 mmol/L   Potassium 3.4 (L) 3.5 - 5.1 mmol/L   Chloride 91 (L) 98 - 111 mmol/L   CO2 32 22 - 32 mmol/L   Glucose, Bld 111 (H) 70 - 99 mg/dL    Comment: Glucose reference range applies only to samples taken after fasting for at least 8 hours.   BUN 10  6 - 20 mg/dL   Creatinine, Ser 0.59 (L) 0.61 - 1.24 mg/dL   Calcium 8.0 (L) 8.9 - 10.3 mg/dL   Total Protein 6.1 (L) 6.5 - 8.1 g/dL   Albumin 2.4 (L) 3.5 - 5.0 g/dL   AST 155 (H) 15 - 41 U/L   ALT 54 (H) 0 - 44 U/L   Alkaline Phosphatase 189 (H) 38 - 126 U/L   Total Bilirubin 4.3 (H) 0.3 - 1.2 mg/dL   GFR, Estimated >60 >60 mL/min    Comment: (NOTE) Calculated using the CKD-EPI Creatinine Equation (2021)    Anion gap 7 5 - 15    Comment: Performed at [facility], [place] [address] [place], [place] [address]  Protime-INR     Status: Abnormal   Collection Time: 08/23/22  3:26 AM  Result Value Ref Range   Prothrombin Time 16.0 (H) 11.4 - 15.2 seconds   INR 1.3 (H) 0.8 - 1.2    Comment: (NOTE) INR goal varies based on device and disease states. Performed at [facility], [place] [address] [place],  [address]   CBC     Status: Abnormal   Collection Time: 08/23/22  3:26 AM  Result Value Ref Range   WBC 8.4 4.0 - 10.5 K/uL  RBC 2.78 (L) 4.22 - 5.81 MIL/uL   Hemoglobin 11.1 (L) 13.0 - 17.0 g/dL   HCT 31.9 (L) 39.0 - 52.0 %   MCV 114.7 (H) 80.0 - 100.0 fL   MCH 39.9 (H) 26.0 - 34.0 pg   MCHC 34.8 30.0 - 36.0 g/dL   RDW 16.0 (H) 11.5 - 15.5 %   Platelets 113 (L) 150 - 400 K/uL    Comment: SPECIMEN CHECKED FOR CLOTS Immature Platelet Fraction may be clinically indicated, consider ordering this additional test [id number] REPEATED TO VERIFY    nRBC 0.0 0.0 - 0.2 %    Comment: Performed at [facility], [place] [address] [place], [place] [address]  Body fluid cell count with differential     Status: Abnormal   Collection Time: 08/23/22 11:52 AM  Result Value Ref Range   Fluid Type-FCT CYTO PERI    Color, Fluid YELLOW (A) YELLOW   Appearance, Fluid CLEAR CLEAR   Total Nucleated Cell Count, Fluid 173 0 - 1,000 cu mm   Neutrophil Count, Fluid 7 0 - 25 %   Lymphs, Fluid 7 %   Monocyte-Macrophage-Serous Fluid 86 50 - 90 %    Eos, Fluid 0 %    Comment: Performed at [facility], [place] [address], [place], [place] [address]  Albumin, pleural or peritoneal fluid      Status: None   Collection Time: 08/23/22 11:52 AM  Result Value Ref Range   Albumin, Fluid <1.5 g/dL    Comment: (NOTE) No normal range established for this test Results should be evaluated in conjunction with serum values    Fluid Type-FALB CYTO PERI     Comment: Performed at [facility], [place] [address] [place], [place] [address]  CBC     Status: Abnormal   Collection Time: 08/24/22  4:17 AM  Result Value Ref Range   WBC 8.7 4.0 - 10.5 K/uL   RBC 3.09 (L) 4.22 - 5.81 MIL/uL   Hemoglobin 12.7 (L) 13.0 - 17.0 g/dL   HCT 36.3 (L) 39.0 - 52.0 %   MCV 117.5 (H) 80.0 - 100.0 fL   MCH 41.1 (H) 26.0 - 34.0 pg   MCHC 35.0 30.0 - 36.0 g/dL   RDW 16.9 (H) 11.5 - 15.5 %   Platelets 121 (L) 150 - 400 K/uL   nRBC 0.2 0.0 - 0.2 %    Comment: Performed at [facility], [place] [address] [place], [place] [address]  Comprehensive metabolic panel     Status: Abnormal   Collection Time: 08/24/22  4:17 AM  Result Value Ref Range   Sodium 131 (L) 135 - 145 mmol/L   Potassium 3.6 3.5 - 5.1 mmol/L   Chloride 94 (L) 98 - 111 mmol/L   CO2 31 22 - 32 mmol/L   Glucose, Bld 111 (H) 70 - 99 mg/dL    Comment: Glucose reference range applies only to samples taken after fasting for at least 8 hours.   BUN 9 6 - 20 mg/dL   Creatinine, Ser 0.67 0.61 - 1.24 mg/dL   Calcium 8.0 (L) 8.9 - 10.3 mg/dL   Total Protein 6.5 6.5 - 8.1 g/dL   Albumin 2.6 (L) 3.5 - 5.0 g/dL   AST 139 (H) 15 - 41 U/L   ALT 54 (H) 0 - 44 U/L   Alkaline Phosphatase 187 (H) 38 - 126 U/L   Total Bilirubin 4.5 (H) 0.3 - 1.2 mg/dL   GFR, Estimated >  60 >60 mL/min    Comment: (NOTE) Calculated using the CKD-EPI Creatinine Equation (2021)    Anion gap 6 5 - 15    Comment: Performed at [facility], [place] [address]  [place], [place] [address]  Protime-INR     Status: Abnormal   Collection Time: 08/24/22  4:17 AM  Result Value Ref Range   Prothrombin Time 15.6 (H) 11.4 - 15.2 seconds   INR 1.3 (H) 0.8 - 1.2    Comment: (NOTE) INR goal varies based on device and disease states. Performed at [facility], [place] [address], [place], [place] [address]     Studies/Results: US Paracentesis  Result Date: 08/23/2022 INDICATION: Patient with history of alcoholic cirrhosis, substance abuse, right hepatic lobe mass, ascites. Request received for diagnostic and therapeutic paracentesis up to 2 liters. EXAM: ULTRASOUND GUIDED DIAGNOSTIC AND THERAPEUTIC PARACENTESIS MEDICATIONS: 10 mL 1% lidocaine COMPLICATIONS: None immediate. PROCEDURE: Informed written consent was obtained from the patient after a discussion of the risks, benefits and alternatives to treatment. A timeout was performed prior to the initiation of the procedure. Initial ultrasound scanning demonstrates a moderate amount of ascites within the right lower abdominal quadrant. The right lower abdomen was prepped and draped in the usual sterile fashion. 1% lidocaine was used for local anesthesia. Following this, a 19 gauge, 7-cm, Yueh catheter was introduced. An ultrasound image was saved for documentation purposes. The paracentesis was performed. The catheter was removed and a dressing was applied. The patient tolerated the procedure well without immediate post procedural complication. FINDINGS: A total of approximately 2 liters of yellow fluid was removed. Samples were sent to the laboratory as requested by the clinical team. IMPRESSION: Successful ultrasound-guided diagnostic and therapeutic paracentesis yielding 2 liters of peritoneal fluid. This is the patient's first paracentesis. Read by: Rowe Robert, PA-C PLAN: If the patient eventually requires >/=2 paracenteses in a 30 day period, candidacy for formal evaluation by the [place] [facility] will be assessed. Michaelle Birks, MD [facility] Electronically Signed   By: Michaelle Birks M.D.   On: 08/23/2022 13:02    Medications: I have reviewed the patient's current medications.  Assessment: Decompensated alcohol-related cirrhosis, MELD sodium 20 Ascites, status post 2 L paracentesis, no SBP  Normal alpha-fetoprotein, no hepatic neoplasm noted on MRI with contrast   Plan: Diuretics have been increased to furosemide 40 mg daily and spironolactone 100 mg a day. He is tolerating regular diet while being continued on folic acid, multivitamin and thiamine along with lactulose 30 g 3 times a day. Currently in the hospital for alcohol withdrawal. Recommend continuing diuretics and lactulose. Recommend abstinence from alcohol. Recommend following up with Dr. Randel Pigg after discharge. Please recall GI if needed, GI will sign off at this point.  Ronnette Juniper, MD 08/24/2022, 1:15 PM

## 2022-08-24 NOTE — Plan of Care (Signed)
  Problem: Education: Goal: Knowledge of General Education information will improve Description Including pain rating scale, medication(s)/side effects and non-pharmacologic comfort measures Outcome: Progressing   Problem: Health Behavior/Discharge Planning: Goal: Ability to manage health-related needs will improve Outcome: Progressing   

## 2022-08-24 NOTE — Progress Notes (Signed)
  Progress Note   Patient: John Maxwell [id number] DOB: April 21, 1981 DOA: 08/21/2022     3 DOS: the patient was seen and examined on 08/24/2022 at 1:05PM      Brief hospital course: Mr. Cobaugh is a 42 y.o. M with hx alcohol dependence, prior alcohol withdrawal seizure, and recently diagnosed cirrhosis and liver mass who presented with alcohol withdrawal.        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 - Continue gabapentin 400 TID - Continue melatonin or trazodone for sleep PRN - Continue CIWA scoring with PRN Ativan - Continue folate and thiamine  Acute hepatic encephalopathy (HCC) Unchanged - Increase lactulose           Subjective: Still somnolent and somewhat dazed.  No fever, no vomiting.  Still tremulous.     Physical Exam: BP 100/71 (BP Location: Right Arm)   Pulse 91   Temp 98.6 F (37 C) (Oral)   Resp 18   Ht 5' 7"$  (1.702 m)   Wt 84.5 kg   SpO2 100%   BMI 29.16 kg/m   Slightly jaundiced adult male, sleepy, somnolent, weak Tachycardic, regular, no murmurs, mild peripheral edema Respiratory rate normal, lungs clear without rales or wheezes Abdomen soft without tenderness palpation, no ascites Attention diminsihed, psychomotor slowing noted, oriented to person, place, time, and situation, face symmetric, speech fluent, moves all extremities with normal strength and coordination    Data Reviewed: Comprehensive metabolic panel no change LFTs elevated INR 1.3, no change Complete blood count normal Cell count and paracentesis yesterday  Family Communication: None present    Disposition: Status is: Inpatient Remains slightly encephalppathic from hepatic  encephaloapthy.  Give more lactulose today, hopefully home tomorrow        Author: Edwin Dada, MD 08/24/2022 5:00 PM  For on call review [email or web address].

## 2022-08-24 NOTE — TOC Progression Note (Signed)
Transition of Care [facility]) - Progression Note    Patient Details  Name: Maverik Bueti MRN: [id number] Date of Birth: [birth year]  Transition of [facility]) CM/SW [place], LCSW Phone Number: 08/24/2022, 1:38 PM  Clinical Narrative:    CSW received a call from pt's insurance company to discuss D/C needs. TOC only provides resources for S/A. Pt was provided with SA resources 2/25. CSW informed the insurance company the pt will have to follow up to see who has a bed if he is looking for inpatient SA. The insurance worker provided her name and contact number Kathlee Nations [phone]. No Additional need, TOC sign-off.       Expected Discharge Plan: Home/Self Care Barriers to Discharge: Continued Medical Work up  Expected Discharge Plan and Services   Discharge Planning Services: CM Consult Post Acute Care Choice: NA Living arrangements for the past 2 months: Single Family Home                                       Social Determinants of Health (SDOH) Interventions SDOH Screenings   Food Insecurity: No Food Insecurity (08/22/2022)  Housing: Low Risk  (08/22/2022)  Transportation Needs: No Transportation Needs (08/22/2022)  Utilities: Not At Risk (08/22/2022)  Depression (PHQ2-9): High Risk (08/19/2022)  Tobacco Use: Medium Risk (08/21/2022)    Readmission Risk Interventions     No data to display

## 2022-08-25 ENCOUNTER — Other Ambulatory Visit (HOSPITAL_COMMUNITY): Payer: Self-pay

## 2022-08-25 DIAGNOSIS — K7290 Hepatic failure, unspecified without coma: Secondary | ICD-10-CM | POA: Diagnosis not present

## 2022-08-25 LAB — CYTOLOGY - NON PAP

## 2022-08-25 MED ORDER — GABAPENTIN 400 MG PO CAPS
400.0000 mg | ORAL_CAPSULE | Freq: Three times a day (TID) | ORAL | 3 refills | Status: DC
  Filled 2022-08-25: qty 90, 30d supply, fill #0

## 2022-08-25 MED ORDER — NALTREXONE HCL 50 MG PO TABS: 50.0000 mg | ORAL_TABLET | Freq: Every day | ORAL | 3 refills | Status: DC

## 2022-08-25 MED ORDER — FUROSEMIDE 40 MG PO TABS
40.0000 mg | ORAL_TABLET | Freq: Every day | ORAL | 3 refills | Status: DC
  Filled 2022-08-25: qty 30, 30d supply, fill #0

## 2022-08-25 MED ORDER — LACTULOSE 10 GM/15ML PO SOLN
30.0000 g | Freq: Three times a day (TID) | ORAL | 3 refills | Status: DC
  Filled 2022-08-25: qty 236, 2d supply, fill #0

## 2022-08-25 MED ORDER — FOLIC ACID 1 MG PO TABS
1.0000 mg | ORAL_TABLET | Freq: Every day | ORAL | 3 refills | Status: AC
  Filled 2022-08-25: qty 30, 30d supply, fill #0

## 2022-08-25 MED ORDER — SPIRONOLACTONE 100 MG PO TABS
100.0000 mg | ORAL_TABLET | Freq: Every day | ORAL | 3 refills | Status: DC
  Filled 2022-08-25: qty 30, 30d supply, fill #0

## 2022-08-25 MED ORDER — TRAZODONE HCL 50 MG PO TABS
50.0000 mg | ORAL_TABLET | Freq: Every day | ORAL | 3 refills | Status: DC
  Filled 2022-08-25: qty 30, 30d supply, fill #0

## 2022-08-25 MED ORDER — POTASSIUM CHLORIDE CRYS ER 10 MEQ PO TBCR
10.0000 meq | EXTENDED_RELEASE_TABLET | Freq: Every day | ORAL | 3 refills | Status: DC
  Filled 2022-08-25: qty 30, 30d supply, fill #0

## 2022-08-25 MED ORDER — VITAMIN B-1 100 MG PO TABS
100.0000 mg | ORAL_TABLET | Freq: Every day | ORAL | 3 refills | Status: AC
  Filled 2022-08-25: qty 100, 100d supply, fill #0

## 2022-08-25 MED ORDER — ACAMPROSATE CALCIUM 333 MG PO TBEC: 666.0000 mg | DELAYED_RELEASE_TABLET | Freq: Three times a day (TID) | ORAL | 11 refills | Status: DC

## 2022-08-25 NOTE — Discharge Summary (Signed)
Physician Discharge Summary   Patient: John Maxwell MRN: [id number] DOB: 04-Dec-1980  Admit date:     08/21/2022  Discharge date: 08/25/22  Discharge Physician: Edwin Dada   PCP: Loralee Pacas, MD     Recommendations at discharge:  Follow up with PCP Dr. Randol Kern in 1 week for cirrhosis, hepatic encephalopathy, alcohol withdrawal Dr. Randol Kern: Please reconcile acamprosate/naltrexone Please encourage patient in seeking substance use treatment Please taper off gabapentin in 4-6 weeks Please monitor stool output and titrate lactulose as needed Please recheck folate in 8-12 weeks Liver mass has been ruled out with MRI  Follow up with [facility] in 4-6 weeks     Discharge Diagnoses: Principal Problem:   Liver failure without hepatic coma ([place]) Active Problems:   Alcohol withdrawal ([place])   Acute hepatic encephalopathy (HCC)   Liver mass, ruled out   Alcohol dependence with hallucinations ([place])   Hyponatremia   Hypokalemia   Anemia   Thrombocytopenia   Folate deficiency      Hospital Course: John Maxwell is a 42 y.o. M with hx alcohol dependence, prior alcohol withdrawal seizure, and recently diagnosed cirrhosis and liver mass who presented with alcohol withdrawal.    Recently evaluated in ER for symptoms of hepatitis, noted to have imaging correlates of cirrhosis.  Seen in follow up by [facility], [place] elastography confirmed cirrhosis.  Recently PCP started acamprosate.    Two days prior to admission, patient stopped drinking, developed symptoms of withdrawal and came to the ER.       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   Ammonia 53.    Started lactulose and mentation improved.     Alcohol withdrawal ([place]) Patient has history complicated withdrawal.    Here he was started on gabapentin.  Symptoms waned by discharge, and he was discharged on gabapentin and trazodone.    Folate deficiency Discharged on new folate.  Thrombocytopenia Due to cirrhosis  Anemia Due to alcohol and folate deficiency  Liver mass, ruled out Possible mass seen on CT earlier this month.  MRI obtained this admission, discussed with Dr. Romelle Starcher, no mass is apparent on MRI.            The [facility] Controlled Substances Registry was reviewed for this patient prior to discharge.   Consultants:  Gastroenterology, [facility]ie [facility]  Procedures performed:  Paracentesis MRI abdomen   Disposition: Home Diet recommendation:  Discharge Diet Orders (From admission, onward)     Start     Ordered   08/25/22 0000  Diet - low sodium heart healthy        08/25/22 1050             DISCHARGE MEDICATION: Allergies as of 08/25/2022       Reactions   Flexeril [cyclobenzaprine] Anaphylaxis        Medication List     STOP taking these medications    ondansetron 4 MG disintegrating tablet Commonly known as: ZOFRAN-ODT   oxazepam 10 MG capsule Commonly known as: SERAX       TAKE these medications    acamprosate 333 MG tablet Commonly known as: CAMPRAL Take 2 tablets (666 mg total) by mouth 3 (three) times daily with meals.   Constulose 10 GM/15ML solution Generic drug:  lactulose Take 45 mLs by mouth 3 times daily.   folic acid 1 MG tablet Commonly known as: FOLVITE Take 1 tablet (1 mg total) by mouth daily. Start taking on: August 26, 2022   furosemide 40 MG tablet Commonly known as: LASIX Take 1 tablet (40 mg total) by mouth daily. Start taking on: August 26, 2022 What changed:  medication strength how much to take   gabapentin 400 MG capsule Commonly known as: NEURONTIN Take 1 capsule (400 mg  total) by mouth 3 (three) times daily.   naltrexone 50 MG tablet Commonly known as: DEPADE Take 1 tablet (50 mg total) by mouth daily.   potassium chloride 10 MEQ tablet Commonly known as: KLOR-CON M Take 1 tablet (10 mEq total) by mouth daily. Start taking on: August 26, 2022   spironolactone 100 MG tablet Commonly known as: ALDACTONE Take 1 tablet (100 mg total) by mouth daily. Start taking on: August 26, 2022 What changed:  medication strength how much to take   thiamine 100 MG tablet Commonly known as: Vitamin B-1 Take 1 tablet (100 mg total) by mouth daily. Start taking on: August 26, 2022   traZODone 50 MG tablet Commonly known as: DESYREL Take 1 tablet (50 mg total) by mouth at bedtime.        Follow-up Information     Loralee Pacas, MD. Schedule an appointment as soon as possible for a visit in 1 week(s).   Specialty: Internal Medicine Contact information: [place] [address] [address], [place], [place]. Schedule an appointment as soon as possible for a visit in 1 month(s).   Specialty: Gastroenterology Contact information: [place] [address] [phone]                 Discharge Instructions     Diet - low sodium heart healthy   Complete by: As directed    Discharge instructions   Complete by: As directed    **IMPORTANT DISCHARGE INSTRUCTIONS**   From Dr. Loleta Books: You were admitted with liver failure and alcohol withdrawal and "hepatic encephalopathy"  Hepatic encephalopathy is when ammonia builds up from liver failure and makes you sleepy and confused.   For the liver disease: Take diuretics to keep the fluid off (Lasix and spironolactone) Take lactulose to clear ammonia in the bowel movements  Take spironolactone 100 mg daily Take furosemide/Lasix 40 mg daily Take a potassium supplement with Lasix, as it can lower your potassium level  Have your  primary doctor check your labs IN ONE WEEK  Take lactulose 45 mL three times daily Aim to have 3 soft BMs per day If you are having more than that, reduce the lactulose (go down to 30 mL three times a day, then 30 mL twice daily, etc.)      For the alcohol withdrawal symptoms: Take gabapentin 400 mg three times daily for the next month, then taper off If you find gabapentin makes you sleepy, you can take it just twice a day, or ask your primary doctor to reduce the dose  For insomnia, take trazodone 50 mg nightly as needed    Ask your doctor if you should take naltrexone, acamprosate or both  Find a substance use treatment center Ask around for one that has an intensive outpatient program   Do not take oxazepam, as this can increase the chance that you will go back to using alcohol  Lastly, your folic acid level was low --> Take folate 1 mg once daily and have your doctor check your level in 1 month   Increase activity slowly   Complete by: As directed        Discharge Exam: Filed Weights   08/21/22 1427  Weight: 84.5 kg    General: Pt is alert, awake, not in acute distress, icteric Cardiovascular: RRR, nl S1-S2, no murmurs appreciated.   No LE edema.   Respiratory: Normal respiratory rate and rhythm.  CTAB without rales or wheezes. Abdominal: Abdomen soft and non-tender. Somewhat distended, no significant ascites.   Neuro/Psych: Strength symmetric in upper and lower extremities.  Judgment and insight appear normal.   Condition at discharge: good  The results of significant diagnostics from this hospitalization (including imaging, microbiology, ancillary and laboratory) are listed below for reference.   Imaging Studies: US Paracentesis  Result Date: 08/23/2022 INDICATION: Patient with history of alcoholic cirrhosis, substance abuse, right hepatic lobe mass, ascites. Request received for diagnostic and therapeutic paracentesis up to 2 liters. EXAM: ULTRASOUND  GUIDED DIAGNOSTIC AND THERAPEUTIC PARACENTESIS MEDICATIONS: 10 mL 1% lidocaine COMPLICATIONS: None immediate. PROCEDURE: Informed written consent was obtained from the patient after a discussion of the risks, benefits and alternatives to treatment. A timeout was performed prior to the initiation of the procedure. Initial ultrasound scanning demonstrates a moderate amount of ascites within the right lower abdominal quadrant. The right lower abdomen was prepped and draped in the usual sterile fashion. 1% lidocaine was used for local anesthesia. Following this, a 19 gauge, 7-cm, Yueh catheter was introduced. An ultrasound image was saved for documentation purposes. The paracentesis was performed. The catheter was removed and a dressing was applied. The patient tolerated the procedure well without immediate post procedural complication. FINDINGS: A total of approximately 2 liters of yellow fluid was removed. Samples were sent to the laboratory as requested by the clinical team. IMPRESSION: Successful ultrasound-guided diagnostic and therapeutic paracentesis yielding 2 liters of peritoneal fluid. This is the patient's first paracentesis. Read by: Rowe Robert, PA-C PLAN: If the patient eventually requires >/=2 paracenteses in a 30 day period, candidacy for formal evaluation by the [facility] will be assessed. Michaelle Birks, MD [facility] Electronically Signed   By: Michaelle Birks M.D.   On: 08/23/2022 13:02   MR ABDOMEN MRCP W WO CONTAST  Result Date: 08/22/2022 CLINICAL DATA:  Cirrhosis.  Possible hepatic mass on recent CT. EXAM: MRI ABDOMEN WITHOUT AND WITH CONTRAST (INCLUDING MRCP) TECHNIQUE: Multiplanar multisequence MR imaging of the abdomen was performed both before and after the administration of intravenous contrast. Heavily T2-weighted images of the biliary and pancreatic ducts were obtained, and  three-dimensional MRCP images were rendered by post processing. CONTRAST:  8.48m GADAVIST GADOBUTROL 1 MMOL/ML IV SOLN COMPARISON:  CT on 07/31/2022 FINDINGS: Lower chest: No acute findings. Hepatobiliary: Hepatic cirrhosis is demonstrated. Recanalization of paraumbilical veins is seen, consistent with portal venous hypertension. Geographic pattern of hepatic steatosis is seen which predominantly involves the left hepatic lobe. No hepatic masses are identified. The portal and hepatic veins are patent. Gallbladder is unremarkable. No evidence of biliary ductal dilatation or choledocholithiasis. Pancreas:  No mass or inflammatory changes. Spleen:  Within normal limits in size and appearance. Adrenals/Urinary Tract: No suspicious masses identified. No evidence of hydronephrosis. Stomach/Bowel: Unremarkable. Vascular/Lymphatic: No pathologically enlarged lymph nodes identified. No acute vascular findings. Upper abdominal portosystemic venous collaterals are seen, consistent with portal venous hypertension.  Other:  Diffuse mesenteric edema and moderate ascites is seen. Musculoskeletal:  No suspicious bone lesions identified. IMPRESSION: Hepatic cirrhosis and findings of portal venous hypertension. Geographic pattern of hepatic steatosis predominantly involving the left hepatic lobe. No evidence of hepatic neoplasm. Moderate ascites and diffuse mesenteric edema. Electronically Signed   By: Marlaine Hind M.D.   On: 08/22/2022 17:03   MR 3D Recon At Scanner  Result Date: 08/22/2022 CLINICAL DATA:  Cirrhosis.  Possible hepatic mass on recent CT. EXAM: MRI ABDOMEN WITHOUT AND WITH CONTRAST (INCLUDING MRCP) TECHNIQUE: Multiplanar multisequence MR imaging of the abdomen was performed both before and after the administration of intravenous contrast. Heavily T2-weighted images of the biliary and pancreatic ducts were obtained, and three-dimensional MRCP images were rendered by post processing. CONTRAST:  8.92m GADAVIST  GADOBUTROL 1 MMOL/ML IV SOLN COMPARISON:  CT on 07/31/2022 FINDINGS: Lower chest: No acute findings. Hepatobiliary: Hepatic cirrhosis is demonstrated. Recanalization of paraumbilical veins is seen, consistent with portal venous hypertension. Geographic pattern of hepatic steatosis is seen which predominantly involves the left hepatic lobe. No hepatic masses are identified. The portal and hepatic veins are patent. Gallbladder is unremarkable. No evidence of biliary ductal dilatation or choledocholithiasis. Pancreas:  No mass or inflammatory changes. Spleen:  Within normal limits in size and appearance. Adrenals/Urinary Tract: No suspicious masses identified. No evidence of hydronephrosis. Stomach/Bowel: Unremarkable. Vascular/Lymphatic: No pathologically enlarged lymph nodes identified. No acute vascular findings. Upper abdominal portosystemic venous collaterals are seen, consistent with portal venous hypertension. Other:  Diffuse mesenteric edema and moderate ascites is seen. Musculoskeletal:  No suspicious bone lesions identified. IMPRESSION: Hepatic cirrhosis and findings of portal venous hypertension. Geographic pattern of hepatic steatosis predominantly involving the left hepatic lobe. No evidence of hepatic neoplasm. Moderate ascites and diffuse mesenteric edema. Electronically Signed   By: JMarlaine HindM.D.   On: 08/22/2022 17:03   [place]dom[place] Limited RUQ (LIVER/GB)  Result Date: 08/21/2022 CLINICAL DATA:  Elevated LFTs. EXAM: ULTRASOUND ABDOMEN LIMITED RIGHT UPPER QUADRANT COMPARISON:  Hepatic elastography 08/18/2022, 3 days prior. CT 07/31/2022 FINDINGS: Gallbladder: Distended. No gallstones or wall thickening visualized. No sonographic Murphy sign noted by sonographer. Common bile duct: Obscured and not demonstrated on the current exam. There is no intrahepatic biliary ductal dilatation. Liver: Diffusely increased and coarsened hepatic parenchymal echogenicity. Liver parenchyma is difficult to penetrate.  There is subtle capsular nodularity. No definite focal lesion, particularly no lesion corresponding to that questioned on recent CT. Flow within the main portal vein was difficult to demonstrated on the current exam, was recently demonstrated to be reversed. There may be slow flow within the main portal vein, however difficult to accurately characterize. To and fro flow may be seen on cine clip. Other: Moderate ascites. IMPRESSION: 1. Findings consistent with hepatic cirrhosis. There is no focal lesion, although assessment is technically limited. Hypodense lesion was questioned on CT earlier today. Consider further assessment with MRI. 2. Nonspecific gallbladder distension. No evidence of acute cholecystitis. 3. 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 Slow flow that is to and fro was potentially seen. Recent hepatic elastography demonstrated reversal of flow in the main portal vein 3 days ago. Recommend further assessment with contrast-enhanced CT or MRI. 4. Moderate ascites. Electronically Signed   By: MKeith RakeM.D.   On: 08/21/2022 18:43   [place]AST[place]GRAPHY LIVER  Result Date: 08/18/2022 CLINICAL DATA:  Alcohol use EXAM: [place]AST[place]APHY HEPATIC TECHNIQUE: Sonography of the liver was performed.  In addition, ultrasound elastography evaluation of the liver was performed. A region of interest was placed within the right lobe of the liver. Following application of a compressive sonographic pulse, tissue compressibility was assessed. Multiple assessments were performed at the selected site. Median tissue compressibility was determined. Previously, hepatic stiffness was assessed by shear wave velocity. Based on recently published Society of Radiologists in Ultrasound consensus article, reporting is now recommended to be performed in the SI units of pressure (kiloPascals) representing hepatic stiffness/elasticity. The obtained result is  compared to the published reference standards. (cACLD = compensated Advanced Chronic Liver Disease) COMPARISON:  None Available. FINDINGS: Liver: Severely increased echotexture throughout the liver compatible with fatty infiltration and/or intrinsic liver disease. Mildly nodular contours compatible with cirrhosis. Reversal of flow in the main portal vein. ULTRASOUND HEPATIC ELASTOGRAPHY Device: Siemens Helix VTQ Patient position: Supine Transducer: 9C2 Number of measurements: 10 Hepatic segment:  8 Median kPa: 36.6 IQR: 56.9 IQR/Median kPa ratio: 1.6 Data quality: IQR/Median kPa ratio of 0.3 or greater indicates reduced accuracy Diagnostic category: > or =17 kPa: highly suggestive of cACLD with an increased probability of clinically significant portal hypertension The use of hepatic elastography is applicable to patients with viral hepatitis and non-alcoholic fatty liver disease. At this time, there is insufficient data for the referenced cut-off values and use in other causes of liver disease, including alcoholic liver disease. Patients, however, may be assessed by elastography and serve as their own reference standard/baseline. In patients with non-alcoholic liver disease, the values suggesting compensated advanced chronic liver disease (cACLD) may be lower, and patients may need additional testing with elasticity results of 7-9 kPa. Please note that abnormal hepatic elasticity and shear wave velocities may also be identified in clinical settings other than with hepatic fibrosis, such as: acute hepatitis, elevated right heart and central venous pressures including use of beta blockers, veno-occlusive disease (Budd-Chiari), infiltrative processes such as mastocytosis/amyloidosis/infiltrative tumor/lymphoma, extrahepatic cholestasis, with hyperemia in the post-prandial state, and with liver transplantation. Correlation with patient history, laboratory data, and clinical condition recommended. Diagnostic Categories:  < or =5 kPa: high probability of being normal < or =9 kPa: in the absence of other known clinical signs, rules out cACLD >9 kPa and ?13 kPa: suggestive of cACLD, but needs further testing >13 kPa: highly suggestive of cACLD > or =17 kPa: highly suggestive of cACLD with an increased probability of clinically significant portal hypertension IMPRESSION: ULTRASOUND LIVER: Severe diffuse fatty infiltration of the liver with changes of cirrhosis. Reversal of flow in the main portal vein. ULTRASOUND HEPATIC ELASTOGRAPHY: Median kPa:  36.6 Diagnostic category: > or =17 kPa: highly suggestive of cACLD with an increased probability of clinically significant portal hypertension Electronically Signed   By: Rolm Baptise M.D.   On: 08/18/2022 14:54   CT ABDOMEN PELVIS W CONTRAST  Result Date: 07/31/2022 CLINICAL DATA:  Abdominal pain, acute, nonlocalized. Pt c/o generalized abd pain and distention about 1.5 wks ago; reports passing small amt of stool EXAM: CT ABDOMEN AND PELVIS WITH CONTRAST TECHNIQUE: Multidetector CT imaging of the abdomen and pelvis was performed using the standard protocol following bolus administration of intravenous contrast. RADIATION DOSE REDUCTION: This exam was performed according to the departmental dose-optimization program which includes automated exposure control, adjustment of the mA and/or kV according to patient size and/or use of iterative reconstruction technique. CONTRAST:  15m OMNIPAQUE IOHEXOL 300 MG/ML  SOLN COMPARISON:  None Available. FINDINGS: Lower chest: No acute abnormality. Hepatobiliary: Hepatic steatosis. Coarsened heterogeneous hepatic parenchyma with enlarged left  hepatic lobe. Question poorly defined 4.1 cm juxta caval mass within the right hepatic lobe (2:12). Nonspecific hydropic gallbladder. No gallstones, gallbladder wall thickening, or pericholecystic fluid. No biliary dilatation. Pancreas: No focal lesion. Normal pancreatic contour. No surrounding inflammatory  changes. No main pancreatic ductal dilatation. Spleen: Normal in size without focal abnormality. Adrenals/Urinary Tract: No adrenal nodule bilaterally. Bilateral kidneys enhance symmetrically. Punctate calcification within the right kidney. No left nephrolithiasis. No ureterolithiasis bilaterally. No hydronephrosis. No hydroureter. The urinary bladder is unremarkable. Stomach/Bowel: Stomach is within normal limits. No evidence of bowel wall thickening or dilatation. Fatty infiltration of the ascending colon wall suggestive of chronic inflammatory changes. Colonic diverticulosis. The appendix is not definitely identified with no inflammatory changes in the right lower quadrant to suggest acute appendicitis. Vascular/Lymphatic: The portal, splenic, superior mesenteric veins are patent. Limited evaluation of the hepatic veins. Query mass effect of possible hepatic mass on to the inferior vena cava (5:45, 2:12). Recanalized paraumbilical vein. No abdominal aorta or iliac aneurysm. Mild atherosclerotic plaque of the aorta and its branches. No abdominal, pelvic, or inguinal lymphadenopathy. Reproductive: Status post hysterectomy. No adnexal masses. Other: At least small volume simple free fluid. No intraperitoneal free gas. No organized fluid collection. Musculoskeletal: IMPRESSION: 1. Hepatic steatosis with findings suggestive of cirrhotic morphology with portal hypertension. Question poorly defined 4.1 cm juxta caval mass within the right hepatic lobe-concern for hepatocellular carcinoma. When the patient is clinically stable and able to follow directions and hold their breath (preferably as an outpatient) further evaluation with dedicated MRI liver protocol is recommended. 2. At least small volume simple ascites. 3. Nonspecific hydropic gallbladder. 4. Nonobstructive punctate right nephrolithiasis. 5. Colonic diverticulosis with no acute diverticulitis. 6.  Aortic Atherosclerosis (ICD10-I70.0). Electronically Signed    By: Iven Finn M.D.   On: 07/31/2022 22:14    Microbiology: No results found for this or any previous visit.  Labs: CBC: Recent Labs  Lab 08/19/22 1516 08/21/22 1432 08/22/22 0408 08/23/22 0326 08/24/22 0417  WBC 9.3 13.3* 9.4 8.4 8.7  HGB 12.7* 12.0* 11.6* 11.1* 12.7*  HCT 36.9* 32.5* 32.9* 31.9* 36.3*  MCV 117.5 Repeated and verified X2.* 108.3* 111.5* 114.7* 117.5*  PLT 150.0 123* 105* 113* [id number]*   Basic Metabolic Panel: Recent Labs  Lab 08/19/22 1516 08/21/22 1432 08/22/22 0408 08/23/22 0326 08/24/22 0417  NA 134* 127* 126* 130* 131*  K 3.4* 3.1* 3.1* 3.4* 3.6  CL 87* 87* 89* 91* 94*  CO2 33* 30 31 32 31  GLUCOSE 95 100* 104* 111* 111*  BUN '6 9 12 10 9  '$ CREATININE 0.71 0.53* 0.61 0.59* 0.67  CALCIUM 8.2* 7.9* 7.7* 8.0* 8.0*   Liver Function Tests: Recent Labs  Lab 08/19/22 1516 08/21/22 1432 08/22/22 0408 08/23/22 0326 08/24/22 0417  AST 232* 257* 186* 155* 139*  ALT 56* 73* 60* 54* 54*  ALKPHOS 295* 232* 208* 189* 187*  BILITOT 3.6* 7.5* 6.3* 4.3* 4.5*  PROT 6.5 6.7 6.4* 6.1* 6.5  ALBUMIN 3.0* 2.5* 2.5* 2.4* 2.6*   CBG: No results for input(s): "GLUCAP" in the last 168 hours.  Discharge time spent: approximately 35 minutes spent on discharge counseling, evaluation of patient on day of discharge, and coordination of discharge planning with nursing, social work, pharmacy and case management  Signed: Edwin Dada, MD [facility] 08/25/2022

## 2022-08-30 ENCOUNTER — Ambulatory Visit (INDEPENDENT_AMBULATORY_CARE_PROVIDER_SITE_OTHER): Payer: BC Managed Care – PPO | Admitting: Internal Medicine

## 2022-08-30 ENCOUNTER — Encounter: Payer: Self-pay | Admitting: Internal Medicine

## 2022-08-30 VITALS — BP 112/78 | HR 94 | Temp 99.7°F | Ht 67.0 in | Wt 175.8 lb

## 2022-08-30 DIAGNOSIS — E871 Hypo-osmolality and hyponatremia: Secondary | ICD-10-CM

## 2022-08-30 DIAGNOSIS — E876 Hypokalemia: Secondary | ICD-10-CM | POA: Diagnosis not present

## 2022-08-30 DIAGNOSIS — T50905A Adverse effect of unspecified drugs, medicaments and biological substances, initial encounter: Secondary | ICD-10-CM

## 2022-08-30 DIAGNOSIS — K766 Portal hypertension: Secondary | ICD-10-CM

## 2022-08-30 DIAGNOSIS — K7011 Alcoholic hepatitis with ascites: Secondary | ICD-10-CM

## 2022-08-30 DIAGNOSIS — K7031 Alcoholic cirrhosis of liver with ascites: Secondary | ICD-10-CM

## 2022-08-30 DIAGNOSIS — G2581 Restless legs syndrome: Secondary | ICD-10-CM | POA: Diagnosis not present

## 2022-08-30 DIAGNOSIS — R160 Hepatomegaly, not elsewhere classified: Secondary | ICD-10-CM

## 2022-08-30 DIAGNOSIS — D538 Other specified nutritional anemias: Secondary | ICD-10-CM

## 2022-08-30 DIAGNOSIS — K7682 Hepatic encephalopathy: Secondary | ICD-10-CM

## 2022-08-30 HISTORY — DX: Alcoholic hepatitis with ascites: K70.11

## 2022-08-30 MED ORDER — ROPINIROLE HCL 0.25 MG PO TABS: 0.2500 mg | ORAL_TABLET | Freq: Three times a day (TID) | ORAL | 2 refills | Status: DC

## 2022-08-30 NOTE — Patient Instructions (Addendum)
[facility]): Address: [address], [place], [place], [address] Description: [facility] is a private, nonprofit supportive housing and outpatient treatment program for men and women in recovery from addiction. They offer comprehensive services to support individuals on their recovery journey1. Contact: Phone: [phone] Hours: Monday to Friday, 8:00 AM - 5:00 PM [facility] In[facility] Substance Abuse [facility]: Address: [address], [place], [place], [address] Description: [facility] provides inpatient substance abuse treatment to [facility] residents struggling with addiction. Their tailored approach includes assessing physical and mental health, history of substance use, and family involvement. They aim to create personalized recovery plans2. Contact: Phone: [phone] [facility]: Address: [place], [place], [place], [address] (near [place]) Description: [facility] center. They offer both mental health and substance abuse services, including ambulatory detox and intensive outpatient programs. Medication management is also available3. Contact: Phone: [phone] Hours: Monday, Wednesday, Friday: 9:00 AM - 9:00 PM; Tuesday, Thursday: 9:00 AM - [id number] PM     Certainly! If you're looking for Alcoholics Anonymous (AA) meetings near [place], [place], here are some options:  Alcoholics Anonymous [employer] [facility]: Description: [facility] serves [place], [place], [place], [place], [place], [place], [place], [place], [place], and [place]. They provide support for those experiencing alcohol use disorder. Website: [employer] Contact: You can find meeting schedules and contact information on their website. [facility]: Description: [facility] offers a [employer] in [place], [place]. The closest [place] meeting to you is approximately 3.27 miles away and meets on Saturdays at 8:00 AM. Website: [facility] Meetings in [place]: Address: [place], [place],  [address] (near [facility]) Description: While not exclusively AA, [facility] is a [facility] private outpatient behavioral health center that provides substance abuse services. They offer ambulatory detox and intensive outpatient programs. Contact: Phone: [phone] Hours: Monday, Wednesday, Friday: 9:00 AM - 9:00 PM; Tuesday, Thursday: 9:00 AM - 6:00 PM Remember, attending [place] meetings can be an essential part of your recovery journey. Reach out to these resources to find the support you need. ??   Please use self directed learning with [employer] or [employer]. I understand that lack of sleep, boredom, and stress can significantly impact your risk of relapse. Let's work together to create a personalized plan to mitigate these risks. Here are some strategies you can consider:  Prioritize Sleep: Establish a Consistent Sleep Schedule: Go to bed and wake up at the same time every day, even on weekends. Consistency helps regulate your body's internal clock. Create a Relaxing Bedtime Routine: Engage in calming activities before bed, such as reading, deep breathing, or gentle stretching. Limit Screen Time: Avoid screens (phones, computers, TVs) at least an hour before bedtime. The blue light emitted by screens can interfere with sleep. Combat Boredom: Find Engaging Activities: Identify hobbies or interests that captivate your attention. Whether it's painting, playing an instrument, or gardening, having fulfilling activities can reduce boredom. Connect with Others: Reach out to friends, family, or support groups. Social interactions can alleviate boredom and provide emotional support. Manage Stress: Practice Mindfulness: Engage in mindfulness  meditation, deep breathing exercises, or yoga. These techniques can help reduce stress and promote relaxation. Exercise Regularly: Physical activity releases endorphins, which are natural mood boosters. Aim for at least 30 minutes of moderate exercise  most days. Seek Professional Help: Consider therapy or counseling to address underlying stressors. Therapists can provide coping strategies and emotional support. Create a Relapse Prevention Plan: Identify Triggers: Recognize situations or emotions that lead to relapse. Write them down and develop strategies to cope with them. Emergency Contacts: Keep a list of emergency contacts (friends, family, sponsor) you can reach out to when you're struggling. Positive Affirmations: Create positive affirmations to repeat during challenging moments. Remind yourself of your commitment to sobriety.

## 2022-08-30 NOTE — Progress Notes (Signed)
Note created in error.

## 2022-08-30 NOTE — Assessment & Plan Note (Signed)
Encouraged continuing with lactulose despite diarrhea Recommmend. Moisturizing toilet paper and/or bidet

## 2022-08-30 NOTE — Progress Notes (Signed)
[facility]: [id number]   Routine Medical Office Visit  Patient:  John Maxwell      Age: 42 y.o.       Sex:  male  Date:   08/30/2022  PCP:    Loralee Pacas, [place] Provider: Loralee Pacas, MD   Assessment and Plan:   I reviewed the hospitalization discharge summary and did go over everything on it with Mr.John Maxwell and his wife today.   John Maxwell was seen today for labs only and hospitalization follow-up.  Alcoholic hepatitis with ascites  Portal hypertension (HCC)  Restless legs -     rOPINIRole HCl; Take 1 tablet (0.25 mg total) by mouth 3 (three) times daily.  Dispense: 30 tablet; Refill: 2  Drug-induced hypokalemia  Hyponatremia -     Basic metabolic panel  Alcoholic cirrhosis of liver with ascites (HCC) Overview: Demonstrated on CT abdomen 07/2022   Liver mass, ruled out Overview: 4.1 cm mass seen on CT, not seen on mrcp   Other specified nutritional anemias Overview: Associated with vitamin deficiency and cirrhosis Lab Results  Component Value Date/Time   MCV 117.5 (H) 08/24/2022 04:17 AM   MCV 114.7 (H) 08/23/2022 03:26 AM   MCV 111.5 (H) 08/22/2022 04:08 AM   MCV 108.3 (H) 08/21/2022 02:32 PM   MCV 117.5 Repeated and verified X2. (H) 08/19/2022 03:16 PM   Lab Results  Component Value Date/Time   HGB 12.7 (L) 08/24/2022 04:17 AM   HGB 11.1 (L) 08/23/2022 03:26 AM   HGB 11.6 (L) 08/22/2022 04:08 AM   HGB 12.0 (L) 08/21/2022 02:32 PM   HGB 12.7 (L) 08/19/2022 03:16 PM      Encephalopathy, hepatic (HCC) Assessment & Plan: Encouraged continuing with lactulose despite diarrhea Recommmend. Moisturizing toilet paper and/or bidet   I ordered the basic metabolic panel to follow-up on the sodium potassium on diuretics but he is all stuck and sore from the hospital and declined to do it today.  I asked about restless legs as a way of investigating the cause of his tendency to relapse that I found out about  the restless legs and I decided to start him on a low-dose and see how it does for [place].  I talked about how the liver is very enlarged on the imaging tests and in combination with the AST and ALT ratios it looks like this is just really severe liver inflammation from alcohol and that there is no tumor that that is been ruled out by the MRI done at the hospital so we can cancel that.  I showed images of the scans to the patient's wife and the patient today and independently interpreted both the original CAT scan that found a possible tumor as well as the MRI that ruled out later.  I have worked with him to help develop a plan to avoid drinking in the future and I stopped naltrexone from his medicine list which he was never able to pick up and now that he is going to stay abstinent from alcohol does not need.  He will just use acamprosate and gabapentin and I did review the discharge summary recommendation to taper it but I felt like he should stay on it longer than 4 to 6 weeks to help control his risk of seizure and alcohol withdrawal cravings for using it as an off label treatment for alcohol use disorder.      Clinical Presentation:   The patient is a 42 y.o. 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   Hyponatremia 08/21/2022   Drug-induced hypokalemia 08/21/2022   Anemia 08/21/2022   Thrombocytopenia 08/22/2022   Folate deficiency 08/22/2022   Encephalopathy, hepatic ([place]) 08/23/2022   Portal hypertension ([place]) [id number]   Alcoholic hepatitis with ascites 08/30/2022   Restless legs 08/30/2022   Resolved Ambulatory Problems    Diagnosis Date Noted   Delirium tremens ([place]) 08/20/2022   Alcohol  withdrawal ([place]) 08/21/2022   Past Medical History:  Diagnosis Date   Acute hepatic encephalopathy ([place]) 08/23/2022   Anxiety    Depression    Substance abuse ([place])     Outpatient Medications Prior to Visit  Medication Sig   acamprosate (CAMPRAL) 333 MG tablet Take 2 tablets (666 mg total) by mouth 3 (three) times daily with meals.   folic acid (FOLVITE) 1 MG tablet Take 1 tablet (1 mg total) by mouth daily.   furosemide (LASIX) 40 MG tablet Take 1 tablet (40 mg total) by mouth daily.   gabapentin (NEURONTIN) 400 MG capsule Take 1 capsule (400 mg total) by mouth 3 (three) times daily.   lactulose (CHRONULAC) 10 GM/15ML solution Take 45 mLs by mouth 3 times daily.   potassium chloride (KLOR-CON M) 10 MEQ tablet Take 1 tablet (10 mEq total) by mouth daily.   spironolactone (ALDACTONE) 100 MG tablet Take 1 tablet (100 mg total) by mouth daily.   thiamine (VITAMIN B-1) 100 MG tablet Take 1 tablet (100 mg total) by mouth daily.   traZODone (DESYREL) 50 MG tablet Take 1 tablet (50 mg total) by mouth at bedtime.   [DISCONTINUED] naltrexone (DEPADE) 50 MG tablet Take 1 tablet (50 mg total) by mouth daily.   No facility-administered medications prior to visit.     Chief Complaint  Patient presents with   Labs Only    Had some abnormal labs at the hospital, was there 2/24-2/27.   Hospitalization Follow-up    HPI  Patient returns from the hospital but technically this is not a TCM visit.  He was rehospitalized for alcohol withdrawal seizures and possible hepatic encephalopathy.  He has been home for a few days now and not having any seizures.  He is taking lactulose and complains of the diarrhea from it.  We did a med reconciliation and results are below.  I encouraged him to keep doing what he is currently been doing which is staying completely abstinent since the hospital with the intent to stay completely abstinent going forward.  He was never able to get the naltrexone.  He reports that  stress and insomnia are a contributor to potential relapse and that the major cause of his insomnia is restless legs         Clinical Data Analysis:  Physical Exam  BP 112/78 (BP Location: Right Arm, Patient Position: Sitting)   Pulse 94   Temp 99.7 F (37.6 C) (Temporal)   Ht '5\' 7"'$  (1.702 m)   Wt 175 lb 12.8 oz (79.7 kg)   SpO2 99%   BMI 27.53 kg/m  Wt Readings from Last 10 Encounters:  08/30/22 175 lb 12.8 oz (79.7 kg)  08/21/22 186 lb 3.2 oz (84.5 kg)  08/19/22 186 lb 3.2 oz (84.5  kg)  06/23/21 165 lb (74.8 kg)  05/22/18 180 lb (81.6 kg)  02/23/18 165 lb (74.8 kg)   Vital signs reviewed.  Nursing notes reviewed. Weight trend reviewed. Abnormalities noted: recent weight loss noted and Body mass index is 27.53 kg/m.  Overweight  by BMI criteria is noted but in my medical opinion, BMI is an unreliable indicator of healthy body composition due to its inability to reflect lean muscle mass.  He has lost lean muscle mass with large abdomen that has fluid accumulation. General Appearance:  Well developed, well nourished male in no acute distress.   Pulmonary:  Normal work of breathing at rest, no respiratory distress apparent. SpO2: 99 %  Musculoskeletal: All extremities are intact.  Neurological:  Awake, alert. No obvious focal neurological deficits or cognitive impairments.  Sensorium seems unclouded. Psychiatric:  Appropriate mood, pleasant demeanor Problem-specific findings:  less scleral icterus than prior   Results Reviewed: (reviewed labs/imaging may be also be found in the assessment / plan section):  No results found for any visits on 08/30/22.  Recent Results (from the past 2160 hour(s))  CBC with Differential     Status: Abnormal   Collection Time: 07/31/22  7:22 PM  Result Value Ref Range   WBC 8.3 4.0 - 10.5 K/uL   RBC 3.43 (L) 4.22 - 5.81 MIL/uL   Hemoglobin 13.3 13.0 - 17.0 g/dL   HCT 36.9 (L) 39.0 - 52.0 %   MCV 107.6 (H) 80.0 - 100.0 fL   MCH 38.8 (H) 26.0 -  34.0 pg   MCHC 36.0 30.0 - 36.0 g/dL   RDW 16.4 (H) 11.5 - 15.5 %   Platelets 128 (L) 150 - 400 K/uL   nRBC 0.0 0.0 - 0.2 %   Neutrophils Relative % 75 %   Neutro Abs 6.3 1.7 - 7.7 K/uL   Lymphocytes Relative 13 %   Lymphs Abs 1.1 0.7 - 4.0 K/uL   Monocytes Relative 10 %   Monocytes Absolute 0.8 0.1 - 1.0 K/uL   Eosinophils Relative 0 %   Eosinophils Absolute 0.0 0.0 - 0.5 K/uL   Basophils Relative 1 %   Basophils Absolute 0.1 0.0 - 0.1 K/uL   Immature Granulocytes 1 %   Abs Immature Granulocytes 0.05 0.00 - 0.07 K/uL    Comment: Performed at [facility], [address] [place], [place] [address]  Comprehensive metabolic panel     Status: Abnormal   Collection Time: 07/31/22  7:22 PM  Result Value Ref Range   Sodium 132 (L) 135 - 145 mmol/L   Potassium 3.4 (L) 3.5 - 5.1 mmol/L   Chloride 95 (L) 98 - 111 mmol/L   CO2 28 22 - 32 mmol/L   Glucose, Bld 137 (H) 70 - 99 mg/dL    Comment: Glucose reference range applies only to samples taken after fasting for at least 8 hours.   BUN <5 (L) 6 - 20 mg/dL    Comment: RESULTS VERIFIED BY REPEAT TESTING   Creatinine, Ser 0.59 (L) 0.61 - 1.24 mg/dL   Calcium 7.6 (L) 8.9 - 10.3 mg/dL   Total Protein 6.9 6.5 - 8.1 g/dL   Albumin 2.8 (L) 3.5 - 5.0 g/dL   AST 175 (H) 15 - 41 U/L   ALT 37 0 - 44 U/L   Alkaline Phosphatase 245 (H) 38 - 126 U/L   Total Bilirubin 2.6 (H) 0.3 - 1.2 mg/dL   GFR, Estimated >60 >60 mL/min    Comment: (NOTE)  Calculated using the CKD-EPI Creatinine Equation (2021)    Anion gap 9 5 - 15    Comment: Performed at [facility], [address] [place], [place] [address]  Lipase, blood     Status: None   Collection Time: 07/31/22  7:22 PM  Result Value Ref Range   Lipase 23 11 - 51 U/L    Comment: Performed at [facility], [address] [place], [place] [address]  Urinalysis, Routine w reflex microscopic -Urine, Clean Catch     Status: Abnormal   Collection Time: 07/31/22  7:37  PM  Result Value Ref Range   Color, Urine AMBER (A) YELLOW    Comment: BIOCHEMICALS MAY BE AFFECTED BY COLOR   APPearance CLEAR CLEAR   Specific Gravity, Urine 1.020 1.005 - 1.030   pH 6.0 5.0 - 8.0   Glucose, UA NEGATIVE NEGATIVE mg/dL   Hgb urine dipstick NEGATIVE NEGATIVE   Bilirubin Urine MODERATE (A) NEGATIVE   Ketones, ur NEGATIVE NEGATIVE mg/dL   Protein, ur 30 (A) NEGATIVE mg/dL   Nitrite NEGATIVE NEGATIVE   Leukocytes,Ua NEGATIVE NEGATIVE    Comment: Performed at [facility], [address] [place], [place] [address]  Urinalysis, Microscopic (reflex)     Status: Abnormal   Collection Time: 07/31/22  7:37 PM  Result Value Ref Range   RBC / HPF 0-5 0 - 5 RBC/hpf   WBC, UA 0-5 0 - 5 WBC/hpf   Bacteria, UA MANY (A) NONE SEEN   Squamous Epithelial / HPF 0-5 0 - 5 /HPF    Comment: Performed at [facility], [address] [place], [place] [address]  AFP tumor marker     Status: None   Collection Time: 08/19/22 12:01 PM  Result Value Ref Range   AFP-Tumor Marker 4.2 <6.1 ng/mL    Comment: . This test was performed using the Beckman Coulter chemiluminescent method. Values obtained from different assay methods cannot be used interchangeably. AFP levels, regardless of value, should not be interpreted as absolute evidence of the presence or absence of disease. .   Lipid panel     Status: Abnormal   Collection Time: 08/19/22 12:01 PM  Result Value Ref Range   Cholesterol 128 0 - 200 mg/dL    Comment: ATP III Classification       Desirable:  < 200 mg/dL               Borderline High:  200 - 239 mg/dL          High:  > = 240 mg/dL   Triglycerides 96.0 0.0 - 149.0 mg/dL    Comment: Normal:  <150 mg/dLBorderline High:  150 - 199 mg/dL   HDL 27.70 (L) >39.00 mg/dL   VLDL 19.2 0.0 - 40.0 mg/dL   LDL Cholesterol 81 0 - 99 mg/dL   Total CHOL/HDL Ratio 5     Comment:                Men          Women1/2 Average Risk     3.4          3.3Average Risk           5.0          4.42X Average Risk          9.6          7.13X Average Risk  15.0          11.0                       NonHDL 99.98     Comment: NOTE:  Non-HDL goal should be 30 mg/dL higher than patient's LDL goal (i.e. LDL goal of < 70 mg/dL, would have non-HDL goal of < 100 mg/dL)  TSH     Status: None   Collection Time: 08/19/22 12:01 PM  Result Value Ref Range   TSH 4.95 0.35 - 5.50 uIU/mL  HgB A1c     Status: Abnormal   Collection Time: 08/19/22 12:01 PM  Result Value Ref Range   Hgb A1c MFr Bld 4.3 (L) 4.6 - 6.5 %    Comment: Glycemic Control Guidelines for People with Diabetes:Non Diabetic:  <6%Goal of Therapy: <7%Additional Action Suggested:  >8%   Comprehensive metabolic panel     Status: Abnormal   Collection Time: 08/19/22  3:16 PM  Result Value Ref Range   Sodium 134 (L) 135 - 145 mEq/L   Potassium 3.4 (L) 3.5 - 5.1 mEq/L   Chloride 87 (L) 96 - 112 mEq/L   CO2 33 (H) 19 - 32 mEq/L   Glucose, Bld 95 70 - 99 mg/dL   BUN 6 6 - 23 mg/dL   Creatinine, Ser 0.71 0.40 - 1.50 mg/dL   Total Bilirubin 3.6 (H) 0.2 - 1.2 mg/dL   Alkaline Phosphatase 295 (H) 39 - 117 U/L   AST 232 (H) 0 - 37 U/L   ALT 56 (H) 0 - 53 U/L   Total Protein 6.5 6.0 - 8.3 g/dL   Albumin 3.0 (L) 3.5 - 5.2 g/dL   GFR 115.78 >60.00 mL/min    Comment: Calculated using the CKD-EPI Creatinine Equation (2021)   Calcium 8.2 (L) 8.4 - 10.5 mg/dL  CBC     Status: Abnormal   Collection Time: 08/19/22  3:16 PM  Result Value Ref Range   WBC 9.3 4.0 - 10.5 K/uL   RBC 3.14 (L) 4.22 - 5.81 Mil/uL   Platelets 150.0 150.0 - 400.0 K/uL   Hemoglobin 12.7 (L) 13.0 - 17.0 g/dL   HCT 36.9 (L) 39.0 - 52.0 %   MCV 117.5 Repeated and verified X2. (H) 78.0 - 100.0 fl   MCHC 34.5 30.0 - 36.0 g/dL   RDW 15.7 (H) 11.5 - 15.5 %  Osmolality, urine     Status: None   Collection Time: 08/21/22  5:09 AM  Result Value Ref Range   Osmolality, Ur 776 300 - 900 mOsm/kg    Comment: Performed at [place] [address], [place], [place] [address]  Comprehensive metabolic panel     Status: Abnormal   Collection Time: 08/21/22  2:32 PM  Result Value Ref Range   Sodium 127 (L) 135 - 145 mmol/L   Potassium 3.1 (L) 3.5 - 5.1 mmol/L   Chloride 87 (L) 98 - 111 mmol/L   CO2 30 22 - 32 mmol/L   Glucose, Bld 100 (H) 70 - 99 mg/dL    Comment: Glucose reference range applies only to samples taken after fasting for at least 8 hours.   BUN 9 6 - 20 mg/dL   Creatinine, Ser 0.53 (L) 0.61 - 1.24 mg/dL   Calcium 7.9 (L) 8.9 - 10.3 mg/dL   Total Protein 6.7 6.5 - 8.1 g/dL   Albumin 2.5 (L) 3.5 - 5.0 g/dL   AST 257 (H) 15 -  41 U/L   ALT 73 (H) 0 - 44 U/L   Alkaline Phosphatase 232 (H) 38 - 126 U/L   Total Bilirubin 7.5 (H) 0.3 - 1.2 mg/dL   GFR, Estimated >60 >60 mL/min    Comment: (NOTE) Calculated using the CKD-EPI Creatinine Equation (2021)    Anion gap 10 5 - 15    Comment: Performed at [facility], [address] [place], [place] [address]  Ethanol     Status: None   Collection Time: 08/21/22  2:32 PM  Result Value Ref Range   Alcohol, Ethyl (B) <10 <10 mg/dL    Comment: (NOTE) Lowest detectable limit for serum alcohol is 10 mg/dL.  For medical purposes only. Performed at [facility], [address] [place], [place] [address]   cbc     Status: Abnormal   Collection Time: 08/21/22  2:32 PM  Result Value Ref Range   WBC 13.3 (H) 4.0 - 10.5 K/uL   RBC 3.00 (L) 4.22 - 5.81 MIL/uL   Hemoglobin 12.0 (L) 13.0 - 17.0 g/dL   HCT 32.5 (L) 39.0 - 52.0 %   MCV 108.3 (H) 80.0 - 100.0 fL   MCH 40.0 (H) 26.0 - 34.0 pg   MCHC 36.9 (H) 30.0 - 36.0 g/dL   RDW 15.3 11.5 - 15.5 %   Platelets 123 (L) 150 - 400 K/uL    Comment: SPECIMEN CHECKED FOR CLOTS REPEATED TO VERIFY    nRBC 0.0 0.0 - 0.2 %    Comment: Performed at [facility], [address] [place], [place] [address]  Lipase, blood     Status: None   Collection Time: 08/21/22  2:32 PM  Result Value Ref Range   Lipase  39 11 - 51 U/L    Comment: Performed at [facility], [address] [place], [place] [address]  Protime-INR     Status: Abnormal   Collection Time: 08/21/22  3:41 PM  Result Value Ref Range   Prothrombin Time 16.1 (H) 11.4 - 15.2 seconds   INR 1.3 (H) 0.8 - 1.2    Comment: (NOTE) INR goal varies based on device and disease states. Performed at [facility], [address] [place], [place] [address]   Ammonia     Status: None   Collection Time: 08/21/22  3:41 PM  Result Value Ref Range   Ammonia 28 9 - 35 umol/L    Comment: Performed at [facility], [address] [place], [place] [address]  Rapid urine drug screen (hospital performed)     Status: None   Collection Time: 08/21/22  6:26 PM  Result Value Ref Range   Opiates NONE DETECTED NONE DETECTED   Cocaine NONE DETECTED NONE DETECTED   Benzodiazepines NONE DETECTED NONE DETECTED   Amphetamines NONE DETECTED NONE DETECTED   Tetrahydrocannabinol NONE DETECTED NONE DETECTED   Barbiturates NONE DETECTED NONE DETECTED    Comment: (NOTE) DRUG SCREEN FOR MEDICAL PURPOSES ONLY.  IF CONFIRMATION IS NEEDED FOR ANY PURPOSE, NOTIFY LAB WITHIN 5 DAYS.  LOWEST DETECTABLE LIMITS FOR URINE DRUG SCREEN Drug Class                     Cutoff (ng/mL) Amphetamine and metabolites    1000 Barbiturate and metabolites    200 Benzodiazepine                 200 Opiates  and metabolites        300 Cocaine and metabolites        300 THC                            50 Performed at [facility], [address] [place], [place] [address]   Sodium, urine, random     Status: None   Collection Time: 08/21/22  6:26 PM  Result Value Ref Range   Sodium, Ur <10 mmol/L    Comment: Performed at [place] [address] [place], [place] [address]  Chloride, urine, random     Status: None   Collection Time: 08/21/22  6:26 PM  Result Value Ref Range   Chloride Urine 19 mmol/L    Comment: Performed  at [place] [address], [place], [place] [address]  HIV Antibody (routine testing w rflx)     Status: None   Collection Time: 08/21/22 10:12 PM  Result Value Ref Range   HIV Screen 4th Generation wRfx Non Reactive Non Reactive    Comment: Performed at [facility], [place] [address] [place], [place] [address]  Osmolality     Status: None   Collection Time: 08/21/22 10:12 PM  Result Value Ref Range   Osmolality 283 275 - 295 mOsm/kg    Comment: PERFORMED AT [facility] Performed at [facility], [place] [address] [place], [place] [address]   Vitamin B12     Status: None   Collection Time: 08/21/22 10:12 PM  Result Value Ref Range   Vitamin B-12 452 180 - 914 pg/mL    Comment: (NOTE) This assay is not validated for testing neonatal or myeloproliferative syndrome specimens for Vitamin B12 levels. Performed at [facility], [place] [address] [place], [place] [address]   Folate     Status: Abnormal   Collection Time: 08/21/22 10:12 PM  Result Value Ref Range   Folate 1.6 (L) >5.9 ng/mL    Comment: Performed at [facility], [place] [address] [place], [place] [address]  CBC     Status: Abnormal   Collection Time: 08/22/22  4:08 AM  Result Value Ref Range   WBC 9.4 4.0 - 10.5 K/uL   RBC 2.95 (L) 4.22 - 5.81 MIL/uL   Hemoglobin 11.6 (L) 13.0 - 17.0 g/dL   HCT 32.9 (L) 39.0 - 52.0 %   MCV 111.5 (H) 80.0 - 100.0 fL   MCH 39.3 (H) 26.0 - 34.0 pg   MCHC 35.3 30.0 - 36.0 g/dL   RDW 15.9 (H) 11.5 - 15.5 %   Platelets 105 (L) 150 - 400 K/uL   nRBC 0.2 0.0 - 0.2 %    Comment: Performed at [facility], [place] [address], [place], [place] [address]  Comprehensive metabolic panel     Status: Abnormal   Collection Time: 08/22/22  4:08 AM  Result Value Ref Range   Sodium 126 (L) 135 - 145 mmol/L   Potassium 3.1 (L) 3.5 - 5.1 mmol/L   Chloride 89 (L) 98 - 111 mmol/L   CO2 31 22 - 32 mmol/L   Glucose, Bld 104  (H) 70 - 99 mg/dL    Comment: Glucose reference range applies only to samples taken after fasting for at least 8 hours.   BUN 12 6 - 20 mg/dL   Creatinine, Ser 0.61 0.61 - 1.24  mg/dL   Calcium 7.7 (L) 8.9 - 10.3 mg/dL   Total Protein 6.4 (L) 6.5 - 8.1 g/dL   Albumin 2.5 (L) 3.5 - 5.0 g/dL   AST 186 (H) 15 - 41 U/L   ALT 60 (H) 0 - 44 U/L   Alkaline Phosphatase 208 (H) 38 - 126 U/L   Total Bilirubin 6.3 (H) 0.3 - 1.2 mg/dL   GFR, Estimated >60 >60 mL/min    Comment: (NOTE) Calculated using the CKD-EPI Creatinine Equation (2021)    Anion gap 6 5 - 15    Comment: Performed at [facility], [place] [address], [place], [place] [address]  Hepatitis panel, acute     Status: None   Collection Time: 08/23/22  3:26 AM  Result Value Ref Range   Hepatitis B Surface Ag NON REACTIVE NON REACTIVE   HCV Ab NON REACTIVE NON REACTIVE    Comment: (NOTE) Nonreactive HCV antibody screen is consistent with no HCV infections,  unless recent infection is suspected or other evidence exists to indicate HCV infection.     Hep A IgM NON REACTIVE NON REACTIVE   Hep B C IgM NON REACTIVE NON REACTIVE    Comment: Performed at [facility], [place] [address] [place], [place] [address]  Ammonia     Status: Abnormal   Collection Time: 08/23/22  3:26 AM  Result Value Ref Range   Ammonia 53 (H) 9 - 35 umol/L    Comment: Performed at [facility], [place] [address], [place], [place] [address]  Comprehensive metabolic panel     Status: Abnormal   Collection Time: 08/23/22  3:26 AM  Result Value Ref Range   Sodium 130 (L) 135 - 145 mmol/L   Potassium 3.4 (L) 3.5 - 5.1 mmol/L   Chloride 91 (L) 98 - 111 mmol/L   CO2 32 22 - 32 mmol/L   Glucose, Bld 111 (H) 70 - 99 mg/dL    Comment: Glucose reference range applies only to samples taken after fasting for at least 8 hours.   BUN 10 6 - 20 mg/dL   Creatinine, Ser 0.59 (L) 0.61 - 1.24 mg/dL   Calcium 8.0 (L) 8.9 - 10.3 mg/dL    Total Protein 6.1 (L) 6.5 - 8.1 g/dL   Albumin 2.4 (L) 3.5 - 5.0 g/dL   AST 155 (H) 15 - 41 U/L   ALT 54 (H) 0 - 44 U/L   Alkaline Phosphatase 189 (H) 38 - 126 U/L   Total Bilirubin 4.3 (H) 0.3 - 1.2 mg/dL   GFR, Estimated >60 >60 mL/min    Comment: (NOTE) Calculated using the CKD-EPI Creatinine Equation (2021)    Anion gap 7 5 - 15    Comment: Performed at [facility], [place] [address], [place], [place] [address]  Protime-INR     Status: Abnormal   Collection Time: 08/23/22  3:26 AM  Result Value Ref Range   Prothrombin Time 16.0 (H) 11.4 - 15.2 seconds   INR 1.3 (H) 0.8 - 1.2    Comment: (NOTE) INR goal varies based on device and disease states. Performed at [facility], [place] [address] [place], [place] [address]   CBC     Status: Abnormal   Collection Time: 08/23/22  3:26 AM  Result Value Ref Range   WBC 8.4 4.0 - 10.5 K/uL   RBC 2.78 (L) 4.22 - 5.81 MIL/uL   Hemoglobin 11.1 (L) 13.0 - 17.0 g/dL  HCT 31.9 (L) 39.0 - 52.0 %   MCV 114.7 (H) 80.0 - 100.0 fL   MCH 39.9 (H) 26.0 - 34.0 pg   MCHC 34.8 30.0 - 36.0 g/dL   RDW 16.0 (H) 11.5 - 15.5 %   Platelets 113 (L) 150 - 400 K/uL    Comment: SPECIMEN CHECKED FOR CLOTS Immature Platelet Fraction may be clinically indicated, consider ordering this additional test [id number] REPEATED TO VERIFY    nRBC 0.0 0.0 - 0.2 %    Comment: Performed at [facility], [place] [address] [place], [place] [address]  Cytology - Non PAP;     Status: None   Collection Time: 08/23/22 11:52 AM  Result Value Ref Range   CYTOLOGY - NON GYN      CYTOLOGY - NON PAP CASE: [id number] PATIENT: Kass Kavanagh Non-Gynecological Cytology Report     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 ASCITES, PARACENTESIS:   FINAL MICROSCOPIC DIAGNOSIS: - No malignant cells identified - Numerous benign mesothelial cells with scattered lymphocytes within  a serous background  SPECIMEN ADEQUACY: Satisfactory for evaluation  GROSS: Received is/are 1000 ccs of yellow fluid. (CM:cm) Prepared: Smears:  0 Concentration Method (Thin Prep):  1 Cell Block:  1 Additional Studies:  n/a     Final Diagnosis performed by Tobin Chad, MD.   Electronically signed 08/25/2022 Technical component performed at [facility], [place] [address], [place], [place] [address].  Professional component performed at [employer]. [facility], [place] [address], [place], [place] [address].  Immunohistochemistry Technical component (if applicable) was performed at [facility]. [address] [place], [place], [place] [address].   IMMUNOHISTOCHEMISTRY DISCLAIMER (if applicable): Some of these immunohistochemical stains may have been developed and the performance characteristics determine by [facility]. Some may not have been cleared or approved by the U.S. Food and Drug Administration. The FDA has determined that such clearance or approval is not necessary. This test is used for clinical purposes. It should not be regarded as investigational or for research. This laboratory is certified under the [place] (CLIA-88) as qualified to perform high complexity clinical laboratory testing.  The controls stained appropriately.   Body fluid cell count with differential     Status: Abnormal   Collection Time: 08/23/22 11:52 AM  Result Value Ref Range   Fluid Type-FCT CYTO PERI    Color, Fluid YELLOW (A) YELLOW   Appearance, Fluid CLEAR CLEAR   Total Nucleated Cell Count, Fluid 173 0 - 1,000 cu mm   Neutrophil Count, Fluid 7 0 - 25 %   Lymphs, Fluid 7 %   Monocyte-Macrophage-Serous Fluid 86 50 - 90 %   Eos, Fluid 0 %    Comment: Performed at [facility], [place] [address], [place], [place] [address]  Albumin, pleural or peritoneal fluid      Status:  None   Collection Time: 08/23/22 11:52 AM  Result Value Ref Range   Albumin, Fluid <1.5 g/dL    Comment: (NOTE) No normal range established for this test Results should be evaluated in conjunction with serum values    Fluid Type-FALB CYTO PERI     Comment: Performed at [facility], [place] [address] [place],  [address]  CBC     Status: Abnormal   Collection Time: 08/24/22  4:17 AM  Result Value Ref Range   WBC 8.7 4.0 - 10.5  K/uL   RBC 3.09 (L) 4.22 - 5.81 MIL/uL   Hemoglobin 12.7 (L) 13.0 - 17.0 g/dL   HCT 36.3 (L) 39.0 - 52.0 %   MCV 117.5 (H) 80.0 - 100.0 fL   MCH 41.1 (H) 26.0 - 34.0 pg   MCHC 35.0 30.0 - 36.0 g/dL   RDW 16.9 (H) 11.5 - 15.5 %   Platelets 121 (L) 150 - 400 K/uL   nRBC 0.2 0.0 - 0.2 %    Comment: Performed at [facility], [place] [address], [place], [place] [address]  Comprehensive metabolic panel     Status: Abnormal   Collection Time: 08/24/22  4:17 AM  Result Value Ref Range   Sodium 131 (L) 135 - 145 mmol/L   Potassium 3.6 3.5 - 5.1 mmol/L   Chloride 94 (L) 98 - 111 mmol/L   CO2 31 22 - 32 mmol/L   Glucose, Bld 111 (H) 70 - 99 mg/dL    Comment: Glucose reference range applies only to samples taken after fasting for at least 8 hours.   BUN 9 6 - 20 mg/dL   Creatinine, Ser 0.67 0.61 - 1.24 mg/dL   Calcium 8.0 (L) 8.9 - 10.3 mg/dL   Total Protein 6.5 6.5 - 8.1 g/dL   Albumin 2.6 (L) 3.5 - 5.0 g/dL   AST 139 (H) 15 - 41 U/L   ALT 54 (H) 0 - 44 U/L   Alkaline Phosphatase 187 (H) 38 - 126 U/L   Total Bilirubin 4.5 (H) 0.3 - 1.2 mg/dL   GFR, Estimated >60 >60 mL/min    Comment: (NOTE) Calculated using the CKD-EPI Creatinine Equation (2021)    Anion gap 6 5 - 15    Comment: Performed at [facility], [place] [address] [place], [place] [address]  Protime-INR     Status: Abnormal   Collection Time: 08/24/22  4:17 AM  Result Value Ref Range   Prothrombin Time 15.6 (H) 11.4 - 15.2 seconds   INR  1.3 (H) 0.8 - 1.2    Comment: (NOTE) INR goal varies based on device and disease states. Performed at [facility], [place] [address], [place], [place] [address]         Signed: Loralee Pacas, MD 08/30/2022 7:17 PM

## 2022-09-02 ENCOUNTER — Other Ambulatory Visit (HOSPITAL_COMMUNITY): Payer: Self-pay | Admitting: Internal Medicine

## 2022-09-02 ENCOUNTER — Other Ambulatory Visit: Payer: Self-pay | Admitting: Internal Medicine

## 2022-09-02 DIAGNOSIS — K7031 Alcoholic cirrhosis of liver with ascites: Secondary | ICD-10-CM | POA: Diagnosis not present

## 2022-09-02 LAB — PROTIME-INR: INR: 1.1 (ref 0.80–1.20)

## 2022-09-02 LAB — COMPREHENSIVE METABOLIC PANEL: EGFR: 123

## 2022-09-03 ENCOUNTER — Ambulatory Visit (HOSPITAL_COMMUNITY)
Admission: RE | Admit: 2022-09-03 | Discharge: 2022-09-03 | Disposition: A | Discharge status: Home / Self Care | Payer: BC Managed Care – PPO | Source: Ambulatory Visit | Attending: Internal Medicine | Admitting: Internal Medicine

## 2022-09-03 DIAGNOSIS — K7031 Alcoholic cirrhosis of liver with ascites: Secondary | ICD-10-CM | POA: Diagnosis not present

## 2022-09-03 HISTORY — PX: IR PARACENTESIS: IMG2679

## 2022-09-03 LAB — BODY FLUID CELL COUNT WITH DIFFERENTIAL
Eos, Fluid: 0 %
Lymphs, Fluid: 32 %
Monocyte-Macrophage-Serous Fluid: 61 % (ref 50–90)
Neutrophil Count, Fluid: 7 % (ref 0–25)
Total Nucleated Cell Count, Fluid: 210 cu mm (ref 0–1000)

## 2022-09-03 LAB — ALBUMIN, PLEURAL OR PERITONEAL FLUID: Albumin, Fluid: <1.5 g/dL

## 2022-09-03 MED ORDER — LIDOCAINE HCL 1 % IJ SOLN
INTRAMUSCULAR | Status: AC
  Administered 2022-09-03: 10 mL via INTRADERMAL
  Filled 2022-09-03: qty 20

## 2022-09-03 NOTE — Procedures (Signed)
PROCEDURE SUMMARY:  Successful ultrasound guided paracentesis from the right lower quadrant.  Yielded 2.9 L of dark yellow fluid.  No immediate complications.  The patient tolerated the procedure well.   Specimen sent for labs.  EBL < 2 mL  The patient has required >/=2 paracenteses in a 30 day period and a screening evaluation by the [facility] has been arranged.  Soyla Dryer, [place] [phone] 09/03/2022, 2:52 PM

## 2022-09-06 ENCOUNTER — Ambulatory Visit (INDEPENDENT_AMBULATORY_CARE_PROVIDER_SITE_OTHER): Payer: BC Managed Care – PPO | Admitting: Internal Medicine

## 2022-09-06 ENCOUNTER — Encounter: Payer: Self-pay | Admitting: Internal Medicine

## 2022-09-06 VITALS — BP 110/76 | HR 78 | Temp 99.2°F | Ht 67.0 in | Wt 159.0 lb

## 2022-09-06 DIAGNOSIS — G2581 Restless legs syndrome: Secondary | ICD-10-CM | POA: Diagnosis not present

## 2022-09-06 DIAGNOSIS — T50905A Adverse effect of unspecified drugs, medicaments and biological substances, initial encounter: Secondary | ICD-10-CM

## 2022-09-06 DIAGNOSIS — R17 Unspecified jaundice: Secondary | ICD-10-CM

## 2022-09-06 DIAGNOSIS — E876 Hypokalemia: Secondary | ICD-10-CM

## 2022-09-06 DIAGNOSIS — E878 Other disorders of electrolyte and fluid balance, not elsewhere classified: Secondary | ICD-10-CM | POA: Insufficient documentation

## 2022-09-06 DIAGNOSIS — E663 Overweight: Secondary | ICD-10-CM

## 2022-09-06 DIAGNOSIS — F1090 Alcohol use, unspecified, uncomplicated: Secondary | ICD-10-CM | POA: Diagnosis not present

## 2022-09-06 DIAGNOSIS — E871 Hypo-osmolality and hyponatremia: Secondary | ICD-10-CM

## 2022-09-06 DIAGNOSIS — G312 Degeneration of nervous system due to alcohol: Secondary | ICD-10-CM

## 2022-09-06 DIAGNOSIS — E538 Deficiency of other specified B group vitamins: Secondary | ICD-10-CM

## 2022-09-06 DIAGNOSIS — D696 Thrombocytopenia, unspecified: Secondary | ICD-10-CM

## 2022-09-06 HISTORY — DX: Unspecified jaundice: R17

## 2022-09-06 MED ORDER — GABAPENTIN 400 MG PO CAPS: 400.0000 mg | ORAL_CAPSULE | Freq: Three times a day (TID) | ORAL | 5 refills | Status: DC

## 2022-09-06 MED ORDER — LACTULOSE 10 GM/15ML PO SOLN: 30.0000 g | Freq: Three times a day (TID) | ORAL | 5 refills | Status: DC

## 2022-09-06 NOTE — Assessment & Plan Note (Signed)
We discussed teetalling and why its important for him to continue absolute abstinence Due to him maintaining absolute abstinence naltrexone will likely not be effective and it was on backorder so were not using that He reports he is taking the gabapentin at a dose of 1 3 times a day He reports he is taking the acamprosate at a frequency and dose of 2 tablets 3 times daily Encouraged patient to to do 12 steps.

## 2022-09-06 NOTE — Progress Notes (Signed)
[facility]: [id number]   Routine Medical Office Visit  Patient:  John Maxwell      Age: 42 y.o.       Sex:  male  Date:   09/06/2022  PCP:    John Pacas, MD   [facility] Provider: Loralee Pacas, MD   Assessment and Plan:   John Maxwell was seen today for one week follow-up.  Elevated bilirubin Overview:    Latest Ref Rng & Units 08/24/2022    4:17 AM 08/23/2022    3:26 AM 08/22/2022    4:08 AM  Hepatic Function  Total Protein 6.5 - 8.1 g/dL 6.5  6.1  6.4   Albumin 3.5 - 5.0 g/dL 2.6  2.4  2.5   AST 15 - 41 U/L 139  155  186   ALT 0 - 44 U/L 54  54  60   Alk Phosphatase 38 - 126 U/L 187  189  208   Total Bilirubin 0.3 - 1.2 mg/dL 4.5  4.3  6.3      Orders: -     Bilirubin, fractionated(tot/dir/indir)  Alcohol use disorder Overview: In early remission Not a drip 3/4-3/11 2024 History severe with alcoholic hepatitis and acute liver failure that seems to be currently resolving.   Assessment & Plan: We discussed teetalling and why its important for him to continue absolute abstinence Due to him maintaining absolute abstinence naltrexone will likely not be effective and it was on backorder so were not using that He reports he is taking the gabapentin at a dose of 1 3 times a day He reports he is taking the acamprosate at a frequency and dose of 2 tablets 3 times daily Encouraged patient to to do 12 steps.   Electrolyte abnormality Overview: Lab Results  Component Value Date/Time   K 3.6 08/24/2022 04:17 AM   K 3.4 (L) 08/23/2022 03:26 AM   K 3.1 (L) 08/22/2022 04:08 AM   K 3.1 (L) 08/21/2022 02:32 PM   K 3.4 (L) 08/19/2022 03:16 PM   K 3.4 (L) 07/31/2022 07:22 PM   K 3.9 06/17/2017 08:22 AM   Lab Results  Component Value Date/Time   NA 131 (L) 08/24/2022 04:17 AM   NA 130 (L) 08/23/2022 03:26 AM   NA 126 (L) 08/22/2022 04:08 AM   NA 127 (L) 08/21/2022 02:32 PM   NA 134 (L) 08/19/2022 03:16 PM   NA 132 (L) 07/31/2022  07:22 PM   NA 139 06/17/2017 08:22 AM     Orders: -     Comprehensive metabolic panel -     Magnesium  Alcoholic encephalopathy (HCC) -     Lactulose; Take 45 mLs by mouth 3 times daily.  Dispense: 236 mL; Refill: 5  Thrombocytopenia Overview: Lab Results  Component Value Date/Time   PLT 121 (L) 08/24/2022 04:17 AM   PLT 113 (L) 08/23/2022 03:26 AM   PLT 105 (L) 08/22/2022 04:08 AM   PLT 123 (L) 08/21/2022 02:32 PM   PLT 150.0 08/19/2022 03:16 PM      Drug-induced hypokalemia Overview: Lab Results  Component Value Date/Time   K 3.6 08/24/2022 04:17 AM   K 3.4 (L) 08/23/2022 03:26 AM   K 3.1 (L) 08/22/2022 04:08 AM   K 3.1 (L) 08/21/2022 02:32 PM   K 3.4 (L) 08/19/2022 03:16 PM   K 3.4 (L) 07/31/2022 07:22 PM   K 3.9 06/17/2017 08:22 AM   Due to requiring lasix for ascites   Overweight Overview:  Appears ascitic / cirrhotic Body mass index is 24.9 kg/m.    Hyponatremia Overview: Lab Results  Component Value Date/Time   NA 131 (L) 08/24/2022 04:17 AM   NA 130 (L) 08/23/2022 03:26 AM   NA 126 (L) 08/22/2022 04:08 AM   NA 127 (L) 08/21/2022 02:32 PM   NA 134 (L) 08/19/2022 03:16 PM   NA 132 (L) 07/31/2022 07:22 PM   NA 139 06/17/2017 08:22 AM      Folate deficiency Overview: Lab Results  Component Value Date   FOLATE 1.6 (L) 08/21/2022  Supplements Was associated with alcohol use disorder uncontrolled    Restless legs Overview: Differential Diagnosis (DDx): Worsening RLS due to ropinirole (Requip) side effects or inadequate dosing. Secondary RLS related to chronic liver disease, electrolyte imbalances, or folate deficiency. Medication-induced exacerbation of RLS (e.g., furosemide-induced electrolyte imbalances). Iron deficiency    Assessment & Plan: Worsened by ropinirole, perhaps due to underdosing but seems wiser to modify gabapentin to correct   When ropinirole leads to worsening of RLS symptoms, gabapentin emerges as a viable  alternative, providing similar benefits in symptom relief and sleep improvement without significant side effects. While ropinirole is effective and generally well-tolerated, the potential for symptom augmentation with dopaminergic agents necessitates consideration of alternatives like gabapentin.  Orders: -     IBC + Ferritin -     Gabapentin; Take 1 capsule (400 mg total) by mouth 3 (three) times daily. Take by mouth: 1 tablet in the mornings, 1 tablet in mid-day, and 2 tablets at bedtime Note dose adjusted from prior  to help treat restless legs, discontinue ropinirole  Dispense: 120 capsule; Refill: 5     Clinical Presentation:   The patient is a 42 y.o. male: Active Ambulatory Problems    Diagnosis Date Noted   Alcohol use disorder 08/20/2022   Panic attack [id number]   Alcoholic cirrhosis of liver ([place]) 08/20/2022   Liver failure without hepatic coma ([place]) 08/20/2022   Hyponatremia 08/21/2022   Drug-induced hypokalemia 08/21/2022   Anemia 08/21/2022   Thrombocytopenia 08/22/2022   Folate deficiency 08/22/2022   Encephalopathy, hepatic ([place]) 08/23/2022   Portal hypertension ([place]) [id number]   Alcoholic hepatitis with ascites 08/30/2022   Restless legs 08/30/2022   Elevated bilirubin 09/06/2022   Electrolyte abnormality 09/06/2022   Resolved Ambulatory Problems    Diagnosis Date Noted   Liver mass, ruled out 08/20/2022   Overweight 08/20/2022   Delirium tremens ([place]) 08/20/2022   Alcohol withdrawal ([place]) 08/21/2022   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   folic acid (FOLVITE) 1 MG tablet Take 1 tablet (1 mg total) by mouth daily.   furosemide  (LASIX) 40 MG tablet Take 1 tablet (40 mg total) by mouth daily.   potassium chloride (KLOR-CON M) 10 MEQ tablet Take 1 tablet (10 mEq total) by mouth daily.   potassium chloride (MICRO-K) 10 MEQ CR capsule 1 capsule with food Orally Twice a day   rOPINIRole (REQUIP) 0.25 MG tablet Take 1 tablet (0.25 mg total) by mouth 3 (three) times daily.   spironolactone (ALDACTONE) 100 MG tablet Take 1 tablet (100 mg total) by mouth daily.  thiamine (VITAMIN B-1) 100 MG tablet Take 1 tablet (100 mg total) by mouth daily.   traZODone (DESYREL) 50 MG tablet Take 1 tablet (50 mg total) by mouth at bedtime.   [DISCONTINUED] gabapentin (NEURONTIN) 400 MG capsule Take 1 capsule (400 mg total) by mouth 3 (three) times daily.   [DISCONTINUED] lactulose (CHRONULAC) 10 GM/15ML solution Take 45 mLs by mouth 3 times daily.   No facility-administered medications prior to visit.     Chief Complaint  Patient presents with   One week follow-up    Needs refill of lactulose.    HPI   Reports no drinking at all since last week  Not taking naltrexone as planned Taking all other medications, except ropinirole made restless legs syndrome worse so stopped.        Clinical Data Analysis:  Physical Exam  BP 110/76 (BP Location: Left Arm, Patient Position: Sitting)   Pulse 78   Temp 99.2 F (37.3 C) (Temporal)   Ht '5\' 7"'$  (1.702 m)   Wt 159 lb (72.1 kg)   SpO2 99%   BMI 24.90 kg/m  Wt Readings from Last 10 Encounters:  09/06/22 159 lb (72.1 kg)  08/30/22 175 lb 12.8 oz (79.7 kg)  08/21/22 186 lb 3.2 oz (84.5 kg)  08/19/22 186 lb 3.2 oz (84.5 kg)  06/23/21 165 lb (74.8 kg)  05/22/18 180 lb (81.6 kg)  02/23/18 165 lb (74.8 kg)   Vital signs reviewed.  Nursing notes reviewed. Weight trend reviewed. Abnormalities noted: weight loss !!!  Appears mainly due to fluid loss off abdomen BMI is an unreliable indicator of healthy body composition due to its inability to reflect lean muscle mass.  General Appearance:   Well developed, well nourished male in no acute distress.   Pulmonary:  Normal work of breathing at rest, no respiratory distress apparent. SpO2: 99 %  Musculoskeletal: All extremities are intact.  Neurological:  Awake, alert. No obvious focal neurological deficits or cognitive impairments.  Sensorium seems unclouded. Psychiatric:  Appropriate mood, pleasant demeanor Problem-specific findings:  looks much better today, scleral icterus resolved   Results Reviewed: (reviewed labs/imaging may be also be found in the assessment / plan section):     No results found for any visits on 09/06/22.  Recent Results (from the past 2160 hour(s))  CBC with Differential     Status: Abnormal   Collection Time: 07/31/22  7:22 PM  Result Value Ref Range   WBC 8.3 4.0 - 10.5 K/uL   RBC 3.43 (L) 4.22 - 5.81 MIL/uL   Hemoglobin 13.3 13.0 - 17.0 g/dL   HCT 36.9 (L) 39.0 - 52.0 %   MCV 107.6 (H) 80.0 - 100.0 fL   MCH 38.8 (H) 26.0 - 34.0 pg   MCHC 36.0 30.0 - 36.0 g/dL   RDW 16.4 (H) 11.5 - 15.5 %   Platelets 128 (L) 150 - 400 K/uL   nRBC 0.0 0.0 - 0.2 %   Neutrophils Relative % 75 %   Neutro Abs 6.3 1.7 - 7.7 K/uL   Lymphocytes Relative 13 %   Lymphs Abs 1.1 0.7 - 4.0 K/uL   Monocytes Relative 10 %   Monocytes Absolute 0.8 0.1 - 1.0 K/uL   Eosinophils Relative 0 %   Eosinophils Absolute 0.0 0.0 - 0.5 K/uL   Basophils Relative 1 %   Basophils Absolute 0.1 0.0 - 0.1 K/uL   Immature Granulocytes 1 %   Abs Immature Granulocytes 0.05 0.00 - 0.07 K/uL    Comment:  Performed at [facility], [address] [place], [place] [address]  Comprehensive metabolic panel     Status: Abnormal   Collection Time: 07/31/22  7:22 PM  Result Value Ref Range   Sodium 132 (L) 135 - 145 mmol/L   Potassium 3.4 (L) 3.5 - 5.1 mmol/L   Chloride 95 (L) 98 - 111 mmol/L   CO2 28 22 - 32 mmol/L   Glucose, Bld 137 (H) 70 - 99 mg/dL    Comment: Glucose reference range applies only to samples taken after fasting  for at least 8 hours.   BUN <5 (L) 6 - 20 mg/dL    Comment: RESULTS VERIFIED BY REPEAT TESTING   Creatinine, Ser 0.59 (L) 0.61 - 1.24 mg/dL   Calcium 7.6 (L) 8.9 - 10.3 mg/dL   Total Protein 6.9 6.5 - 8.1 g/dL   Albumin 2.8 (L) 3.5 - 5.0 g/dL   AST 175 (H) 15 - 41 U/L   ALT 37 0 - 44 U/L   Alkaline Phosphatase 245 (H) 38 - 126 U/L   Total Bilirubin 2.6 (H) 0.3 - 1.2 mg/dL   GFR, Estimated >60 >60 mL/min    Comment: (NOTE) Calculated using the CKD-EPI Creatinine Equation (2021)    Anion gap 9 5 - 15    Comment: Performed at [facility], [address] [place], [place] [address]  Lipase, blood     Status: None   Collection Time: 07/31/22  7:22 PM  Result Value Ref Range   Lipase 23 11 - 51 U/L    Comment: Performed at [facility] [address] [place], [place] [address]  Urinalysis, Routine w reflex microscopic -Urine, Clean Catch     Status: Abnormal   Collection Time: 07/31/22  7:37 PM  Result Value Ref Range   Color, Urine AMBER (A) YELLOW    Comment: BIOCHEMICALS MAY BE AFFECTED BY COLOR   APPearance CLEAR CLEAR   Specific Gravity, Urine 1.020 1.005 - 1.030   pH 6.0 5.0 - 8.0   Glucose, UA NEGATIVE NEGATIVE mg/dL   Hgb urine dipstick NEGATIVE NEGATIVE   Bilirubin Urine MODERATE (A) NEGATIVE   Ketones, ur NEGATIVE NEGATIVE mg/dL   Protein, ur 30 (A) NEGATIVE mg/dL   Nitrite NEGATIVE NEGATIVE   Leukocytes,Ua NEGATIVE NEGATIVE    Comment: Performed at [facility], [address] [place], [place] [address]  Urinalysis, Microscopic (reflex)     Status: Abnormal   Collection Time: 07/31/22  7:37 PM  Result Value Ref Range   RBC / HPF 0-5 0 - 5 RBC/hpf   WBC, UA 0-5 0 - 5 WBC/hpf   Bacteria, UA MANY (A) NONE SEEN   Squamous Epithelial / HPF 0-5 0 - 5 /HPF    Comment: Performed at [facility], [address] [place], [place] [address]  AFP tumor marker     Status: None   Collection Time: 08/19/22 12:01 PM  Result Value  Ref Range   AFP-Tumor Marker 4.2 <6.1 ng/mL    Comment: . This test was performed using the Beckman Coulter chemiluminescent method. Values obtained from different assay methods cannot be used interchangeably. AFP levels, regardless of value, should not be interpreted as absolute evidence of the presence or absence of disease. .   Lipid panel     Status: Abnormal   Collection Time: 08/19/22 12:01 PM  Result Value Ref Range   Cholesterol 128 0 -  200 mg/dL    Comment: ATP III Classification       Desirable:  < 200 mg/dL               Borderline High:  200 - 239 mg/dL          High:  > = 240 mg/dL   Triglycerides 96.0 0.0 - 149.0 mg/dL    Comment: Normal:  <150 mg/dLBorderline High:  150 - 199 mg/dL   HDL 27.70 (L) >39.00 mg/dL   VLDL 19.2 0.0 - 40.0 mg/dL   LDL Cholesterol 81 0 - 99 mg/dL   Total CHOL/HDL Ratio 5     Comment:                Men          Women1/2 Average Risk     3.4          3.3Average Risk          5.0          4.42X Average Risk          9.6          7.13X Average Risk          15.0          11.0                       NonHDL 99.98     Comment: NOTE:  Non-HDL goal should be 30 mg/dL higher than patient's LDL goal (i.e. LDL goal of < 70 mg/dL, would have non-HDL goal of < 100 mg/dL)  TSH     Status: None   Collection Time: 08/19/22 12:01 PM  Result Value Ref Range   TSH 4.95 0.35 - 5.50 uIU/mL  HgB A1c     Status: Abnormal   Collection Time: 08/19/22 12:01 PM  Result Value Ref Range   Hgb A1c MFr Bld 4.3 (L) 4.6 - 6.5 %    Comment: Glycemic Control Guidelines for People with Diabetes:Non Diabetic:  <6%Goal of Therapy: <7%Additional Action Suggested:  >8%   Comprehensive metabolic panel     Status: Abnormal   Collection Time: 08/19/22  3:16 PM  Result Value Ref Range   Sodium 134 (L) 135 - 145 mEq/L   Potassium 3.4 (L) 3.5 - 5.1 mEq/L   Chloride 87 (L) 96 - 112 mEq/L   CO2 33 (H) 19 - 32 mEq/L   Glucose, Bld 95 70 - 99 mg/dL   BUN 6 6 - 23 mg/dL   Creatinine,  Ser 0.71 0.40 - 1.50 mg/dL   Total Bilirubin 3.6 (H) 0.2 - 1.2 mg/dL   Alkaline Phosphatase 295 (H) 39 - 117 U/L   AST 232 (H) 0 - 37 U/L   ALT 56 (H) 0 - 53 U/L   Total Protein 6.5 6.0 - 8.3 g/dL   Albumin 3.0 (L) 3.5 - 5.2 g/dL   GFR 115.78 >60.00 mL/min    Comment: Calculated using the CKD-EPI Creatinine Equation (2021)   Calcium 8.2 (L) 8.4 - 10.5 mg/dL  CBC     Status: Abnormal   Collection Time: 08/19/22  3:16 PM  Result Value Ref Range   WBC 9.3 4.0 - 10.5 K/uL   RBC 3.14 (L) 4.22 - 5.81 Mil/uL   Platelets 150.0 150.0 - 400.0 K/uL   Hemoglobin 12.7 (L) 13.0 - 17.0 g/dL   HCT 36.9 (L) 39.0 - 52.0 %   MCV 117.5 Repeated and verified  X2. (H) 78.0 - 100.0 fl   MCHC 34.5 30.0 - 36.0 g/dL   RDW 15.7 (H) 11.5 - 15.5 %  Osmolality, urine     Status: None   Collection Time: 08/21/22  5:09 AM  Result Value Ref Range   Osmolality, Ur 776 300 - 900 mOsm/kg    Comment: Performed at [facility] [address] [place], [place] [address]  Comprehensive metabolic panel     Status: Abnormal   Collection Time: 08/21/22  2:32 PM  Result Value Ref Range   Sodium 127 (L) 135 - 145 mmol/L   Potassium 3.1 (L) 3.5 - 5.1 mmol/L   Chloride 87 (L) 98 - 111 mmol/L   CO2 30 22 - 32 mmol/L   Glucose, Bld 100 (H) 70 - 99 mg/dL    Comment: Glucose reference range applies only to samples taken after fasting for at least 8 hours.   BUN 9 6 - 20 mg/dL   Creatinine, Ser 0.53 (L) 0.61 - 1.24 mg/dL   Calcium 7.9 (L) 8.9 - 10.3 mg/dL   Total Protein 6.7 6.5 - 8.1 g/dL   Albumin 2.5 (L) 3.5 - 5.0 g/dL   AST 257 (H) 15 - 41 U/L   ALT 73 (H) 0 - 44 U/L   Alkaline Phosphatase 232 (H) 38 - 126 U/L   Total Bilirubin 7.5 (H) 0.3 - 1.2 mg/dL   GFR, Estimated >60 >60 mL/min    Comment: (NOTE) Calculated using the CKD-EPI Creatinine Equation (2021)    Anion gap 10 5 - 15    Comment: Performed at [facility], [address] [place], [place] [address]  Ethanol     Status: None    Collection Time: 08/21/22  2:32 PM  Result Value Ref Range   Alcohol, Ethyl (B) <10 <10 mg/dL    Comment: (NOTE) Lowest detectable limit for serum alcohol is 10 mg/dL.  For medical purposes only. Performed at [facility], [address] [place], [place] [address]   cbc     Status: Abnormal   Collection Time: 08/21/22  2:32 PM  Result Value Ref Range   WBC 13.3 (H) 4.0 - 10.5 K/uL   RBC 3.00 (L) 4.22 - 5.81 MIL/uL   Hemoglobin 12.0 (L) 13.0 - 17.0 g/dL   HCT 32.5 (L) 39.0 - 52.0 %   MCV 108.3 (H) 80.0 - 100.0 fL   MCH 40.0 (H) 26.0 - 34.0 pg   MCHC 36.9 (H) 30.0 - 36.0 g/dL   RDW 15.3 11.5 - 15.5 %   Platelets 123 (L) 150 - 400 K/uL    Comment: SPECIMEN CHECKED FOR CLOTS REPEATED TO VERIFY    nRBC 0.0 0.0 - 0.2 %    Comment: Performed at [facility], [address] [place], [place] [address]  Lipase, blood     Status: None   Collection Time: 08/21/22  2:32 PM  Result Value Ref Range   Lipase 39 11 - 51 U/L    Comment: Performed at [facility], [address] [place], [place] [address]  Protime-INR     Status: Abnormal   Collection Time: 08/21/22  3:41 PM  Result Value Ref Range   Prothrombin Time 16.1 (H) 11.4 - 15.2 seconds   INR 1.3 (H) 0.8 - 1.2    Comment: (NOTE) INR goal varies based on device and disease states. Performed at [facility], [address] [place], [place] [address]   Ammonia     Status: None   Collection Time: 08/21/22  3:41 PM  Result Value Ref Range   Ammonia 28 9 - 35 umol/L    Comment: Performed at [facility], [address] [place], [place] [address]  Rapid urine drug screen (hospital performed)     Status: None   Collection Time: 08/21/22  6:26 PM  Result Value Ref Range   Opiates NONE DETECTED NONE DETECTED   Cocaine NONE DETECTED NONE DETECTED   Benzodiazepines NONE DETECTED NONE DETECTED   Amphetamines NONE DETECTED NONE DETECTED   Tetrahydrocannabinol NONE DETECTED NONE  DETECTED   Barbiturates NONE DETECTED NONE DETECTED    Comment: (NOTE) DRUG SCREEN FOR MEDICAL PURPOSES ONLY.  IF CONFIRMATION IS NEEDED FOR ANY PURPOSE, NOTIFY LAB WITHIN 5 DAYS.  LOWEST DETECTABLE LIMITS FOR URINE DRUG SCREEN Drug Class                     Cutoff (ng/mL) Amphetamine and metabolites    1000 Barbiturate and metabolites    200 Benzodiazepine                 200 Opiates and metabolites        300 Cocaine and metabolites        300 THC                            50 Performed at [facility], [address] [place], [place] [address]   Sodium, urine, random     Status: None   Collection Time: 08/21/22  6:26 PM  Result Value Ref Range   Sodium, Ur <10 mmol/L    Comment: Performed at [facility] [address] [place], [place] [address]  Chloride, urine, random     Status: None   Collection Time: 08/21/22  6:26 PM  Result Value Ref Range   Chloride Urine 19 mmol/L    Comment: Performed at [facility] [address], [place], [place] [address]  HIV Antibody (routine testing w rflx)     Status: None   Collection Time: 08/21/22 10:12 PM  Result Value Ref Range   HIV Screen 4th Generation wRfx Non Reactive Non Reactive    Comment: Performed at [facility], [place] [address], [place], [place] [address]  Osmolality     Status: None   Collection Time: 08/21/22 10:12 PM  Result Value Ref Range   Osmolality 283 275 - 295 mOsm/kg    Comment: PERFORMED AT [facility] Performed at [facility], [place] [address] [place], [place] [address]   Vitamin B12     Status: None   Collection Time: 08/21/22 10:12 PM  Result Value Ref Range   Vitamin B-12 452 180 - 914 pg/mL    Comment: (NOTE) This assay is not validated for testing neonatal or myeloproliferative syndrome specimens for Vitamin B12 levels. Performed at [facility], [place] [address], [place], [place] [address]   Folate     Status: Abnormal    Collection Time: 08/21/22 10:12 PM  Result Value Ref Range   Folate 1.6 (L) >5.9 ng/mL    Comment: Performed at [facility], [place] [address], [place], [place] [address]  CBC     Status: Abnormal   Collection Time: 08/22/22  4:08 AM  Result  Value Ref Range   WBC 9.4 4.0 - 10.5 K/uL   RBC 2.95 (L) 4.22 - 5.81 MIL/uL   Hemoglobin 11.6 (L) 13.0 - 17.0 g/dL   HCT 32.9 (L) 39.0 - 52.0 %   MCV 111.5 (H) 80.0 - 100.0 fL   MCH 39.3 (H) 26.0 - 34.0 pg   MCHC 35.3 30.0 - 36.0 g/dL   RDW 15.9 (H) 11.5 - 15.5 %   Platelets 105 (L) 150 - 400 K/uL   nRBC 0.2 0.0 - 0.2 %    Comment: Performed at [facility], [place] [address], [place], [place] [address]  Comprehensive metabolic panel     Status: Abnormal   Collection Time: 08/22/22  4:08 AM  Result Value Ref Range   Sodium 126 (L) 135 - 145 mmol/L   Potassium 3.1 (L) 3.5 - 5.1 mmol/L   Chloride 89 (L) 98 - 111 mmol/L   CO2 31 22 - 32 mmol/L   Glucose, Bld 104 (H) 70 - 99 mg/dL    Comment: Glucose reference range applies only to samples taken after fasting for at least 8 hours.   BUN 12 6 - 20 mg/dL   Creatinine, Ser 0.61 0.61 - 1.24 mg/dL   Calcium 7.7 (L) 8.9 - 10.3 mg/dL   Total Protein 6.4 (L) 6.5 - 8.1 g/dL   Albumin 2.5 (L) 3.5 - 5.0 g/dL   AST 186 (H) 15 - 41 U/L   ALT 60 (H) 0 - 44 U/L   Alkaline Phosphatase 208 (H) 38 - 126 U/L   Total Bilirubin 6.3 (H) 0.3 - 1.2 mg/dL   GFR, Estimated >60 >60 mL/min    Comment: (NOTE) Calculated using the CKD-EPI Creatinine Equation (2021)    Anion gap 6 5 - 15    Comment: Performed at [facility], [place] [address], [place], [place] [address]  Hepatitis panel, acute     Status: None   Collection Time: 08/23/22  3:26 AM  Result Value Ref Range   Hepatitis B Surface Ag NON REACTIVE NON REACTIVE   HCV Ab NON REACTIVE NON REACTIVE    Comment: (NOTE) Nonreactive HCV antibody screen is consistent with no HCV infections,  unless recent infection  is suspected or other evidence exists to indicate HCV infection.     Hep A IgM NON REACTIVE NON REACTIVE   Hep B C IgM NON REACTIVE NON REACTIVE    Comment: Performed at [facility], [place] [address], [place], [place] [address]  Ammonia     Status: Abnormal   Collection Time: 08/23/22  3:26 AM  Result Value Ref Range   Ammonia 53 (H) 9 - 35 umol/L    Comment: Performed at [facility], [place] [address], [place], [place] [address]  Comprehensive metabolic panel     Status: Abnormal   Collection Time: 08/23/22  3:26 AM  Result Value Ref Range   Sodium 130 (L) 135 - 145 mmol/L   Potassium 3.4 (L) 3.5 - 5.1 mmol/L   Chloride 91 (L) 98 - 111 mmol/L   CO2 32 22 - 32 mmol/L   Glucose, Bld 111 (H) 70 - 99 mg/dL    Comment: Glucose reference range applies only to samples taken after fasting for at least 8 hours.   BUN 10 6 - 20 mg/dL   Creatinine, Ser 0.59 (L) 0.61 - 1.24 mg/dL   Calcium 8.0 (L) 8.9 - 10.3 mg/dL   Total Protein 6.1 (L) 6.5 -  8.1 g/dL   Albumin 2.4 (L) 3.5 - 5.0 g/dL   AST 155 (H) 15 - 41 U/L   ALT 54 (H) 0 - 44 U/L   Alkaline Phosphatase 189 (H) 38 - 126 U/L   Total Bilirubin 4.3 (H) 0.3 - 1.2 mg/dL   GFR, Estimated >60 >60 mL/min    Comment: (NOTE) Calculated using the CKD-EPI Creatinine Equation (2021)    Anion gap 7 5 - 15    Comment: Performed at [facility], [place] [address] [place], [place] [address]  Protime-INR     Status: Abnormal   Collection Time: 08/23/22  3:26 AM  Result Value Ref Range   Prothrombin Time 16.0 (H) 11.4 - 15.2 seconds   INR 1.3 (H) 0.8 - 1.2    Comment: (NOTE) INR goal varies based on device and disease states. Performed at [facility], [place] [address] [place], [place] [address]   CBC     Status: Abnormal   Collection Time: 08/23/22  3:26 AM  Result Value Ref Range   WBC 8.4 4.0 - 10.5 K/uL   RBC 2.78 (L) 4.22 - 5.81 MIL/uL   Hemoglobin 11.1 (L) 13.0 - 17.0 g/dL   HCT  31.9 (L) 39.0 - 52.0 %   MCV 114.7 (H) 80.0 - 100.0 fL   MCH 39.9 (H) 26.0 - 34.0 pg   MCHC 34.8 30.0 - 36.0 g/dL   RDW 16.0 (H) 11.5 - 15.5 %   Platelets 113 (L) 150 - 400 K/uL    Comment: SPECIMEN CHECKED FOR CLOTS Immature Platelet Fraction may be clinically indicated, consider ordering this additional test [id number] REPEATED TO VERIFY    nRBC 0.0 0.0 - 0.2 %    Comment: Performed at [facility], [place] [address] [place], [place] [address]  Cytology - Non PAP;     Status: None   Collection Time: 08/23/22 11:52 AM  Result Value Ref Range   CYTOLOGY - NON GYN      CYTOLOGY - NON PAP CASE: [id number] PATIENT: Markise Plotkin Non-Gynecological Cytology Report     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 ASCITES, PARACENTESIS:   FINAL MICROSCOPIC DIAGNOSIS: - No malignant cells identified - Numerous benign mesothelial cells with scattered lymphocytes within a serous background  SPECIMEN ADEQUACY: Satisfactory for evaluation  GROSS: Received is/are 1000 ccs of yellow fluid. (CM:cm) Prepared: Smears:  0 Concentration Method (Thin Prep):  1 Cell Block:  1 Additional Studies:  n/a     Final Diagnosis performed by Tobin Chad, MD.   Electronically signed 08/25/2022 Technical component performed at [facility], [place] [address] [place], [place] [address].  Professional component performed at [employer], [place] [address] [place], [place] [address].  Immunohistochemistry Technical component (if applicable) was performed at [facility]. [address], [place], [place], [place] [address].   IMMUNOHISTOCHEMISTRY DISCLAIMER (if applicable): Some of these immunohistochemical stains may have been developed and the performance characteristics determine by [facility]. Some may not have been cleared or approved by the U.S. Food and  Drug Administration. The FDA has determined that such clearance or approval is not necessary. This test is used for clinical purposes. It should not be regarded as investigational or for research. This laboratory is certified under the [place] (CLIA-88) as qualified to perform high complexity clinical laboratory testing.  The controls stained appropriately.  Body fluid cell count with differential     Status: Abnormal   Collection Time: 08/23/22 11:52 AM  Result Value Ref Range   Fluid Type-FCT CYTO PERI    Color, Fluid YELLOW (A) YELLOW   Appearance, Fluid CLEAR CLEAR   Total Nucleated Cell Count, Fluid 173 0 - 1,000 cu mm   Neutrophil Count, Fluid 7 0 - 25 %   Lymphs, Fluid 7 %   Monocyte-Macrophage-Serous Fluid 86 50 - 90 %   Eos, Fluid 0 %    Comment: Performed at [facility], [place] [address], [place], [place] [address]  Albumin, pleural or peritoneal fluid      Status: None   Collection Time: 08/23/22 11:52 AM  Result Value Ref Range   Albumin, Fluid <1.5 g/dL    Comment: (NOTE) No normal range established for this test Results should be evaluated in conjunction with serum values    Fluid Type-FALB CYTO PERI     Comment: Performed at [facility], [place] [address] [place], [place] [address]  CBC     Status: Abnormal   Collection Time: 08/24/22  4:17 AM  Result Value Ref Range   WBC 8.7 4.0 - 10.5 K/uL   RBC 3.09 (L) 4.22 - 5.81 MIL/uL   Hemoglobin 12.7 (L) 13.0 - 17.0 g/dL   HCT 36.3 (L) 39.0 - 52.0 %   MCV 117.5 (H) 80.0 - 100.0 fL   MCH 41.1 (H) 26.0 - 34.0 pg   MCHC 35.0 30.0 - 36.0 g/dL   RDW 16.9 (H) 11.5 - 15.5 %   Platelets 121 (L) 150 - 400 K/uL   nRBC 0.2 0.0 - 0.2 %    Comment: Performed at [facility], [place] [address], [place], [place] [address]  Comprehensive metabolic panel     Status: Abnormal   Collection Time: 08/24/22  4:17 AM  Result Value Ref Range    Sodium 131 (L) 135 - 145 mmol/L   Potassium 3.6 3.5 - 5.1 mmol/L   Chloride 94 (L) 98 - 111 mmol/L   CO2 31 22 - 32 mmol/L   Glucose, Bld 111 (H) 70 - 99 mg/dL    Comment: Glucose reference range applies only to samples taken after fasting for at least 8 hours.   BUN 9 6 - 20 mg/dL   Creatinine, Ser 0.67 0.61 - 1.24 mg/dL   Calcium 8.0 (L) 8.9 - 10.3 mg/dL   Total Protein 6.5 6.5 - 8.1 g/dL   Albumin 2.6 (L) 3.5 - 5.0 g/dL   AST 139 (H) 15 - 41 U/L   ALT 54 (H) 0 - 44 U/L   Alkaline Phosphatase 187 (H) 38 - 126 U/L   Total Bilirubin 4.5 (H) 0.3 - 1.2 mg/dL   GFR, Estimated >60 >60 mL/min    Comment: (NOTE) Calculated using the CKD-EPI Creatinine Equation (2021)    Anion gap 6 5 - 15    Comment: Performed at [facility], [place] [address] [place], [place] [address]  Protime-INR     Status: Abnormal   Collection Time: 08/24/22  4:17 AM  Result Value Ref Range   Prothrombin Time 15.6 (H) 11.4 - 15.2 seconds   INR 1.3 (H) 0.8 - 1.2    Comment: (NOTE) INR goal varies based on device and disease states. Performed at [facility], [place] [address] [place],  [address]   Body fluid cell count with differential     Status:  Abnormal   Collection Time: 09/03/22  1:57 PM  Result Value Ref Range   Fluid Type-FCT PERITONEAL     Comment: CORRECTED ON 03/08 AT 1529: PREVIOUSLY REPORTED AS ABDOMEN   Color, Fluid YELLOW YELLOW   Appearance, Fluid HAZY (A) CLEAR   Total Nucleated Cell Count, Fluid 210 0 - 1,000 cu mm   Neutrophil Count, Fluid 7 0 - 25 %   Lymphs, Fluid 32 %   Monocyte-Macrophage-Serous Fluid 61 50 - 90 %   Eos, Fluid 0 %   Other Cells, Fluid OTHER CELLS IDENTIFIED AS MESOTHELIAL CELLS %    Comment: Performed at [facility], [place] [address] [place], [place] [address]  Albumin, pleural or peritoneal fluid      Status: None   Collection Time: 09/03/22  1:57 PM  Result Value Ref Range   Albumin, Fluid <1.5 g/dL    Comment:  (NOTE) No normal range established for this test Results should be evaluated in conjunction with serum values    Fluid Type-FALB PERITONEAL     Comment: Performed at [facility] [address], [place], [place] [address] CORRECTED ON 03/08 AT 1529: PREVIOUSLY REPORTED AS ABDOMEN     No image results found.        Signed: Loralee Pacas, MD 09/06/2022 6:01 PM

## 2022-09-06 NOTE — Assessment & Plan Note (Signed)
Worsened by ropinirole, perhaps due to underdosing but seems wiser to modify gabapentin to correct   When ropinirole leads to worsening of RLS symptoms, gabapentin emerges as a viable alternative, providing similar benefits in symptom relief and sleep improvement without significant side effects. While ropinirole is effective and generally well-tolerated, the potential for symptom augmentation with dopaminergic agents necessitates consideration of alternatives like gabapentin.

## 2022-09-07 LAB — PATHOLOGIST SMEAR REVIEW

## 2022-09-15 ENCOUNTER — Telehealth: Payer: Self-pay | Admitting: Internal Medicine

## 2022-09-15 NOTE — Telephone Encounter (Signed)
Type of form received: Disability Support Form  Additional comments:   Received by: Adonis Brook  Form should be Faxed to: [phone]  Form should be mailed to:    Is patient requesting call for pickup:   Form placed:  In provider's box  Attach charge sheet. yes  Individual made aware of 3-5 business day turn around (Y/N)?  No

## 2022-09-16 NOTE — Telephone Encounter (Signed)
Received these form, spoke with patient and scheduled a VV appointment to complete this form for 3/25.

## 2022-09-20 ENCOUNTER — Ambulatory Visit: Payer: BC Managed Care – PPO | Admitting: Internal Medicine

## 2022-09-20 DIAGNOSIS — Z5320 Procedure and treatment not carried out because of patient's decision for unspecified reasons: Secondary | ICD-10-CM

## 2022-09-20 NOTE — Progress Notes (Signed)
Patient left without being seen after checking in.  From what I was told, there may have been miscommunication regarding whether this was to be in office or not.  It was rescheduled as video but we called repeatedly without answer and his voicemail was not setup to leave a voice message.

## 2022-10-07 DIAGNOSIS — K7031 Alcoholic cirrhosis of liver with ascites: Secondary | ICD-10-CM | POA: Diagnosis not present

## 2022-11-02 ENCOUNTER — Ambulatory Visit (INDEPENDENT_AMBULATORY_CARE_PROVIDER_SITE_OTHER): Payer: BC Managed Care – PPO | Admitting: Internal Medicine

## 2022-11-02 ENCOUNTER — Encounter: Payer: Self-pay | Admitting: Internal Medicine

## 2022-11-02 VITALS — BP 124/68 | HR 95 | Temp 97.8°F | Ht 67.0 in | Wt 163.8 lb

## 2022-11-02 DIAGNOSIS — N529 Male erectile dysfunction, unspecified: Secondary | ICD-10-CM | POA: Diagnosis not present

## 2022-11-02 DIAGNOSIS — G4700 Insomnia, unspecified: Secondary | ICD-10-CM | POA: Insufficient documentation

## 2022-11-02 DIAGNOSIS — M1990 Unspecified osteoarthritis, unspecified site: Secondary | ICD-10-CM | POA: Diagnosis not present

## 2022-11-02 DIAGNOSIS — E871 Hypo-osmolality and hyponatremia: Secondary | ICD-10-CM

## 2022-11-02 DIAGNOSIS — R7989 Other specified abnormal findings of blood chemistry: Secondary | ICD-10-CM

## 2022-11-02 DIAGNOSIS — R17 Unspecified jaundice: Secondary | ICD-10-CM

## 2022-11-02 DIAGNOSIS — G2581 Restless legs syndrome: Secondary | ICD-10-CM | POA: Diagnosis not present

## 2022-11-02 DIAGNOSIS — R5383 Other fatigue: Secondary | ICD-10-CM | POA: Insufficient documentation

## 2022-11-02 DIAGNOSIS — E538 Deficiency of other specified B group vitamins: Secondary | ICD-10-CM

## 2022-11-02 DIAGNOSIS — D696 Thrombocytopenia, unspecified: Secondary | ICD-10-CM

## 2022-11-02 DIAGNOSIS — R768 Other specified abnormal immunological findings in serum: Secondary | ICD-10-CM

## 2022-11-02 DIAGNOSIS — M064 Inflammatory polyarthropathy: Secondary | ICD-10-CM | POA: Insufficient documentation

## 2022-11-02 DIAGNOSIS — F1090 Alcohol use, unspecified, uncomplicated: Secondary | ICD-10-CM | POA: Diagnosis not present

## 2022-11-02 DIAGNOSIS — D538 Other specified nutritional anemias: Secondary | ICD-10-CM

## 2022-11-02 DIAGNOSIS — K7200 Acute and subacute hepatic failure without coma: Secondary | ICD-10-CM

## 2022-11-02 DIAGNOSIS — R21 Rash and other nonspecific skin eruption: Secondary | ICD-10-CM

## 2022-11-02 DIAGNOSIS — E878 Other disorders of electrolyte and fluid balance, not elsewhere classified: Secondary | ICD-10-CM

## 2022-11-02 HISTORY — DX: Unspecified osteoarthritis, unspecified site: M19.90

## 2022-11-02 LAB — TESTOSTERONE: Testosterone: 362.2 ng/dL (ref 300.00–890.00)

## 2022-11-02 LAB — VITAMIN D 25 HYDROXY (VIT D DEFICIENCY, FRACTURES): VITD: 12.47 ng/mL — ABNORMAL LOW (ref 30.00–100.00)

## 2022-11-02 LAB — CBC WITH DIFFERENTIAL/PLATELET
Basophils Absolute: 0 10*3/uL (ref 0.0–0.1)
Basophils Relative: 0.8 % (ref 0.0–3.0)
Eosinophils Absolute: 0.1 10*3/uL (ref 0.0–0.7)
Eosinophils Relative: 1.9 % (ref 0.0–5.0)
HCT: 37.4 % — ABNORMAL LOW (ref 39.0–52.0)
Hemoglobin: 12.3 g/dL — ABNORMAL LOW (ref 13.0–17.0)
Lymphocytes Relative: 36.5 % (ref 12.0–46.0)
Lymphs Abs: 1.9 10*3/uL (ref 0.7–4.0)
MCHC: 33 g/dL (ref 30.0–36.0)
MCV: 95.5 fl (ref 78.0–100.0)
Monocytes Absolute: 0.7 10*3/uL (ref 0.1–1.0)
Monocytes Relative: 14.3 % — ABNORMAL HIGH (ref 3.0–12.0)
Neutro Abs: 2.4 10*3/uL (ref 1.4–7.7)
Neutrophils Relative %: 46.5 % (ref 43.0–77.0)
Platelets: 221 10*3/uL (ref 150.0–400.0)
RBC: 3.91 Mil/uL — ABNORMAL LOW (ref 4.22–5.81)
RDW: 15.5 % (ref 11.5–15.5)
WBC: 5.2 10*3/uL (ref 4.0–10.5)

## 2022-11-02 LAB — COMPREHENSIVE METABOLIC PANEL
ALT: 20 U/L (ref 0–53)
AST: 37 U/L (ref 0–37)
Albumin: 3.6 g/dL (ref 3.5–5.2)
Alkaline Phosphatase: 105 U/L (ref 39–117)
BUN: 10 mg/dL (ref 6–23)
CO2: 28 mEq/L (ref 19–32)
Calcium: 9.3 mg/dL (ref 8.4–10.5)
Chloride: 101 mEq/L (ref 96–112)
Creatinine, Ser: 0.61 mg/dL (ref 0.40–1.50)
GFR: 121.04 mL/min (ref >60.00)
Glucose, Bld: 97 mg/dL (ref 70–99)
Potassium: 4.1 mEq/L (ref 3.5–5.1)
Sodium: 137 mEq/L (ref 135–145)
Total Bilirubin: 0.9 mg/dL (ref 0.2–1.2)
Total Protein: 6.8 g/dL (ref 6.0–8.3)

## 2022-11-02 LAB — CK: Total CK: 119 U/L (ref 7–232)

## 2022-11-02 LAB — C-REACTIVE PROTEIN: CRP: <1 mg/dL (ref 0.5–20.0)

## 2022-11-02 LAB — SEDIMENTATION RATE: Sed Rate: 29 mm/hr — ABNORMAL HIGH (ref 0–15)

## 2022-11-02 MED ORDER — ACAMPROSATE CALCIUM 333 MG PO TBEC: 666.0000 mg | DELAYED_RELEASE_TABLET | Freq: Three times a day (TID) | ORAL | 3 refills | Status: DC

## 2022-11-02 MED ORDER — TRAZODONE HCL 50 MG PO TABS: 50.0000 mg | ORAL_TABLET | Freq: Every day | ORAL | 3 refills | Status: DC

## 2022-11-02 MED ORDER — SILDENAFIL CITRATE 100 MG PO TABS: 50.0000 mg | ORAL_TABLET | Freq: Every day | ORAL | 11 refills | Status: DC | PRN

## 2022-11-02 MED ORDER — GABAPENTIN 400 MG PO CAPS: 400.0000 mg | ORAL_CAPSULE | Freq: Three times a day (TID) | ORAL | 1 refills | Status: DC

## 2022-11-02 NOTE — Assessment & Plan Note (Signed)
Stop spironolactone Check labs as recommended by artificial intelligence     Through a comprehensive analysis of anonymized patient data, we informed an AI-generated report. All AI suggestions underwent careful evaluation against established guidelines to ensure optimal patient care. During our collaborative review of the report's findings, we discussed potential diagnoses, recommended tests, treatment options, and next steps. I explained the rationale behind the AI recommendations, integrating my clinical judgment for the patient's understanding.  In collaboration with the patient, we explored the potential benefits and risks associated with certain categories of AI-suggested interventions. For example, we discussed the advantages and considerations of more aggressive treatment options. Ultimately, the patient elected to closely monitor the situation based on their preferences and our shared understanding of their condition. Patient's best interests and evidence-based practices guided all decisions, respecting their autonomy. Moving forward, we will continue close monitoring and revisit these options at the next visit or sooner if their condition changes. 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 The constellation of symptoms, including polyarticular joint pain, systemic symptoms, and recent severe liver disease, suggests a complex interplay of alcohol-related liver disease complications and possible alcohol-related musculoskeletal manifestations. The differential diagnosis for his joint symptoms includes alcoholic myopathy, secondary hypogonadism (contributing to his low libido and erectile  dysfunction), and potentially an inflammatory arthritis exacerbated by liver disease. The patient's history of hepatic encephalopathy, portal hypertension, and other complications of liver disease further complicates the clinical picture, necessitating a comprehensive approach to management.  Differential Diagnosis (DDx): - Alcoholic Myopathy - Secondary Hypogonadism - Inflammatory Arthritis - Osteoarthritis (considering the chronicity of symptoms) - Hemochromatosis (given liver disease and joint involvement, though less likely without a history of iron overload)  Plan:  Alcohol Use Disorder - Continue acamprosate as part of relapse prevention strategy. - Encourage engagement with behavioral health support, considering the patient's cravings and history of severe alcohol use disorder.  Joint Pain and Weakness - Dx:   - Rheumatologic panel including RF, anti-CCP, ANA, and ESR/CRP to evaluate for inflammatory arthritis.   - Serum creatine kinase (CK) to assess for myopathy.   - MRI of affected joints if indicated by severity of symptoms or lack of clarity from initial workup. - Tx:   - Optimize pain management, considering potential liver toxicity; avoid NSAIDs given liver disease. Consider acetaminophen at safe doses or tramadol for pain control.   - Physical therapy referral for joint stiffness and pain management.   - Vitamin D and calcium supplementation if not contraindicated, given potential for osteopenia/osteoporosis in chronic liver disease.  Fatigue, Low Libido, and Erectile Dysfunction - Dx:   - Total and free testosterone levels to evaluate for hypogonadism.   - Thyroid function tests, considering elevated TSH. - Tx:   - Address potential secondary hypogonadism based on endocrinology consultation.   - Consider phosphodiesterase type 5 inhibitors for erectile dysfunction after evaluating cardiovascular risk.   - Management of fatigue will be targeted based on underlying  cause; ensure adequate nutrition and address potential metabolic deficiencies.  Liver Disease Management - Dx:   - Continue monitoring liver function tests, CBC, and coagulation profile.   - Assess need for updated imaging (ultrasound with Doppler) to evaluate liver architecture and portal hypertension status. - Tx:   -  Lactulose and rifaximin continuation for prevention of hepatic encephalopathy recurrence.   - Nutritional consultation to ensure adequate protein intake and vitamin supplementation, particularly B vitamins.   - Monitor and manage complications of cirrhosis, including portal hypertension and ascites, with diuretics adjustment as needed.  Monitoring and Follow-Up - Regular follow-up with hepatology for liver disease management. - Rheumatology referral for joint symptoms workup and management. - Endocrinology consultation for management of potential hypogonadism. - Close monitoring of mental health and support for alcohol use disorder recovery.  This comprehensive plan addresses the multifaceted aspects of the patient's presentation, aiming to optimize his quality of life and manage the complications associated with his alcohol use disorder and liver disease.

## 2022-11-02 NOTE — Progress Notes (Addendum)
[facility]: [phone]   Routine Medical Office Visit  Patient:  John Maxwell      Age: 42 y.o.       Sex:  male  Date:   11/02/2022 PCP:    John Olszewski, MD   Today's Healthcare Provider: Lula Olszewski, MD       Assessment and Plan:   Note: many medical problems have resolved due to sobriety-  extensive problem list gardening today because many medical issues have resolved   Alcohol use disorder Assessment & Plan: Well-controlled now sober over 2 months - encouraged him to start track days using I am sober app. He installed it at my request. Encouraged to maintain abstinence  Encouraged patient to start a 12 step- he is religious- strongly encouraged alcohol anon   Following a detailed discussion about antabuse and its potential benefits and risks, the patient expressed a preference to monitor the situation for now.  He acknowledged the potential for serious health consequences of delaying this intervention.  In line with his preferences, we will continue to monitor the situation and discuss again as at the next visit or sooner if his condition changes.  I encouraged him to keep me updated on any developments.  He agreed to take some info in AVS.  He prefers to stay on gabapentin/acamprosate current dose   Orders: -     Acamprosate Calcium; Take 2 tablets (666 mg total) by mouth 3 (three) times daily with meals.  Dispense: 270 tablet; Refill: 3 -     Gabapentin; Take 1 capsule (400 mg total) by mouth 3 (three) times daily. Take by mouth: 1 tablet in the mornings, 1 tablet in mid-day, and 2 tablets at bedtime Note dose adjusted from prior  to help treat restless legs, discontinue ropinirole  Dispense: 270 capsule; Refill: 1  Restless legs -     Gabapentin; Take 1 capsule (400 mg total) by mouth 3 (three) times daily. Take by mouth: 1 tablet in the mornings, 1 tablet in mid-day, and 2 tablets at bedtime Note dose adjusted from prior  to help treat  restless legs, discontinue ropinirole  Dispense: 270 capsule; Refill: 1  Other fatigue -     VITAMIN D 25 Hydroxy (Vit-D Deficiency, Fractures) -     CK -     Comprehensive metabolic panel -     Testosterone, Free, Total, SHBG -     CBC with Differential/Platelet  Erectile dysfunction, unspecified erectile dysfunction type -     Testosterone -     Sildenafil Citrate; Take 0.5-1 tablets (50-100 mg total) by mouth daily as needed for erectile dysfunction.  Dispense: 5 tablet; Refill: 11  Arthritis Assessment & Plan: Stop spironolactone Check labs as recommended by artificial intelligence     Through a comprehensive analysis of anonymized patient data, we informed an AI-generated report. All AI suggestions underwent careful evaluation against established guidelines to ensure optimal patient care. During our collaborative review of the report's findings, we discussed potential diagnoses, recommended tests, treatment options, and next steps. I explained the rationale behind the AI recommendations, integrating my clinical judgment for the patient's understanding.  In collaboration with the patient, we explored the potential benefits and risks associated with certain categories of AI-suggested interventions. For example, we discussed the advantages and considerations of more aggressive treatment options. Ultimately, the patient elected to closely monitor the situation based on their preferences and our shared understanding of their condition. Patient's best interests and evidence-based practices guided  all decisions, respecting their autonomy. Moving forward, we will continue close monitoring and revisit these options at the next visit or sooner if their condition changes. The patient was encouraged to keep me informed of any developments."  AI-Generated Report (red):  Assessment:  A 42 year old male with a significant history of alcohol use disorder, now in recovery, presents with widespread  joint pain and stiffness, fatigue, low libido, and erectile dysfunction, following a recent episode of acute liver failure. The constellation of symptoms, including polyarticular joint pain, systemic symptoms, and recent severe liver disease, suggests a complex interplay of alcohol-related liver disease complications and possible alcohol-related musculoskeletal manifestations. The differential diagnosis for his joint symptoms includes alcoholic myopathy, secondary hypogonadism (contributing to his low libido and erectile dysfunction), and potentially an inflammatory arthritis exacerbated by liver disease. The patient's history of hepatic encephalopathy, portal hypertension, and other complications of liver disease further complicates the clinical picture, necessitating a comprehensive approach to management.  Differential Diagnosis (DDx): - Alcoholic Myopathy - Secondary Hypogonadism - Inflammatory Arthritis - Osteoarthritis (considering the chronicity of symptoms) - Hemochromatosis (given liver disease and joint involvement, though less likely without a history of iron overload)  Plan:  Alcohol Use Disorder - Continue acamprosate as part of relapse prevention strategy. - Encourage engagement with behavioral health support, considering the patient's cravings and history of severe alcohol use disorder.  Joint Pain and Weakness - Dx:   - Rheumatologic panel including RF, anti-CCP, ANA, and ESR/CRP to evaluate for inflammatory arthritis.   - Serum creatine kinase (CK) to assess for myopathy.   - MRI of affected joints if indicated by severity of symptoms or lack of clarity from initial workup. - Tx:   - Optimize pain management, considering potential liver toxicity; avoid NSAIDs given liver disease. Consider acetaminophen at safe doses or tramadol for pain control.   - Physical therapy referral for joint stiffness and pain management.   - Vitamin D and calcium supplementation if not  contraindicated, given potential for osteopenia/osteoporosis in chronic liver disease.  Fatigue, Low Libido, and Erectile Dysfunction - Dx:   - Total and free testosterone levels to evaluate for hypogonadism.   - Thyroid function tests, considering elevated TSH. - Tx:   - Address potential secondary hypogonadism based on endocrinology consultation.   - Consider phosphodiesterase type 5 inhibitors for erectile dysfunction after evaluating cardiovascular risk.   - Management of fatigue will be targeted based on underlying cause; ensure adequate nutrition and address potential metabolic deficiencies.  Liver Disease Management - Dx:   - Continue monitoring liver function tests, CBC, and coagulation profile.   - Assess need for updated imaging (ultrasound with Doppler) to evaluate liver architecture and portal hypertension status. - Tx:   - Lactulose and rifaximin continuation for prevention of hepatic encephalopathy recurrence.   - Nutritional consultation to ensure adequate protein intake and vitamin supplementation, particularly B vitamins.   - Monitor and manage complications of cirrhosis, including portal hypertension and ascites, with diuretics adjustment as needed.  Monitoring and Follow-Up - Regular follow-up with hepatology for liver disease management. - Rheumatology referral for joint symptoms workup and management. - Endocrinology consultation for management of potential hypogonadism. - Close monitoring of mental health and support for alcohol use disorder recovery.  This comprehensive plan addresses the multifaceted aspects of the patient's presentation, aiming to optimize his quality of life and manage the complications associated with his alcohol use disorder and liver disease.    Orders: -     Rheumatoid factor -  Cyclic citrul peptide antibody, IgG -     Sedimentation rate -     C-reactive protein -     ANA -     CK  Insomnia, unspecified type -     traZODone  HCl; Take 1 tablet (50 mg total) by mouth at bedtime.  Dispense: 30 tablet; Refill: 3  Skin rash -     Ambulatory referral to Dermatology  Hyponatremia  Electrolyte abnormality  Elevated bilirubin  Folate deficiency  Thrombocytopenia  Other specified nutritional anemias  Subacute liver failure without hepatic coma  ANA positive Assessment & Plan: This note was addended to add this problem information to the problem overview on 11/06/22, to facilitate referrals to rheumatology that will result from the labs ordered. See problem overview for lab results assessments.   Low testosterone in male Assessment & Plan: Addendum added this problem 11/06/22 Results showed low free testosterone and high shbg... presumably liver induced secondary hypogonadism.  Offered referral to endocrinology   Other orders -     Anti-nuclear ab-titer (ANA titer)       Treatment plan discussed and reviewed in detail. Explained medication safety and potential side effects. Agreed on patient returning to office if symptoms worsen, persist, or new symptoms develop. Discussed precautions in case of needing to visit the Emergency Department. Answered all patient questions and confirmed understanding and comfort with the plan. Encouraged patient to contact our office if they have any questions or concerns.         Clinical Presentation:   42 y.o. male here today for Pain (C/o having joint pain and weakness x months.   Has been taking Ibuprofen.  Complaining of diffuse arthritis), Erectile Dysfunction (And low libido), and skin rash  HPI   Updated chart data:  Problem  Ana Positive   Comprehensive Review of the Case: A 42 year old male with a history of alcohol use disorder in early remission following acute hepatitis, presenting with joint pain and weakness for several months, stiffness and pain in shoulders, knuckles, knees, and hands, episodes of jerking awake due to joint pain, low libido, fatigue,  and erectile dysfunction post-liver failure. His medical history includes alcoholic cirrhosis, hyponatremia, anemia, thrombocytopenia, folate deficiency, portal hypertension, alcoholic hepatitis with ascites, restless legs, elevated bilirubin, and electrolyte abnormality. He has experienced acute hepatic encephalopathy three months prior. Current medications include acamprosate, folic acid, furosemide, gabapentin, lactulose, potassium chloride, ropinirole, spironolactone, thiamine, and trazodone. Recent labs show normalization of CBC and CMP, with platelets improving to 220 from 110. Sed rate is elevated at 29, glucose is normal at 97, serum sex hormone-binding globulin is high at 139.0, testosterone levels are within normal range but free testosterone is low at 2.1, ANA is positive with a mitotic spindle fibers pattern at a titer of 1:40, cyclic citrullinated peptide antibody and RA latex turbidity are negative. Most Likely Diagnoses: Autoimmune Hepatitis (AIH): The presence of joint pain, positive ANA with a specific pattern, and liver disease could suggest AIH superimposed on alcoholic liver disease. AIH could explain the systemic symptoms, including fatigue and erectile dysfunction, through mechanisms of chronic inflammation and autoimmune activity. However, the absence of more specific autoantibodies typically associated with AIH and the context of significant alcohol use make this diagnosis less straightforward. Hepatic Osteodystrophy: Liver disease, particularly cirrhosis, can lead to metabolic bone diseases, including osteoporosis and osteomalacia, which might explain the diffuse joint and bone pain. The patient's history of alcohol use disorder, liver failure, and associated nutritional deficiencies (e.g., folate) could contribute to  bone metabolism disorders. This condition would not directly explain the positive ANA or low free testosterone levels, suggesting a multifactorial process. Endocrine  Dysfunction Secondary to Liver Disease: The patient's low libido, erectile dysfunction, and low free testosterone level in the context of liver disease suggest a diagnosis of hypogonadism secondary to liver cirrhosis (also known as cirrhotic hypogonadism). This condition can result from impaired metabolism of sex hormones by the liver and can contribute to fatigue and muscle weakness. The joint pain might be indirectly related through associated muscle weakness or could be part of a separate process. Expanded Differential Diagnoses: Rheumatoid Arthritis (RA): Despite the negative RA latex turbidity and cyclic citrullinated peptide antibody, RA could still be considered given the joint symptoms. However, the typical serological markers are absent, and the patient's symptoms might be atypical for RA. The positive ANA and systemic symptoms could overlap with RA, but the diagnosis is less likely without more specific findings. Secondary Sjgren's Syndrome: The presence of a positive ANA and systemic symptoms could suggest Sjgren's syndrome, potentially secondary to another autoimmune condition or liver disease. However, the lack of typical symptoms such as dry eyes and mouth makes this diagnosis less likely. Fibromyalgia: Considering the widespread pain, fatigue, and sleep disturbances (jerking awake from pain), fibromyalgia could be a consideration. However, fibromyalgia would not explain the abnormal laboratory findings, suggesting it could be a comorbid condition rather than the primary diagnosis.   Low Testosterone in Male  Arthritis   My clinical judgment, informed by a comprehensive review of all available data (including the AI analysis from [facility]) guides the development of the optimal treatment plan for the patient. While AI insights provide valuable information, they are always interpreted within the context of the patient's unique medical history, presenting complaints, and physical examination  findings. Any deviations from AI-generated suggestions are meticulously documented and clearly explained to ensure transparency and optimal patient care. This approach underscores both the patient's well-being and my professional responsibility as their clinician.   AI-Generated Clinical Decision Support Report from [facility] (conducted on 11/02/2022 using de-identified patient data)  Comprehensive Review of the Case: A 42 year old male with a complex medical history including alcohol use disorder, panic attack, alcoholic cirrhosis of the liver, acute and chronic liver failure, hyponatremia, drug-induced hypokalemia, anemia, thrombocytopenia, folate deficiency, hepatic encephalopathy, portal hypertension, alcoholic hepatitis with ascites, restless legs syndrome, elevated bilirubin, and electrolyte abnormality presents with joint pain and weakness, stiffness in shoulders, knuckles, knees, and hands, jerking awake from sleep due to joint pain, low libido, fatigue, and erectile dysfunction following recent liver failure. He reports abstinence from alcohol and is on medications including acamprosate, folic acid, furosemide, gabapentin, lactulose, potassium chloride, ropinirole, spironolactone, thiamine, and trazodone. Laboratory findings reveal Na 131, K 3.6, Cl 94, CO2 31, glucose 9, creatinine 0.67, Ca 8.0, protein 6.5, albumin 2.6, AST 139, ALT 54, AlkP 187, bilirubin 4.5, WBC 8.7, HGB 12.7, HCT 36.3, MCV 117.5, platelets 121, cholesterol 128, HDL 27.7, LDL calculated 81, triglycerides 96.0, A1C 4.3, TSH 4.95, folate 1.6, B12 452, with no PSA result provided.  Clinical Problem Representation: A 42 year old male with a history of alcohol use disorder and multiple complications thereof, including cirrhosis and recent liver failure, presents with multisystem complaints including musculoskeletal (joint pain and stiffness, weakness), endocrine (low libido, erectile dysfunction), and hematologic (anemia,  thrombocytopenia) symptoms, in the context of ongoing recovery from liver failure and comprehensive medication management. The patient's symptoms are multifactorial, with potential contributions from liver disease, medication side effects, and nutritional  deficiencies.   Most Likely Diagnoses: - Hepatic Osteodystrophy: The patient's chronic liver disease and cirrhosis can lead to hepatic osteodystrophy, a condition that includes bone pain and musculoskeletal symptoms due to metabolic bone disease associated with chronic liver dysfunction. The joint pain and stiffness described could be manifestations of this condition, exacerbated by electrolyte imbalances and vitamin deficiencies common in liver failure. - Hypogonadism: Given the patient's low libido and erectile dysfunction in the context of liver failure, hypogonadism is a likely diagnosis. Liver disease can impair the metabolism of sex hormones, leading to symptoms of hypogonadism. The elevated TSH may also suggest a component of secondary hypothyroidism, which can further impact sexual function. - Medication Side Effects: The patient is on multiple medications, including gabapentin and spironolactone, which can contribute to fatigue, weakness, and potentially impact sexual function. Gabapentin can cause sedation and weakness, while spironolactone, a potassium-sparing diuretic, can lead to hyperkalemia and contribute to symptoms of weakness and fatigue, although the patient's potassium is currently within normal limits.   Expanded Differential Diagnoses: - Rheumatoid Arthritis (RA): While less likely given the absence of specific inflammatory markers or autoimmune history, RA could potentially explain the joint pain and stiffness. However, the pattern of joint involvement and the patient's primary liver disease make this less likely without further supporting evidence. - Vitamin D Deficiency: Common in patients with chronic liver disease due to  malabsorption, vitamin D deficiency could contribute to bone pain and weakness. This condition would be supported by the patient's overall nutritional deficiencies and liver disease but would require specific vitamin D level testing for confirmation. - Secondary Hyperparathyroidism: Resulting from chronic liver disease and potential vitamin D deficiency, secondary hyperparathyroidism could explain bone pain and musculoskeletal symptoms. This condition results from compensatory parathyroid hormone (PTH) secretion in response to low calcium levels, which could be exacerbated by the patient's electrolyte imbalances and nutritional deficiencies.   Can't Miss Differential Diagnosis: - Osteoporotic Fractures: Given the patient's chronic liver disease, nutritional deficiencies, and potential for hepatic osteodystrophy, there is a risk for osteoporosis and subsequent fractures, which could present with acute or chronic pain and should not be missed. - Hepatic Encephalopathy: While the patient has a history of hepatic encephalopathy, any acute worsening of mental status or new neurological symptoms could indicate a life-threatening exacerbation, especially in the context of liver failure and electrolyte imbalances. - Spontaneous Bacterial Peritonitis (SBP): In the context of cirrhosis and ascites, SBP is a life-threatening condition that can present subtly with fatigue, abdominal pain, or worsening ascites, and requires prompt recognition and treatment.  This case presents a complex interplay of chronic liver disease, medication effects, and potential endocrine and metabolic disturbances, necessitating a comprehensive approach to diagnosis and management.    Alcohol Use Disorder   Interim history: patient reports not a drop of alcohol, does get cravings, not doing 12 steps or behavioral healt Needs gabapentin refills- feels working well at current dose.   Prior history: Not a drip 3/4-3/11 2024 History  severe with alcoholic hepatitis and acute liver failure that seems to be currently resolving.       Reviewed chart data: Active Ambulatory Problems    Diagnosis Date Noted   Alcohol use disorder 08/20/2022   Alcoholic cirrhosis of liver (HCC) 08/20/2022   Drug-induced hypokalemia 08/21/2022   Anemia 08/21/2022   Folate deficiency 08/22/2022   Portal hypertension (HCC) 08/30/2022   Restless legs 08/30/2022   Arthritis 11/02/2022   Erectile dysfunction 11/02/2022   Other fatigue 11/02/2022   Insomnia 11/02/2022  ANA positive 11/06/2022   Low testosterone in male 11/06/2022   Resolved Ambulatory Problems    Diagnosis Date Noted   Liver mass, ruled out 08/20/2022   Overweight 08/20/2022   Panic attack 08/20/2022   Delirium tremens (HCC) 08/20/2022   Liver failure without hepatic coma (HCC) 08/20/2022   Alcohol withdrawal (HCC) 08/21/2022   Alcohol dependence with hallucinations (HCC) 08/21/2022   Hyponatremia 08/21/2022   Thrombocytopenia 08/22/2022   Encephalopathy, hepatic (HCC) 08/23/2022   Alcoholic hepatitis with ascites 08/30/2022   Elevated bilirubin 09/06/2022   Electrolyte abnormality 09/06/2022   Past Medical History:  Diagnosis Date   Acute hepatic encephalopathy (HCC) 08/23/2022   Anxiety    Depression    Substance abuse (HCC)     Outpatient Medications Prior to Visit  Medication Sig   folic acid (FOLVITE) 1 MG tablet Take 1 tablet (1 mg total) by mouth daily.   furosemide (LASIX) 40 MG tablet Take 1 tablet (40 mg total) by mouth daily.   lactulose (CHRONULAC) 10 GM/15ML solution Take 45 mLs by mouth 3 times daily.   potassium chloride (KLOR-CON M) 10 MEQ tablet Take 1 tablet (10 mEq total) by mouth daily.   potassium chloride (MICRO-K) 10 MEQ CR capsule 1 capsule with food Orally Twice a day   rOPINIRole (REQUIP) 0.25 MG tablet Take 1 tablet (0.25 mg total) by mouth 3 (three) times daily.   spironolactone (ALDACTONE) 100 MG tablet Take 1 tablet (100 mg  total) by mouth daily.   thiamine (VITAMIN B-1) 100 MG tablet Take 1 tablet (100 mg total) by mouth daily.   [DISCONTINUED] acamprosate (CAMPRAL) 333 MG tablet Take 2 tablets (666 mg total) by mouth 3 (three) times daily with meals.   [DISCONTINUED] gabapentin (NEURONTIN) 400 MG capsule Take 1 capsule (400 mg total) by mouth 3 (three) times daily. Take by mouth: 1 tablet in the mornings, 1 tablet in mid-day, and 2 tablets at bedtime Note dose adjusted from prior  to help treat restless legs, discontinue ropinirole   [DISCONTINUED] traZODone (DESYREL) 50 MG tablet Take 1 tablet (50 mg total) by mouth at bedtime. (Patient not taking: Reported on 11/02/2022)   No facility-administered medications prior to visit.           Clinical Data Analysis:   Physical Exam  BP 124/68   Pulse 95   Temp 97.8 F (36.6 C) (Temporal)   Ht 5\' 7"  (1.702 m)   Wt 163 lb 12.8 oz (74.3 kg)   SpO2 100%   BMI 25.65 kg/m  Wt Readings from Last 10 Encounters:  11/02/22 163 lb 12.8 oz (74.3 kg)  09/06/22 159 lb (72.1 kg)  08/30/22 175 lb 12.8 oz (79.7 kg)  08/21/22 186 lb 3.2 oz (84.5 kg)  08/19/22 186 lb 3.2 oz (84.5 kg)  06/23/21 165 lb (74.8 kg)  05/22/18 180 lb (81.6 kg)  02/23/18 165 lb (74.8 kg)   Vital signs reviewed.  Nursing notes reviewed. Weight trend reviewed. Abnormalities and Problem-Specific physical exam findings:  miliary rash on face, tired appearing, icterus resolved. General Appearance:  No acute distress appreciable.   Well-groomed, healthy-appearing male.  Well proportioned with no abnormal fat distribution.  Good muscle tone. Skin: Clear and well-hydrated. Pulmonary:  Normal work of breathing at rest, no respiratory distress apparent. SpO2: 100 %  Musculoskeletal: All extremities are intact.  Neurological:  Awake, alert, oriented, and engaged.  No obvious focal neurological deficits or cognitive impairments.  Sensorium seems unclouded.   Speech is clear and  coherent with logical  content. Psychiatric:  Appropriate mood, pleasant and cooperative demeanor, thoughtful and engaged during the exam   Additional Results Reviewed:     Results for orders placed or performed in visit on 11/02/22  Rheumatoid Factor  Result Value Ref Range   Rheumatoid fact SerPl-aCnc <10 <14 IU/mL  Cyclic citrul peptide antibody, IgG ([facility])  Result Value Ref Range   Cyclic Citrullin Peptide Ab <29 UNITS  Testosterone  Result Value Ref Range   Testosterone 362.20 300.00 - 890.00 ng/dL  VITAMIN D 25 Hydroxy (Vit-D Deficiency, Fractures)  Result Value Ref Range   VITD 12.47 (L) 30.00 - 100.00 ng/mL  Sedimentation rate  Result Value Ref Range   Sed Rate 29 (H) 0 - 15 mm/hr  C-reactive protein  Result Value Ref Range   CRP <1.0 0.5 - 20.0 mg/dL  Antinuclear Antib (ANA)  Result Value Ref Range   Anti Nuclear Antibody (ANA) POSITIVE (A) NEGATIVE  CK (Creatine Kinase)  Result Value Ref Range   Total CK 119 7 - 232 U/L  Comp Met (CMET)  Result Value Ref Range   Sodium 137 135 - 145 mEq/L   Potassium 4.1 3.5 - 5.1 mEq/L   Chloride 101 96 - 112 mEq/L   CO2 28 19 - 32 mEq/L   Glucose, Bld 97 70 - 99 mg/dL   BUN 10 6 - 23 mg/dL   Creatinine, Ser 5.62 0.40 - 1.50 mg/dL   Total Bilirubin 0.9 0.2 - 1.2 mg/dL   Alkaline Phosphatase 105 39 - 117 U/L   AST 37 0 - 37 U/L   ALT 20 0 - 53 U/L   Total Protein 6.8 6.0 - 8.3 g/dL   Albumin 3.6 3.5 - 5.2 g/dL   GFR 130.86 >57.84 mL/min   Calcium 9.3 8.4 - 10.5 mg/dL  Testosterone, Free, Total, SHBG  Result Value Ref Range   Testosterone 649 264 - 916 ng/dL   Testosterone, Free 2.1 (L) 8.7 - 25.1 pg/mL   Sex Hormone Binding 139.0 (H) 16.5 - 55.9 nmol/L  CBC with Differential/Platelet  Result Value Ref Range   WBC 5.2 4.0 - 10.5 K/uL   RBC 3.91 (L) 4.22 - 5.81 Mil/uL   Hemoglobin 12.3 (L) 13.0 - 17.0 g/dL   HCT 69.6 (L) 29.5 - 28.4 %   MCV 95.5 78.0 - 100.0 fl   MCHC 33.0 30.0 - 36.0 g/dL   RDW 13.2 44.0 - 10.2 %   Platelets 221.0  150.0 - 400.0 K/uL   Neutrophils Relative % 46.5 43.0 - 77.0 %   Lymphocytes Relative 36.5 12.0 - 46.0 %   Monocytes Relative 14.3 (H) 3.0 - 12.0 %   Eosinophils Relative 1.9 0.0 - 5.0 %   Basophils Relative 0.8 0.0 - 3.0 %   Neutro Abs 2.4 1.4 - 7.7 K/uL   Lymphs Abs 1.9 0.7 - 4.0 K/uL   Monocytes Absolute 0.7 0.1 - 1.0 K/uL   Eosinophils Absolute 0.1 0.0 - 0.7 K/uL   Basophils Absolute 0.0 0.0 - 0.1 K/uL  Anti-nuclear ab-titer (ANA titer)  Result Value Ref Range   ANA Titer 1 1:40 (H) titer   ANA Pattern 1 Mitotic, Spindle Fibers (A)    ANA TITER 1:40 (H) titer   ANA PATTERN Cytoplasmic (A)    ANA TITER 1:40 (H) titer   ANA PATTERN Nuclear, Fine Speckled (A)     Recent Results (from the past 2160 hour(s))  AFP tumor marker     Status: None  Collection Time: 08/19/22 12:01 PM  Result Value Ref Range   AFP-Tumor Marker 4.2 <6.1 ng/mL    Comment: . This test was performed using the Beckman Coulter chemiluminescent method. Values obtained from different assay methods cannot be used interchangeably. AFP levels, regardless of value, should not be interpreted as absolute evidence of the presence or absence of disease. .   Lipid panel     Status: Abnormal   Collection Time: 08/19/22 12:01 PM  Result Value Ref Range   Cholesterol 128 0 - 200 mg/dL    Comment: ATP III Classification       Desirable:  < 200 mg/dL               Borderline High:  200 - 239 mg/dL          High:  > = 409 mg/dL   Triglycerides 81.1 0.0 - 149.0 mg/dL    Comment: Normal:  <914 mg/dLBorderline High:  150 - 199 mg/dL   HDL 78.29 (L) >56.21 mg/dL   VLDL 30.8 0.0 - 65.7 mg/dL   LDL Cholesterol 81 0 - 99 mg/dL   Total CHOL/HDL Ratio 5     Comment:                Men          Women1/2 Average Risk     3.4          3.3Average Risk          5.0          4.42X Average Risk          9.6          7.13X Average Risk          15.0          11.0                       NonHDL 99.98     Comment: NOTE:  Non-HDL goal  should be 30 mg/dL higher than patient's LDL goal (i.e. LDL goal of < 70 mg/dL, would have non-HDL goal of < 100 mg/dL)  TSH     Status: None   Collection Time: 08/19/22 12:01 PM  Result Value Ref Range   TSH 4.95 0.35 - 5.50 uIU/mL  HgB A1c     Status: Abnormal   Collection Time: 08/19/22 12:01 PM  Result Value Ref Range   Hgb A1c MFr Bld 4.3 (L) 4.6 - 6.5 %    Comment: Glycemic Control Guidelines for People with Diabetes:Non Diabetic:  <6%Goal of Therapy: <7%Additional Action Suggested:  >8%   Comprehensive metabolic panel     Status: Abnormal   Collection Time: 08/19/22  3:16 PM  Result Value Ref Range   Sodium 134 (L) 135 - 145 mEq/L   Potassium 3.4 (L) 3.5 - 5.1 mEq/L   Chloride 87 (L) 96 - 112 mEq/L   CO2 33 (H) 19 - 32 mEq/L   Glucose, Bld 95 70 - 99 mg/dL   BUN 6 6 - 23 mg/dL   Creatinine, Ser 8.46 0.40 - 1.50 mg/dL   Total Bilirubin 3.6 (H) 0.2 - 1.2 mg/dL   Alkaline Phosphatase 295 (H) 39 - 117 U/L   AST 232 (H) 0 - 37 U/L   ALT 56 (H) 0 - 53 U/L   Total Protein 6.5 6.0 - 8.3 g/dL   Albumin 3.0 (L) 3.5 - 5.2 g/dL   GFR 962.95 >28.41 mL/min  Comment: Calculated using the CKD-EPI Creatinine Equation (2021)   Calcium 8.2 (L) 8.4 - 10.5 mg/dL  CBC     Status: Abnormal   Collection Time: 08/19/22  3:16 PM  Result Value Ref Range   WBC 9.3 4.0 - 10.5 K/uL   RBC 3.14 (L) 4.22 - 5.81 Mil/uL   Platelets 150.0 150.0 - 400.0 K/uL   Hemoglobin 12.7 (L) 13.0 - 17.0 g/dL   HCT 09.8 (L) 11.9 - 14.7 %   MCV 117.5 Repeated and verified X2. (H) 78.0 - 100.0 fl   MCHC 34.5 30.0 - 36.0 g/dL   RDW 82.9 (H) 56.2 - 13.0 %  Osmolality, urine     Status: None   Collection Time: 08/21/22  5:09 AM  Result Value Ref Range   Osmolality, Ur 776 300 - 900 mOsm/kg    Comment: Performed at [facility], [address] [place], [place] [address]  Comprehensive metabolic panel     Status: Abnormal   Collection Time: 08/21/22  2:32 PM  Result Value Ref Range   Sodium 127 (L) 135 - 145  mmol/L   Potassium 3.1 (L) 3.5 - 5.1 mmol/L   Chloride 87 (L) 98 - 111 mmol/L   CO2 30 22 - 32 mmol/L   Glucose, Bld 100 (H) 70 - 99 mg/dL    Comment: Glucose reference range applies only to samples taken after fasting for at least 8 hours.   BUN 9 6 - 20 mg/dL   Creatinine, Ser 4.69 (L) 0.61 - 1.24 mg/dL   Calcium 7.9 (L) 8.9 - 10.3 mg/dL   Total Protein 6.7 6.5 - 8.1 g/dL   Albumin 2.5 (L) 3.5 - 5.0 g/dL   AST 629 (H) 15 - 41 U/L   ALT 73 (H) 0 - 44 U/L   Alkaline Phosphatase 232 (H) 38 - 126 U/L   Total Bilirubin 7.5 (H) 0.3 - 1.2 mg/dL   GFR, Estimated >52 >84 mL/min    Comment: (NOTE) Calculated using the CKD-EPI Creatinine Equation (2021)    Anion gap 10 5 - 15    Comment: Performed at [facility], [address] [place], [place] [address]  Ethanol     Status: None   Collection Time: 08/21/22  2:32 PM  Result Value Ref Range   Alcohol, Ethyl (B) <10 <10 mg/dL    Comment: (NOTE) Lowest detectable limit for serum alcohol is 10 mg/dL.  For medical purposes only. Performed at [facility], [address] [place], [place] [address]   cbc     Status: Abnormal   Collection Time: 08/21/22  2:32 PM  Result Value Ref Range   WBC 13.3 (H) 4.0 - 10.5 K/uL   RBC 3.00 (L) 4.22 - 5.81 MIL/uL   Hemoglobin 12.0 (L) 13.0 - 17.0 g/dL   HCT 25.3 (L) 66.4 - 40.3 %   MCV 108.3 (H) 80.0 - 100.0 fL   MCH 40.0 (H) 26.0 - 34.0 pg   MCHC 36.9 (H) 30.0 - 36.0 g/dL   RDW 47.4 25.9 - 56.3 %   Platelets 123 (L) 150 - 400 K/uL    Comment: SPECIMEN CHECKED FOR CLOTS REPEATED TO VERIFY    nRBC 0.0 0.0 - 0.2 %    Comment: Performed at [facility], [address] [place], [place] [address]  Lipase, blood     Status: None   Collection Time: 08/21/22  2:32 PM  Result Value Ref Range  Lipase 39 11 - 51 U/L    Comment: Performed at [facility], [address] [place], [place] [address]  Protime-INR     Status: Abnormal   Collection Time:  08/21/22  3:41 PM  Result Value Ref Range   Prothrombin Time 16.1 (H) 11.4 - 15.2 seconds   INR 1.3 (H) 0.8 - 1.2    Comment: (NOTE) INR goal varies based on device and disease states. Performed at [facility], [address] [place], [place] [address]   Ammonia     Status: None   Collection Time: 08/21/22  3:41 PM  Result Value Ref Range   Ammonia 28 9 - 35 umol/L    Comment: Performed at [facility], [address] [place], [place] [address]  Rapid urine drug screen (hospital performed)     Status: None   Collection Time: 08/21/22  6:26 PM  Result Value Ref Range   Opiates NONE DETECTED NONE DETECTED   Cocaine NONE DETECTED NONE DETECTED   Benzodiazepines NONE DETECTED NONE DETECTED   Amphetamines NONE DETECTED NONE DETECTED   Tetrahydrocannabinol NONE DETECTED NONE DETECTED   Barbiturates NONE DETECTED NONE DETECTED    Comment: (NOTE) DRUG SCREEN FOR MEDICAL PURPOSES ONLY.  IF CONFIRMATION IS NEEDED FOR ANY PURPOSE, NOTIFY LAB WITHIN 5 DAYS.  LOWEST DETECTABLE LIMITS FOR URINE DRUG SCREEN Drug Class                     Cutoff (ng/mL) Amphetamine and metabolites    1000 Barbiturate and metabolites    200 Benzodiazepine                 200 Opiates and metabolites        300 Cocaine and metabolites        300 THC                            50 Performed at [facility], [address] [place], [place] [address]   Sodium, urine, random     Status: None   Collection Time: 08/21/22  6:26 PM  Result Value Ref Range   Sodium, Ur <10 mmol/L    Comment: Performed at [facility], [address] [place], [place] [address]  Chloride, urine, random     Status: None   Collection Time: 08/21/22  6:26 PM  Result Value Ref Range   Chloride Urine 19 mmol/L    Comment: Performed at [facility], [address] [place], [place] [address]  HIV Antibody (routine testing w rflx)     Status: None   Collection Time: 08/21/22 10:12 PM   Result Value Ref Range   HIV Screen 4th Generation wRfx Non Reactive Non Reactive    Comment: Performed at [facility], [address], [place], [place] [address]  Osmolality     Status: None   Collection Time: 08/21/22 10:12 PM  Result Value Ref Range   Osmolality 283 275 - 295 mOsm/kg    Comment: PERFORMED AT [facility] Performed at [facility], [address], [place], [place] [address]   Vitamin B12     Status: None   Collection Time: 08/21/22 10:12 PM  Result Value Ref Range   Vitamin B-12 452 180 - 914 pg/mL    Comment: (NOTE) This assay is not validated for  testing neonatal or myeloproliferative syndrome specimens for Vitamin B12 levels. Performed at [facility], [address] [place], [place] [address]   Folate     Status: Abnormal   Collection Time: 08/21/22 10:12 PM  Result Value Ref Range   Folate 1.6 (L) >5.9 ng/mL    Comment: Performed at [facility], [address], [place], [place] [address]  CBC     Status: Abnormal   Collection Time: 08/22/22  4:08 AM  Result Value Ref Range   WBC 9.4 4.0 - 10.5 K/uL   RBC 2.95 (L) 4.22 - 5.81 MIL/uL   Hemoglobin 11.6 (L) 13.0 - 17.0 g/dL   HCT 09.8 (L) 11.9 - 14.7 %   MCV 111.5 (H) 80.0 - 100.0 fL   MCH 39.3 (H) 26.0 - 34.0 pg   MCHC 35.3 30.0 - 36.0 g/dL   RDW 82.9 (H) 56.2 - 13.0 %   Platelets 105 (L) 150 - 400 K/uL   nRBC 0.2 0.0 - 0.2 %    Comment: Performed at [facility], [address], [place], [place] [address]  Comprehensive metabolic panel     Status: Abnormal   Collection Time: 08/22/22  4:08 AM  Result Value Ref Range   Sodium 126 (L) 135 - 145 mmol/L   Potassium 3.1 (L) 3.5 - 5.1 mmol/L   Chloride 89 (L) 98 - 111 mmol/L   CO2 31 22 - 32 mmol/L   Glucose, Bld 104 (H) 70 - 99 mg/dL    Comment: Glucose reference range applies only to samples taken after fasting for at least 8 hours.   BUN 12 6 - 20 mg/dL   Creatinine,  Ser 4.69 0.61 - 1.24 mg/dL   Calcium 7.7 (L) 8.9 - 10.3 mg/dL   Total Protein 6.4 (L) 6.5 - 8.1 g/dL   Albumin 2.5 (L) 3.5 - 5.0 g/dL   AST 629 (H) 15 - 41 U/L   ALT 60 (H) 0 - 44 U/L   Alkaline Phosphatase 208 (H) 38 - 126 U/L   Total Bilirubin 6.3 (H) 0.3 - 1.2 mg/dL   GFR, Estimated >52 >84 mL/min    Comment: (NOTE) Calculated using the CKD-EPI Creatinine Equation (2021)    Anion gap 6 5 - 15    Comment: Performed at [facility], [address], [place], [place] [address]  Hepatitis panel, acute     Status: None   Collection Time: 08/23/22  3:26 AM  Result Value Ref Range   Hepatitis B Surface Ag NON REACTIVE NON REACTIVE   HCV Ab NON REACTIVE NON REACTIVE    Comment: (NOTE) Nonreactive HCV antibody screen is consistent with no HCV infections,  unless recent infection is suspected or other evidence exists to indicate HCV infection.     Hep A IgM NON REACTIVE NON REACTIVE   Hep B C IgM NON REACTIVE NON REACTIVE    Comment: Performed at [facility], [address] [place], [place] [address]  Ammonia     Status: Abnormal   Collection Time: 08/23/22  3:26 AM  Result Value Ref Range   Ammonia 53 (H) 9 - 35 umol/L    Comment: Performed at [facility], [address] [place], [place] [address]  Comprehensive metabolic panel     Status: Abnormal   Collection Time: 08/23/22  3:26 AM  Result Value Ref Range   Sodium 130 (L) 135 - 145 mmol/L   Potassium 3.4 (  L) 3.5 - 5.1 mmol/L   Chloride 91 (L) 98 - 111 mmol/L   CO2 32 22 - 32 mmol/L   Glucose, Bld 111 (H) 70 - 99 mg/dL    Comment: Glucose reference range applies only to samples taken after fasting for at least 8 hours.   BUN 10 6 - 20 mg/dL   Creatinine, Ser 0.98 (L) 0.61 - 1.24 mg/dL   Calcium 8.0 (L) 8.9 - 10.3 mg/dL   Total Protein 6.1 (L) 6.5 - 8.1 g/dL   Albumin 2.4 (L) 3.5 - 5.0 g/dL   AST 119 (H) 15 - 41 U/L   ALT 54 (H) 0 - 44 U/L   Alkaline Phosphatase 189 (H) 38 -  126 U/L   Total Bilirubin 4.3 (H) 0.3 - 1.2 mg/dL   GFR, Estimated >14 >78 mL/min    Comment: (NOTE) Calculated using the CKD-EPI Creatinine Equation (2021)    Anion gap 7 5 - 15    Comment: Performed at [facility], [address], [place], [place] [address]  Protime-INR     Status: Abnormal   Collection Time: 08/23/22  3:26 AM  Result Value Ref Range   Prothrombin Time 16.0 (H) 11.4 - 15.2 seconds   INR 1.3 (H) 0.8 - 1.2    Comment: (NOTE) INR goal varies based on device and disease states. Performed at [facility], [address], [place], [place] [address]   CBC     Status: Abnormal   Collection Time: 08/23/22  3:26 AM  Result Value Ref Range   WBC 8.4 4.0 - 10.5 K/uL   RBC 2.78 (L) 4.22 - 5.81 MIL/uL   Hemoglobin 11.1 (L) 13.0 - 17.0 g/dL   HCT 57.8 (L) 46.9 - 62.9 %   MCV 114.7 (H) 80.0 - 100.0 fL   MCH 39.9 (H) 26.0 - 34.0 pg   MCHC 34.8 30.0 - 36.0 g/dL   RDW 52.8 (H) 41.3 - 24.4 %   Platelets 113 (L) 150 - 400 K/uL    Comment: SPECIMEN CHECKED FOR CLOTS Immature Platelet Fraction may be clinically indicated, consider ordering this additional test [id number] REPEATED TO VERIFY    nRBC 0.0 0.0 - 0.2 %    Comment: Performed at [facility], 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 ASCITES, PARACENTESIS:   FINAL MICROSCOPIC DIAGNOSIS: - No malignant cells identified - Numerous benign mesothelial cells with scattered lymphocytes within a serous background  SPECIMEN ADEQUACY: Satisfactory for evaluation  GROSS: Received is/are 1000 ccs of yellow fluid. (CM:cm) Prepared: Smears:   0 Concentration Method (Thin Prep):  1 Cell Block:  1 Additional Studies:  n/a     Final Diagnosis performed by Jerene Bears, MD.   Electronically signed 08/25/2022 Technical component performed at [facility], [address] [place], [place] [address].  Professional component performed at [facility] [employer], [address] [place], [place] [address].  Immunohistochemistry Technical component (if applicable) was performed at [facility]. [address], [place], [place] [address].   IMMUNOHISTOCHEMISTRY DISCLAIMER (if applicable): Some of these immunohistochemical stains  may have been developed and the performance characteristics determine by [facility]. Some may not have been cleared or approved by the U.S. Food and Drug Administration. The FDA has determined that such clearance or approval is not necessary. This test is used for clinical purposes. It should not be regarded as investigational or for research. This laboratory is certified under the Clinical Laboratory Improvement Amendments of 1988 (CLIA-88) as qualified to perform high complexity clinical laboratory testing.  The controls stained appropriately.   Body fluid cell count with differential     Status: Abnormal   Collection Time: 08/23/22 11:52 AM  Result Value Ref Range   Fluid Type-FCT CYTO PERI    Color, Fluid YELLOW (A) YELLOW   Appearance, Fluid CLEAR CLEAR   Total Nucleated Cell Count, Fluid 173 0 - 1,000 cu mm   Neutrophil Count, Fluid 7 0 - 25 %   Lymphs, Fluid 7 %   Monocyte-Macrophage-Serous Fluid 86 50 - 90 %   Eos, Fluid 0 %    Comment: Performed at [facility], [address], [place], [place] [address]  Albumin, pleural or peritoneal fluid      Status: None   Collection Time: 08/23/22 11:52 AM  Result Value Ref Range   Albumin, Fluid <1.5 g/dL    Comment: (NOTE) No normal range established for this  test Results should be evaluated in conjunction with serum values    Fluid Type-FALB CYTO PERI     Comment: Performed at [facility], [address] [place], [place] [address]  CBC     Status: Abnormal   Collection Time: 08/24/22  4:17 AM  Result Value Ref Range   WBC 8.7 4.0 - 10.5 K/uL   RBC 3.09 (L) 4.22 - 5.81 MIL/uL   Hemoglobin 12.7 (L) 13.0 - 17.0 g/dL   HCT 09.8 (L) 11.9 - 14.7 %   MCV 117.5 (H) 80.0 - 100.0 fL   MCH 41.1 (H) 26.0 - 34.0 pg   MCHC 35.0 30.0 - 36.0 g/dL   RDW 82.9 (H) 56.2 - 13.0 %   Platelets 121 (L) 150 - 400 K/uL   nRBC 0.2 0.0 - 0.2 %    Comment: Performed at [facility], [address], [place], [place] [address]  Comprehensive metabolic panel     Status: Abnormal   Collection Time: 08/24/22  4:17 AM  Result Value Ref Range   Sodium 131 (L) 135 - 145 mmol/L   Potassium 3.6 3.5 - 5.1 mmol/L   Chloride 94 (L) 98 - 111 mmol/L   CO2 31 22 - 32 mmol/L   Glucose, Bld 111 (H) 70 - 99 mg/dL    Comment: Glucose reference range applies only to samples taken after fasting for at least 8 hours.   BUN 9 6 - 20 mg/dL   Creatinine, Ser 4.69 0.61 - 1.24 mg/dL   Calcium 8.0 (L) 8.9 - 10.3 mg/dL   Total Protein 6.5 6.5 - 8.1 g/dL   Albumin 2.6 (L) 3.5 - 5.0 g/dL   AST 629 (H) 15 - 41 U/L   ALT 54 (H) 0 - 44 U/L   Alkaline Phosphatase 187 (H) 38 - 126 U/L   Total Bilirubin 4.5 (H) 0.3 - 1.2 mg/dL   GFR, Estimated >52 >84 mL/min    Comment: (NOTE) Calculated using the CKD-EPI Creatinine Equation (2021)    Anion gap 6 5 - 15    Comment: Performed at [facility],  [address], [place], [place] [address]  Protime-INR     Status: Abnormal   Collection Time: 08/24/22  4:17 AM  Result Value Ref Range   Prothrombin Time 15.6 (H) 11.4 - 15.2 seconds   INR 1.3 (H) 0.8 - 1.2    Comment: (NOTE) INR goal varies based on device and disease states. Performed at [facility], [address] [place], [place] [address]   Comprehensive metabolic panel     Status: None   Collection Time: 09/02/22  8:32 AM  Result Value Ref Range   EGFR 123.0     Comment: Abstracted by HIM  Protime-INR     Status: None   Collection Time: 09/02/22  8:47 AM  Result Value Ref Range   INR 1.10 0.80 - 1.20  Body fluid cell count with differential     Status: Abnormal   Collection Time: 09/03/22  1:57 PM  Result Value Ref Range   Fluid Type-FCT PERITONEAL     Comment: CORRECTED ON 03/08 AT 1529: PREVIOUSLY REPORTED AS ABDOMEN   Color, Fluid YELLOW YELLOW   Appearance, Fluid HAZY (A) CLEAR   Total Nucleated Cell Count, Fluid 210 0 - 1,000 cu mm   Neutrophil Count, Fluid 7 0 - 25 %   Lymphs, Fluid 32 %   Monocyte-Macrophage-Serous Fluid 61 50 - 90 %   Eos, Fluid 0 %   Other Cells, Fluid OTHER CELLS IDENTIFIED AS MESOTHELIAL CELLS %    Comment: Performed at [facility], [address] [place], [place] [address]  Albumin, pleural or peritoneal fluid      Status: None   Collection Time: 09/03/22  1:57 PM  Result Value Ref Range   Albumin, Fluid <1.5 g/dL    Comment: (NOTE) No normal range established for this test Results should be evaluated in conjunction with serum values    Fluid Type-FALB PERITONEAL     Comment: Performed at [facility], [address], [place], [place] [address] CORRECTED ON 03/08 AT 1529: PREVIOUSLY REPORTED AS ABDOMEN   Pathologist smear review     Status: None   Collection Time: 09/03/22  1:57 PM  Result Value Ref Range   Path Review      NEGATIVE FOR MALIGNANCY. BENIGN MESOTHELIAL CELLS AND CHRONIC INFLAMMATORY CELLS    Comment: Reviewed by Lily Lovings. Picklesimer, M.D. 09/07/2022 Performed at [facility], [address], [place], [place] [address]   Rheumatoid Factor     Status: None   Collection Time: 11/02/22  9:03 AM  Result Value Ref Range   Rheumatoid fact SerPl-aCnc <10 <14 IU/mL  Cyclic citrul peptide antibody, IgG ([facility])      Status: None   Collection Time: 11/02/22  9:03 AM  Result Value Ref Range   Cyclic Citrullin Peptide Ab <13 UNITS    Comment: Reference Range Negative:            <20 Weak Positive:       20-39 Moderate Positive:   40-59 Strong Positive:     >59 .   Testosterone     Status: None   Collection Time: 11/02/22  9:03 AM  Result Value Ref Range   Testosterone 362.20 300.00 - 890.00 ng/dL  VITAMIN D 25 Hydroxy (Vit-D Deficiency, Fractures)     Status: Abnormal   Collection Time: 11/02/22  9:03 AM  Result Value Ref Range   VITD 12.47 (L) 30.00 - 100.00 ng/mL  Sedimentation rate  Status: Abnormal   Collection Time: 11/02/22  9:03 AM  Result Value Ref Range   Sed Rate 29 (H) 0 - 15 mm/hr  C-reactive protein     Status: None   Collection Time: 11/02/22  9:03 AM  Result Value Ref Range   CRP <1.0 0.5 - 20.0 mg/dL  Antinuclear Antib (ANA)     Status: Abnormal   Collection Time: 11/02/22  9:03 AM  Result Value Ref Range   Anti Nuclear Antibody (ANA) POSITIVE (A) NEGATIVE    Comment: ANA IFA is a first line screen for detecting the presence of up to approximately 150 autoantibodies in various autoimmune diseases. A positive ANA IFA result is suggestive of autoimmune disease and reflexes to titer and pattern. Further laboratory testing may be considered if clinically indicated. . For additional information, please refer to http://education.[facility].com/faq/FAQ177 (This link is being provided for informational/ educational purposes only.) .   CK (Creatine Kinase)     Status: None   Collection Time: 11/02/22  9:03 AM  Result Value Ref Range   Total CK 119 7 - 232 U/L  Comp Met (CMET)     Status: None   Collection Time: 11/02/22  9:03 AM  Result Value Ref Range   Sodium 137 135 - 145 mEq/L   Potassium 4.1 3.5 - 5.1 mEq/L   Chloride 101 96 - 112 mEq/L   CO2 28 19 - 32 mEq/L   Glucose, Bld 97 70 - 99 mg/dL   BUN 10 6 - 23 mg/dL   Creatinine, Ser 4.09 0.40 - 1.50 mg/dL    Total Bilirubin 0.9 0.2 - 1.2 mg/dL   Alkaline Phosphatase 105 39 - 117 U/L   AST 37 0 - 37 U/L   ALT 20 0 - 53 U/L   Total Protein 6.8 6.0 - 8.3 g/dL   Albumin 3.6 3.5 - 5.2 g/dL   GFR 811.91 >47.82 mL/min    Comment: Calculated using the CKD-EPI Creatinine Equation (2021)   Calcium 9.3 8.4 - 10.5 mg/dL  Testosterone, Free, Total, SHBG     Status: Abnormal   Collection Time: 11/02/22  9:03 AM  Result Value Ref Range   Testosterone 649 264 - 916 ng/dL    Comment: Adult male reference interval is based on a population of healthy nonobese males (BMI <30) between 30 and 73 years old. Travison, et.al. JCEM [phone]. PMID: [id number].    Testosterone, Free 2.1 (L) 8.7 - 25.1 pg/mL   Sex Hormone Binding 139.0 (H) 16.5 - 55.9 nmol/L  CBC with Differential/Platelet     Status: Abnormal   Collection Time: 11/02/22  9:03 AM  Result Value Ref Range   WBC 5.2 4.0 - 10.5 K/uL   RBC 3.91 (L) 4.22 - 5.81 Mil/uL   Hemoglobin 12.3 (L) 13.0 - 17.0 g/dL   HCT 32.4 (L) 40.1 - 02.7 %   MCV 95.5 78.0 - 100.0 fl   MCHC 33.0 30.0 - 36.0 g/dL   RDW 25.3 66.4 - 40.3 %   Platelets 221.0 150.0 - 400.0 K/uL   Neutrophils Relative % 46.5 43.0 - 77.0 %   Lymphocytes Relative 36.5 12.0 - 46.0 %   Monocytes Relative 14.3 (H) 3.0 - 12.0 %   Eosinophils Relative 1.9 0.0 - 5.0 %   Basophils Relative 0.8 0.0 - 3.0 %   Neutro Abs 2.4 1.4 - 7.7 K/uL   Lymphs Abs 1.9 0.7 - 4.0 K/uL   Monocytes Absolute 0.7 0.1 - 1.0 K/uL   Eosinophils Absolute  0.1 0.0 - 0.7 K/uL   Basophils Absolute 0.0 0.0 - 0.1 K/uL  Anti-nuclear ab-titer (ANA titer)     Status: Abnormal   Collection Time: 11/02/22  9:03 AM  Result Value Ref Range   ANA Titer 1 1:40 (H) titer    Comment: A low level ANA titer may be present in pre-clinical autoimmune diseases and normal individuals.                 Reference Range                 <1:40        Negative                 1:40-1:80    Low Antibody Level                 >1:80         Elevated Antibody Level .    ANA Pattern 1 Mitotic, Spindle Fibers (A)     Comment: The spindle fibers between the poles are stained in mitotic cells, associated with cone-shaped decoration of the mitotic poles. The pattern is rare in Sjogren's  syndrome, systemic lupus erythematosus (SLE), and other connective tissue diseases. . AC-25: Spindle Fibers . International Consensus on ANA Patterns ([email or web address])    ANA TITER 1:40 (H) titer    Comment: A low level ANA titer may be present in pre-clinical autoimmune diseases and normal individuals.                 Reference Range                 <1:40        Negative                 1:40-1:80    Low Antibody Level                 >1:80        Elevated Antibody Level .    ANA PATTERN Cytoplasmic (A)     Comment: The presence of cytoplasmic fluorescence was noted on the HEp-2 slide. Other reactivities (e.g., anti- mitochondrial antibodies or anti-smooth muscle antibodies) may be responsible for this fluorescence. The clinical significance of this finding is uncertain. Clinical correlation is recommended. . AC-15 to AC-23: Cytoplasmic . International Consensus on ANA Patterns ([email or web address])    ANA TITER 1:40 (H) titer    Comment: A low level ANA titer may be present in pre-clinical autoimmune diseases and normal individuals.                 Reference Range                 <1:40        Negative                 1:40-1:80    Low Antibody Level                 >1:80        Elevated Antibody Level .    ANA PATTERN Nuclear, Fine Speckled (A)     Comment: Fine speckled pattern is associated with Sjogren's syndrome, systemic lupus erythematosus (SLE) and  dermatomyositis. . AC-4: Fine Speckled . International Consensus on ANA Patterns ([email or web address])     No image results found.   IR Paracentesis  Result Date: 09/03/2022 INDICATION: Patient with a  history of alcoholic cirrhosis with recurrent ascites. Interventional radiology asked to perform  a diagnostic and therapeutic paracentesis. EXAM: ULTRASOUND GUIDED PARACENTESIS MEDICATIONS: 1% lidocaine 10 mL COMPLICATIONS: None immediate. PROCEDURE: Informed written consent was obtained from the patient after a discussion of the risks, benefits and alternatives to treatment. A timeout was performed prior to the initiation of the procedure. Initial ultrasound scanning demonstrates a large amount of ascites within the right lower abdominal quadrant. The right lower abdomen was prepped and draped in the usual sterile fashion. 1% lidocaine was used for local anesthesia. Following this, a 19 gauge, 7-cm, Yueh catheter was introduced. An ultrasound image was saved for documentation purposes. The paracentesis was performed. The catheter was removed and a dressing was applied. The patient tolerated the procedure well without immediate post procedural complication. FINDINGS: A total of approximately 2.9 L of dark yellow fluid was removed. Samples were sent to the laboratory as requested by the clinical team. IMPRESSION: Successful ultrasound-guided paracentesis yielding 2.9 liters of peritoneal fluid. Read by: Alwyn Ren, NP PLAN: The patient has required >/=2 paracenteses in a 30 day period and a formal evaluation by the [facility] Interventional Radiology Portal Hypertension Clinic has been arranged. Electronically Signed   By: Malachy Moan M.D.   On: 09/03/2022 15:36   US Paracentesis  Result Date: 08/23/2022 INDICATION: Patient with history of alcoholic cirrhosis, substance abuse, right hepatic lobe mass, ascites. Request received for diagnostic and therapeutic paracentesis up to 2 liters. EXAM: ULTRASOUND GUIDED DIAGNOSTIC AND THERAPEUTIC PARACENTESIS MEDICATIONS: 10 mL 1% lidocaine COMPLICATIONS: None immediate. PROCEDURE: Informed written consent was obtained from the patient after a discussion of the  risks, benefits and alternatives to treatment. A timeout was performed prior to the initiation of the procedure. Initial ultrasound scanning demonstrates a moderate amount of ascites within the right lower abdominal quadrant. The right lower abdomen was prepped and draped in the usual sterile fashion. 1% lidocaine was used for local anesthesia. Following this, a 19 gauge, 7-cm, Yueh catheter was introduced. An ultrasound image was saved for documentation purposes. The paracentesis was performed. The catheter was removed and a dressing was applied. The patient tolerated the procedure well without immediate post procedural complication. FINDINGS: A total of approximately 2 liters of yellow fluid was removed. Samples were sent to the laboratory as requested by the clinical team. IMPRESSION: Successful ultrasound-guided diagnostic and therapeutic paracentesis yielding 2 liters of peritoneal fluid. This is the patient's first paracentesis. Read by: Jeananne Rama, PA-C PLAN: If the patient eventually requires >/=2 paracenteses in a 30 day period, candidacy for formal evaluation by the [facility] Interventional Radiology Portal Hypertension Clinic will be assessed. Roanna Banning, MD [facility] Radiology Electronically Signed   By: Roanna Banning M.D.   On: 08/23/2022 13:02   MR ABDOMEN MRCP W WO CONTAST  Result Date: 08/22/2022 CLINICAL DATA:  Cirrhosis.  Possible hepatic mass on recent CT. EXAM: MRI ABDOMEN WITHOUT AND WITH CONTRAST (INCLUDING MRCP) TECHNIQUE: Multiplanar multisequence MR imaging of the abdomen was performed both before and after the administration of intravenous contrast. Heavily T2-weighted images of the biliary and pancreatic ducts were obtained, and three-dimensional MRCP images were rendered by post processing. CONTRAST:  8.31mL GADAVIST GADOBUTROL 1 MMOL/ML IV SOLN COMPARISON:  CT on 07/31/2022 FINDINGS: Lower chest: No acute findings. Hepatobiliary:  Hepatic cirrhosis is demonstrated. Recanalization of paraumbilical veins is seen, consistent with portal venous hypertension. Geographic pattern of hepatic steatosis is seen which predominantly involves the left hepatic lobe. No hepatic masses are identified. The portal and hepatic veins are patent. Gallbladder is unremarkable.  No evidence of biliary ductal dilatation or choledocholithiasis. Pancreas:  No mass or inflammatory changes. Spleen:  Within normal limits in size and appearance. Adrenals/Urinary Tract: No suspicious masses identified. No evidence of hydronephrosis. Stomach/Bowel: Unremarkable. Vascular/Lymphatic: No pathologically enlarged lymph nodes identified. No acute vascular findings. Upper abdominal portosystemic venous collaterals are seen, consistent with portal venous hypertension. Other:  Diffuse mesenteric edema and moderate ascites is seen. Musculoskeletal:  No suspicious bone lesions identified. IMPRESSION: Hepatic cirrhosis and findings of portal venous hypertension. Geographic pattern of hepatic steatosis predominantly involving the left hepatic lobe. No evidence of hepatic neoplasm. Moderate ascites and diffuse mesenteric edema. Electronically Signed   By: Danae Orleans M.D.   On: 08/22/2022 17:03   MR 3D Recon At Scanner  Result Date: 08/22/2022 CLINICAL DATA:  Cirrhosis.  Possible hepatic mass on recent CT. EXAM: MRI ABDOMEN WITHOUT AND WITH CONTRAST (INCLUDING MRCP) TECHNIQUE: Multiplanar multisequence MR imaging of the abdomen was performed both before and after the administration of intravenous contrast. Heavily T2-weighted images of the biliary and pancreatic ducts were obtained, and three-dimensional MRCP images were rendered by post processing. CONTRAST:  8.68mL GADAVIST GADOBUTROL 1 MMOL/ML IV SOLN COMPARISON:  CT on 07/31/2022 FINDINGS: Lower chest: No acute findings. Hepatobiliary: Hepatic cirrhosis is demonstrated. Recanalization of paraumbilical veins is seen, consistent with  portal venous hypertension. Geographic pattern of hepatic steatosis is seen which predominantly involves the left hepatic lobe. No hepatic masses are identified. The portal and hepatic veins are patent. Gallbladder is unremarkable. No evidence of biliary ductal dilatation or choledocholithiasis. Pancreas:  No mass or inflammatory changes. Spleen:  Within normal limits in size and appearance. Adrenals/Urinary Tract: No suspicious masses identified. No evidence of hydronephrosis. Stomach/Bowel: Unremarkable. Vascular/Lymphatic: No pathologically enlarged lymph nodes identified. No acute vascular findings. Upper abdominal portosystemic venous collaterals are seen, consistent with portal venous hypertension. Other:  Diffuse mesenteric edema and moderate ascites is seen. Musculoskeletal:  No suspicious bone lesions identified. IMPRESSION: Hepatic cirrhosis and findings of portal venous hypertension. Geographic pattern of hepatic steatosis predominantly involving the left hepatic lobe. No evidence of hepatic neoplasm. Moderate ascites and diffuse mesenteric edema. Electronically Signed   By: Danae Orleans M.D.   On: 08/22/2022 17:03   US Abdomen Limited RUQ (LIVER/GB)  Result Date: 08/21/2022 CLINICAL DATA:  Elevated LFTs. EXAM: ULTRASOUND ABDOMEN LIMITED RIGHT UPPER QUADRANT COMPARISON:  Hepatic elastography 08/18/2022, 3 days prior. CT 07/31/2022 FINDINGS: Gallbladder: Distended. No gallstones or wall thickening visualized. No sonographic Murphy sign noted by sonographer. Common bile duct: Obscured and not demonstrated on the current exam. There is no intrahepatic biliary ductal dilatation. Liver: Diffusely increased and coarsened hepatic parenchymal echogenicity. Liver parenchyma is difficult to penetrate. There is subtle capsular nodularity. No definite focal lesion, particularly no lesion corresponding to that questioned on recent CT. Flow within the main portal vein was difficult to demonstrated on the current  exam, was recently demonstrated to be reversed. There may be slow flow within the main portal vein, however difficult to accurately characterize. To and fro flow may be seen on cine clip. Other: Moderate ascites. IMPRESSION: 1. Findings consistent with hepatic cirrhosis. There is no focal lesion, although assessment is technically limited. Hypodense lesion was questioned on CT earlier today. Consider further assessment with MRI. 2. Nonspecific gallbladder distension. No evidence of acute cholecystitis. 3. 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 Slow flow that is to and fro was potentially seen. Recent hepatic elastography demonstrated reversal of  flow in the main portal vein 3 days ago. Recommend further assessment with contrast-enhanced CT or MRI. 4. Moderate ascites. Electronically Signed   By: Narda Rutherford M.D.   On: 08/21/2022 18:43   [place] ELASTOGRAPHY LIVER  Result Date: 08/18/2022 CLINICAL DATA:  Alcohol use EXAM: [place] ELASTOGRAPHY HEPATIC TECHNIQUE: Sonography of the liver was performed. In addition, ultrasound elastography evaluation of the liver was performed. A region of interest was placed within the right lobe of the liver. Following application of a compressive sonographic pulse, tissue compressibility was assessed. Multiple assessments were performed at the selected site. Median tissue compressibility was determined. Previously, hepatic stiffness was assessed by shear wave velocity. Based on recently published Society of Radiologists in Ultrasound consensus article, reporting is now recommended to be performed in the SI units of pressure (kiloPascals) representing hepatic stiffness/elasticity. The obtained result is compared to the published reference standards. (cACLD = compensated Advanced Chronic Liver Disease) COMPARISON:  None Available. FINDINGS: Liver: Severely increased echotexture throughout the liver compatible with  fatty infiltration and/or intrinsic liver disease. Mildly nodular contours compatible with cirrhosis. Reversal of flow in the main portal vein. ULTRASOUND HEPATIC ELASTOGRAPHY Device: Siemens Helix VTQ Patient position: Supine Transducer: 9C2 Number of measurements: 10 Hepatic segment:  8 Median kPa: 36.6 IQR: 56.9 IQR/Median kPa ratio: 1.6 Data quality: IQR/Median kPa ratio of 0.3 or greater indicates reduced accuracy Diagnostic category: > or =17 kPa: highly suggestive of cACLD with an increased probability of clinically significant portal hypertension The use of hepatic elastography is applicable to patients with viral hepatitis and non-alcoholic fatty liver disease. At this time, there is insufficient data for the referenced cut-off values and use in other causes of liver disease, including alcoholic liver disease. Patients, however, may be assessed by elastography and serve as their own reference standard/baseline. In patients with non-alcoholic liver disease, the values suggesting compensated advanced chronic liver disease (cACLD) may be lower, and patients may need additional testing with elasticity results of 7-9 kPa. Please note that abnormal hepatic elasticity and shear wave velocities may also be identified in clinical settings other than with hepatic fibrosis, such as: acute hepatitis, elevated right heart and central venous pressures including use of beta blockers, veno-occlusive disease (Budd-Chiari), infiltrative processes such as mastocytosis/amyloidosis/infiltrative tumor/lymphoma, extrahepatic cholestasis, with hyperemia in the post-prandial state, and with liver transplantation. Correlation with patient history, laboratory data, and clinical condition recommended. Diagnostic Categories: < or =5 kPa: high probability of being normal < or =9 kPa: in the absence of other known clinical signs, rules out cACLD >9 kPa and ?13 kPa: suggestive of cACLD, but needs further testing >13 kPa: highly suggestive  of cACLD > or =17 kPa: highly suggestive of cACLD with an increased probability of clinically significant portal hypertension IMPRESSION: ULTRASOUND LIVER: Severe diffuse fatty infiltration of the liver with changes of cirrhosis. Reversal of flow in the main portal vein. ULTRASOUND HEPATIC ELASTOGRAPHY: Median kPa:  36.6 Diagnostic category: > or =17 kPa: highly suggestive of cACLD with an increased probability of clinically significant portal hypertension Electronically Signed   By: Charlett Nose M.D.   On: 08/18/2022 14:54     --------------------------------    Signed: Lula Olszewski, MD 11/06/2022 6:20 PM

## 2022-11-02 NOTE — Assessment & Plan Note (Addendum)
Well-controlled now sober over 2 months - encouraged him to start track days using I am sober app. He installed it at my request. Encouraged to maintain abstinence  Encouraged patient to start a 12 step- he is religious- strongly encouraged alcohol anon   Following a detailed discussion about antabuse and its potential benefits and risks, the patient expressed a preference to monitor the situation for now.  He acknowledged the potential for serious health consequences of delaying this intervention.  In line with his preferences, we will continue to monitor the situation and discuss again as at the next visit or sooner if his condition changes.  I encouraged him to keep me updated on any developments.  He agreed to take some info in AVS.  He prefers to stay on gabapentin/acamprosate current dose

## 2022-11-04 LAB — ANTI-NUCLEAR AB-TITER (ANA TITER)
ANA TITER: 1:40 {titer} — ABNORMAL HIGH
ANA TITER: 1:40 {titer} — ABNORMAL HIGH
ANA Titer 1: 1:40 {titer} — ABNORMAL HIGH

## 2022-11-04 LAB — CYCLIC CITRUL PEPTIDE ANTIBODY, IGG: Cyclic Citrullin Peptide Ab: <16 UNITS

## 2022-11-04 LAB — RHEUMATOID FACTOR: Rheumatoid fact SerPl-aCnc: <10 IU/mL (ref <14)

## 2022-11-04 LAB — ANA: Anti Nuclear Antibody (ANA): POSITIVE — AB

## 2022-11-06 ENCOUNTER — Encounter: Payer: Self-pay | Admitting: Internal Medicine

## 2022-11-06 DIAGNOSIS — R7989 Other specified abnormal findings of blood chemistry: Secondary | ICD-10-CM | POA: Insufficient documentation

## 2022-11-06 DIAGNOSIS — R768 Other specified abnormal immunological findings in serum: Secondary | ICD-10-CM | POA: Insufficient documentation

## 2022-11-06 LAB — TESTOSTERONE, FREE, TOTAL, SHBG
Sex Hormone Binding: 139 nmol/L — ABNORMAL HIGH (ref 16.5–55.9)
Testosterone, Free: 2.1 pg/mL — ABNORMAL LOW (ref 8.7–25.1)
Testosterone: 649 ng/dL (ref 264–916)

## 2022-11-06 NOTE — Assessment & Plan Note (Signed)
Addendum added this problem 11/06/22 Results showed low free testosterone and high shbg... presumably liver induced secondary hypogonadism.  Offered referral to endocrinology

## 2022-11-06 NOTE — Assessment & Plan Note (Signed)
This note was addended to add this problem information to the problem overview on 11/06/22, to facilitate referrals to rheumatology that will result from the labs ordered. See problem overview for lab results assessments.

## 2022-11-06 NOTE — Progress Notes (Signed)
Please place any referral orders needed / requested from mychart comment Arrange/confirm follow up appt(s).  Share recent lab findings with his gastroenterologist at [facility] gastroenterology.   I'm updating/addending my note to make these referral go better but also to share with his gastrointestinal doctor

## 2022-11-08 ENCOUNTER — Other Ambulatory Visit: Payer: Self-pay

## 2022-11-08 DIAGNOSIS — R7989 Other specified abnormal findings of blood chemistry: Secondary | ICD-10-CM

## 2022-11-08 DIAGNOSIS — R768 Other specified abnormal immunological findings in serum: Secondary | ICD-10-CM

## 2022-11-08 DIAGNOSIS — M1990 Unspecified osteoarthritis, unspecified site: Secondary | ICD-10-CM

## 2022-11-08 DIAGNOSIS — R5383 Other fatigue: Secondary | ICD-10-CM

## 2022-11-08 DIAGNOSIS — K7030 Alcoholic cirrhosis of liver without ascites: Secondary | ICD-10-CM

## 2022-11-08 DIAGNOSIS — N529 Male erectile dysfunction, unspecified: Secondary | ICD-10-CM

## 2022-11-08 DIAGNOSIS — E538 Deficiency of other specified B group vitamins: Secondary | ICD-10-CM

## 2022-11-12 ENCOUNTER — Encounter (HOSPITAL_COMMUNITY): Payer: Self-pay | Admitting: Internal Medicine

## 2022-11-15 NOTE — Progress Notes (Signed)
Attempted to obtain medical history via telephone, unable to reach at this time. Unable to leave voicemail to return pre surgical testing department's phone call,due to mailbox full.  

## 2022-11-21 NOTE — Anesthesia Preprocedure Evaluation (Signed)
Anesthesia Evaluation  Patient identified by MRN, date of birth, ID band Patient awake    Reviewed: Allergy & Precautions, NPO status , Patient's Chart, lab work & pertinent test results  History of Anesthesia Complications Negative for: history of anesthetic complications  Airway Mallampati: III  TM Distance: >3 FB Neck ROM: Full    Dental  (+) Poor Dentition, Dental Advisory Given,    Pulmonary neg shortness of breath, neg sleep apnea, neg COPD, neg recent URI, former smoker   Pulmonary exam normal breath sounds clear to auscultation       Cardiovascular negative cardio ROS  Rhythm:Regular Rate:Normal     Neuro/Psych  PSYCHIATRIC DISORDERS (panic attack) Anxiety Depression    negative neurological ROS     GI/Hepatic negative GI ROS,,,(+) Cirrhosis  (secondary to alcohol, portal HTN)  Esophageal Varices  substance abuse  alcohol use  Endo/Other  negative endocrine ROS    Renal/GU negative Renal ROS     Musculoskeletal  (+) Arthritis ,    Abdominal   Peds  Hematology  (+) Blood dyscrasia, anemia   Anesthesia Other Findings   Reproductive/Obstetrics                             Anesthesia Physical Anesthesia Plan  ASA: 3  Anesthesia Plan: MAC   Post-op Pain Management:    Induction: Intravenous  PONV Risk Score and Plan: 1 and Propofol infusion and Treatment may vary due to age or medical condition  Airway Management Planned: Natural Airway and Nasal Cannula  Additional Equipment:   Intra-op Plan:   Post-operative Plan:   Informed Consent: I have reviewed the patients History and Physical, chart, labs and discussed the procedure including the risks, benefits and alternatives for the proposed anesthesia with the patient or authorized representative who has indicated his/her understanding and acceptance.     Dental advisory given  Plan Discussed with: CRNA and  Anesthesiologist  Anesthesia Plan Comments: (Discussed with patient risks of MAC including, but not limited to, minor pain or discomfort, hearing people in the room, and possible need for backup general anesthesia. Risks for general anesthesia also discussed including, but not limited to, sore throat, hoarse voice, chipped/damaged teeth, injury to vocal cords, nausea and vomiting, allergic reactions, lung infection, heart attack, stroke, and death. All questions answered. )       Anesthesia Quick Evaluation

## 2022-11-23 ENCOUNTER — Encounter (HOSPITAL_COMMUNITY)
Admission: RE | Disposition: A | Discharge status: Home / Self Care | Payer: Self-pay | Source: Home / Self Care | Attending: Internal Medicine

## 2022-11-23 ENCOUNTER — Ambulatory Visit (HOSPITAL_COMMUNITY): Payer: BC Managed Care – PPO | Admitting: Anesthesiology

## 2022-11-23 ENCOUNTER — Other Ambulatory Visit: Payer: Self-pay

## 2022-11-23 ENCOUNTER — Encounter (HOSPITAL_COMMUNITY): Payer: Self-pay | Admitting: Internal Medicine

## 2022-11-23 ENCOUNTER — Ambulatory Visit (HOSPITAL_COMMUNITY)
Admission: RE | Admit: 2022-11-23 | Discharge: 2022-11-23 | Disposition: A | Discharge status: Home / Self Care | Payer: BC Managed Care – PPO | Attending: Internal Medicine | Admitting: Internal Medicine

## 2022-11-23 DIAGNOSIS — Z87891 Personal history of nicotine dependence: Secondary | ICD-10-CM | POA: Diagnosis not present

## 2022-11-23 DIAGNOSIS — K766 Portal hypertension: Secondary | ICD-10-CM | POA: Insufficient documentation

## 2022-11-23 DIAGNOSIS — K319 Disease of stomach and duodenum, unspecified: Secondary | ICD-10-CM | POA: Diagnosis not present

## 2022-11-23 DIAGNOSIS — K7682 Hepatic encephalopathy: Secondary | ICD-10-CM | POA: Diagnosis not present

## 2022-11-23 DIAGNOSIS — K2970 Gastritis, unspecified, without bleeding: Secondary | ICD-10-CM | POA: Insufficient documentation

## 2022-11-23 DIAGNOSIS — K7031 Alcoholic cirrhosis of liver with ascites: Secondary | ICD-10-CM | POA: Insufficient documentation

## 2022-11-23 DIAGNOSIS — K3189 Other diseases of stomach and duodenum: Secondary | ICD-10-CM | POA: Insufficient documentation

## 2022-11-23 DIAGNOSIS — K7460 Unspecified cirrhosis of liver: Secondary | ICD-10-CM | POA: Diagnosis not present

## 2022-11-23 DIAGNOSIS — F418 Other specified anxiety disorders: Secondary | ICD-10-CM | POA: Diagnosis not present

## 2022-11-23 HISTORY — PX: BIOPSY: SHX5522

## 2022-11-23 HISTORY — PX: ESOPHAGOGASTRODUODENOSCOPY (EGD) WITH PROPOFOL: SHX5813

## 2022-11-23 SURGERY — ESOPHAGOGASTRODUODENOSCOPY (EGD) WITH PROPOFOL
Anesthesia: Monitor Anesthesia Care

## 2022-11-23 MED ORDER — PROPOFOL 1000 MG/100ML IV EMUL
INTRAVENOUS | Status: AC
  Filled 2022-11-23: qty 100

## 2022-11-23 MED ORDER — PROPOFOL 500 MG/50ML IV EMUL
INTRAVENOUS | Status: DC | PRN
  Administered 2022-11-23: 125 ug/kg/min via INTRAVENOUS

## 2022-11-23 MED ORDER — PROPOFOL 500 MG/50ML IV EMUL
INTRAVENOUS | Status: AC
  Filled 2022-11-23: qty 50

## 2022-11-23 MED ORDER — LIDOCAINE 2% (20 MG/ML) 5 ML SYRINGE
INTRAMUSCULAR | Status: DC | PRN
  Administered 2022-11-23: 100 mg via INTRAVENOUS

## 2022-11-23 MED ORDER — LACTATED RINGERS IV SOLN: INTRAVENOUS | Status: DC | PRN

## 2022-11-23 MED ORDER — PROPOFOL 10 MG/ML IV BOLUS
INTRAVENOUS | Status: DC | PRN
  Administered 2022-11-23 (×4): 20 mg via INTRAVENOUS

## 2022-11-23 SURGICAL SUPPLY — 15 items

## 2022-11-23 NOTE — Discharge Instructions (Signed)
YOU HAD AN ENDOSCOPIC PROCEDURE TODAY: Refer to the procedure report and other information in the discharge instructions given to you for any specific questions about what was found during the examination. If this information does not answer your questions, please call [place] office at [phone] to clarify.  ° °YOU SHOULD EXPECT: Some feelings of bloating in the abdomen. Passage of more gas than usual. Walking can help get rid of the air that was put into your GI tract during the procedure and reduce the bloating. If you had a lower endoscopy (such as a colonoscopy or flexible sigmoidoscopy) you may notice spotting of blood in your stool or on the toilet paper. Some abdominal soreness may be present for a day or two, also. ° °DIET: Your first meal following the procedure should be a light meal and then it is ok to progress to your normal diet. A half-sandwich or bowl of soup is an example of a good first meal. Heavy or fried foods are harder to digest and may make you feel nauseous or bloated. Drink plenty of fluids but you should avoid alcoholic beverages for 24 hours. If you had a esophageal dilation, please see attached instructions for diet.   ° °ACTIVITY: Your care partner should take you home directly after the procedure. You should plan to take it easy, moving slowly for the rest of the day. You can resume normal activity the day after the procedure however YOU SHOULD NOT DRIVE, use power tools, machinery or perform tasks that involve climbing or major physical exertion for 24 hours (because of the sedation medicines used during the test).  ° °SYMPTOMS TO REPORT IMMEDIATELY: °A gastroenterologist can be reached at any hour. Please call [phone]  for any of the following symptoms:  °Following lower endoscopy (colonoscopy, flexible sigmoidoscopy) °Excessive amounts of blood in the stool  °Significant tenderness, worsening of abdominal pains  °Swelling of the abdomen that is new, acute  °Fever of 100° or  higher  °Following upper endoscopy (EGD, EUS, ERCP, esophageal dilation) °Vomiting of blood or coffee ground material  °New, significant abdominal pain  °New, significant chest pain or pain under the shoulder blades  °Painful or persistently difficult swallowing  °New shortness of breath  °Black, tarry-looking or red, bloody stools ° °FOLLOW UP:  °If any biopsies were taken you will be contacted by phone or by letter within the next 1-3 weeks. Call [phone]  if you have not heard about the biopsies in 3 weeks.  °Please also call with any specific questions about appointments or follow up tests. ° °

## 2022-11-23 NOTE — Transfer of Care (Signed)
Immediate Anesthesia Transfer of Care Note  Patient: John Maxwell  Procedure(s) Performed: ESOPHAGOGASTRODUODENOSCOPY (EGD) WITH PROPOFOL BIOPSY  Patient Location: Endoscopy Unit  Anesthesia Type:MAC  Level of Consciousness: awake and alert   Airway & Oxygen Therapy: Patient Spontanous Breathing and Patient connected to face mask oxygen  Post-op Assessment: Report given to RN and Post -op Vital signs reviewed and stable  Post vital signs: Reviewed and stable  Last Vitals:  Vitals Value Taken Time  BP 119/75 11/23/22 0923  Temp    Pulse 70 11/23/22 0925  Resp 14 11/23/22 0925  SpO2 100 % 11/23/22 0925  Vitals shown include unvalidated device data.  Last Pain:  Vitals:   11/23/22 0700  TempSrc: Temporal  PainSc: 0-No pain         Complications: No notable events documented.

## 2022-11-23 NOTE — Anesthesia Postprocedure Evaluation (Signed)
Anesthesia Post Note  Patient: Engineer, structural  Procedure(s) Performed: ESOPHAGOGASTRODUODENOSCOPY (EGD) WITH PROPOFOL BIOPSY     Patient location during evaluation: PACU Anesthesia Type: MAC Level of consciousness: awake Pain management: pain level controlled Vital Signs Assessment: post-procedure vital signs reviewed and stable Respiratory status: spontaneous breathing, nonlabored ventilation and respiratory function stable Cardiovascular status: stable and blood pressure returned to baseline Postop Assessment: no apparent nausea or vomiting Anesthetic complications: no   No notable events documented.  Last Vitals:  Vitals:   11/23/22 0930 11/23/22 0940  BP: (!) 104/58 117/74  Pulse: 78 67  Resp: (!) 22 16  Temp:    SpO2: 100% 100%    Last Pain:  Vitals:   11/23/22 0943  TempSrc:   PainSc: 0-No pain                 Linton Rump

## 2022-11-23 NOTE — H&P (Signed)
[facility] H&P  Subjective: John Maxwell is a 42 y.o. male who presents for EGD for variceal screening. 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 Last paracentesis 09/03/2022 removed 2.9 L. Last seen by me in office 10/07/22, at that time was on furosemide and spironolactone as well as lactulose. Today, noted that he ran out of diuretics, is currently not taking any. He has been referred to [facility] (Dr. Audrie Lia) and is pending this appointment in June 2024.  He denied alcohol use.   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

## 2022-11-23 NOTE — Op Note (Signed)
[facility] Patient Name: John Maxwell Procedure Date: 11/23/2022 MRN: [id number] Attending MD: Liliane Shi DO, DO, [id number] Date of Birth: 08/19/1980 CSN: [id number] Age: 42 Admit Type: Outpatient Procedure:                Upper GI endoscopy Indications:              Cirrhosis rule out esophageal varices Providers:                Liliane Shi DO, DO, Marge Duncans, RN,                            Martha Clan, RN, Priscella Mann, Technician Referring MD:              Medicines:                See the Anesthesia note for documentation of the                            administered medications Complications:            No immediate complications. Estimated Blood Loss:     Estimated blood loss was minimal. Procedure:                Pre-Anesthesia Assessment:                           - ASA Grade Assessment: III - A patient with severe                            systemic disease.                           - The risks and benefits of the procedure and the                            sedation options and risks were discussed with the                            patient. All questions were answered and informed                            consent was obtained.                           After obtaining informed consent, the endoscope was                            passed under direct vision. Throughout the                            procedure, the patient's blood pressure, pulse, and                            oxygen saturations were monitored continuously. The                            [id number]-[id number] ([id number])  Olympus endoscope was introduced                            through the mouth, and advanced to the second part                            of duodenum. The upper GI endoscopy was                            accomplished without difficulty. The patient                            tolerated the procedure well. Scope In: Scope Out: Findings:      The examined  esophagus was normal. No esophageal varices were seen.      The Z-line was regular and was found 42 cm from the incisors.      Portal hypertensive gastropathy was found in the entire examined       stomach. No gastric varices were seen.      Scattered mild inflammation characterized by erythema and old blood was       found in the gastric antrum. Biopsies were taken with a cold forceps for       Helicobacter pylori testing.      The examined duodenum was normal. Impression:               - Normal esophagus.                           - Z-line regular, 42 cm from the incisors.                           - Portal hypertensive gastropathy.                           - Gastritis. Biopsied.                           - Normal examined duodenum. Moderate Sedation:      See the other procedure note for documentation of moderate sedation with       intraservice time. Recommendation:           - Discharge patient to home.                           - Resume previous diet.                           - Continue present medications.                           - Await pathology results.                           - Repeat upper endoscopy in 3 years for screening                            purposes.                           -  Return to GI office in 2 months.                           - Follow up with [facility] in June 2024. Procedure Code(s):        --- Professional ---                           [phone], Esophagogastroduodenoscopy, flexible,                            transoral; with biopsy, single or multiple Diagnosis Code(s):        --- Professional ---                           K76.6, Portal hypertension                           K31.89, Other diseases of stomach and duodenum                           K29.70, Gastritis, unspecified, without bleeding                           K74.60, Unspecified cirrhosis of liver CPT copyright 2022 [employer]. All rights reserved. The codes  documented in this report are preliminary and upon coder review may  be revised to meet current compliance requirements. Dr Liliane Shi, DO Liliane Shi DO, DO 11/23/2022 9:27:02 AM Number of Addenda: 0

## 2022-11-24 ENCOUNTER — Encounter: Payer: Self-pay | Admitting: Internal Medicine

## 2022-11-24 ENCOUNTER — Ambulatory Visit (INDEPENDENT_AMBULATORY_CARE_PROVIDER_SITE_OTHER): Payer: BC Managed Care – PPO | Admitting: Internal Medicine

## 2022-11-24 VITALS — BP 106/68 | HR 80 | Temp 98.7°F | Ht 67.0 in | Wt 162.0 lb

## 2022-11-24 DIAGNOSIS — M1990 Unspecified osteoarthritis, unspecified site: Secondary | ICD-10-CM

## 2022-11-24 DIAGNOSIS — K766 Portal hypertension: Secondary | ICD-10-CM | POA: Diagnosis not present

## 2022-11-24 DIAGNOSIS — K3189 Other diseases of stomach and duodenum: Secondary | ICD-10-CM | POA: Insufficient documentation

## 2022-11-24 DIAGNOSIS — F1090 Alcohol use, unspecified, uncomplicated: Secondary | ICD-10-CM | POA: Diagnosis not present

## 2022-11-24 DIAGNOSIS — R21 Rash and other nonspecific skin eruption: Secondary | ICD-10-CM | POA: Diagnosis not present

## 2022-11-24 DIAGNOSIS — R61 Generalized hyperhidrosis: Secondary | ICD-10-CM | POA: Diagnosis not present

## 2022-11-24 DIAGNOSIS — R7989 Other specified abnormal findings of blood chemistry: Secondary | ICD-10-CM

## 2022-11-24 DIAGNOSIS — M064 Inflammatory polyarthropathy: Secondary | ICD-10-CM

## 2022-11-24 LAB — SURGICAL PATHOLOGY

## 2022-11-24 MED ORDER — CELECOXIB 100 MG PO CAPS
100.0000 mg | ORAL_CAPSULE | Freq: Two times a day (BID) | ORAL | 1 refills | Status: DC
Start: 2022-11-24 — End: 2022-12-14

## 2022-11-24 NOTE — Assessment & Plan Note (Signed)
Unexplained Facial Rash: New onset rash on face and upper back. No clear cause identified. -Referral to Dermatology for further evaluation.

## 2022-11-24 NOTE — Assessment & Plan Note (Signed)
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 This is likely a consequence of liver injury and may improve on its own. -Referral to Endocrinology for further evaluation due to complexity of liver failure elevated SHBG and erectile dysfunction (ED) .

## 2022-11-24 NOTE — Progress Notes (Signed)
[facility]: [phone]   Routine Medical Office Visit  Patient:  John Maxwell      Age: 42 y.o.       Sex:  male  Date:   11/24/2022 PCP:    Lula Olszewski, MD   Today's Healthcare Provider: Lula Olszewski, MD   Assessment and Plan:   Alcohol use disorder Assessment & Plan: Alcohol Use Disorder in Remission: Patient reports continued sobriety. Currently on Camprol and Gabapentin to minimize cravings. Encouraged continued participation in online AA meetings and obtaining a sponsor. -Continue Camprol and Gabapentin. -Consider reducing these medications once a sponsor is in place and patient is stable in sobriety.   Arthritis Assessment & Plan: Generalized Arthritis of Unknown Source: Worsening joint pain in elbows, shoulders, knees, hands, and ankles. No visible inflammation. Lab work showed elevated sed rate indicating inflammation. ANA test was technically positive for lupus but likely a false positive. Rheumatology referral already in place. -Start Celebrex 100mg  daily for arthritis pain, with potential to increase dose if well-tolerated and not providing adequate pain relief. -Schedule follow-up in 1-2 weeks to assess response to Celebrex.   Orders: -     Celecoxib; Take 1 capsule (100 mg total) by mouth 2 (two) times daily.  Dispense: 180 capsule; Refill: 1  Portal hypertension (HCC)  Rash Assessment & Plan: Unexplained Facial Rash: New onset rash on face and upper back. No clear cause identified. -Referral to Dermatology for further evaluation.   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 No visible inflammation. Lab work showed elevated sed rate indicating inflammation. ANA test was technically positive for lupus but likely a false positive. Rheumatology referral already in place. -Start Celebrex 100mg  daily for arthritis pain, with potential to increase dose if well-tolerated and not providing adequate pain relief. -Schedule follow-up in 1-2 weeks to assess response to Celebrex.    Low testosterone in male Assessment & Plan: Low Testosterone: Lab work showed normal total testosterone but low free testosterone likely due to high Sex Hormone Binding Globulin. This is likely a consequence of liver injury and may improve on its own. -Referral to Endocrinology for further evaluation due to complexity of liver failure elevated SHBG and erectile dysfunction (ED) .    Assessment and Plan - assessment and plan as extracted by artificial intelligence conversational analysis:    Generalized Arthritis of Unknown Source: Worsening joint pain in elbows, shoulders, knees, hands, and ankles. No visible inflammation. Lab work showed elevated sed rate indicating inflammation. ANA test was technically positive for lupus but likely a false positive. Rheumatology referral already in place. -Start Celebrex 100mg  daily for arthritis pain, with potential to increase dose if well-tolerated and not providing adequate pain relief. -Schedule follow-up in 1-2 weeks to assess response to Celebrex.  Alcohol Use Disorder in Remission: Patient reports continued sobriety. Currently on Camprol and Gabapentin to minimize cravings. Encouraged continued participation in online AA meetings and obtaining a sponsor. -Continue Camprol and Gabapentin. -Consider reducing these medications once a sponsor is in place and patient is stable in sobriety.  Unexplained Facial Rash: New onset rash on face and upper back. No clear cause  identified. -Referral to Dermatology for further  evaluation.  Night Sweats: Patient reports waking up covered in sweat. Possible alcohol withdrawal phenomenon or related to joint issues. -Perform Tuberculosis test today and patient to return in 2-3 days for reading.  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 This is likely a consequence of liver injury and may improve on its own. -Referral to Endocrinology for further evaluation.     Treatment plan discussed and reviewed in detail. Explained medication safety and potential side effects.  Answered all patient questions and confirmed understanding and comfort with the plan. Encouraged patient to contact our office if they have any questions or concerns.       Clinical Presentation:    42 y.o. male here today for 3 week follow-up, generalized arthritis, and alcohol use disorder (Around 3 months sober)  HPI History of Present Illness The patient, a 42 year old with a history of alcohol use disorder currently in remission, presented for a three-week follow-up. The primary concern was worsening joint pain, described as a pervasive, inflammatory arthritis of unknown origin. The pain, which began approximately one to two months ago, is located in the elbows, shoulders, knees, hands, and ankles. The patient reported that the pain is constant throughout the day and has been progressively worsening. The pain is so severe that it affects the patient's mobility, requiring them to brace themselves when standing up from a kneeling position due to the fear of falling.  The patient has been sober for approximately three months and is actively using the "I Am Sober" app. They have not been attending [facility] meetings due to work schedule conflicts but expressed interest in online meetings. The patient is currently taking gabapentin and Camprol to minimize cravings for alcohol.  In addition to joint  pain, the patient reported a new rash on their face, chest, and upper back that appeared about a month ago. The patient also reported experiencing intense sweating upon waking up for the past week. The patient is currently taking a multivitamin and has an upcoming appointment with a rheumatologist to further investigate the joint pain. The patient had an endoscopy recently, which revealed a little inflammation in the stomach but no varices. The patient has not been taking any over-the-counter pain medications for the joint pain.  The following table summarizes the actions taken during the encounter to manage (by editing,updating, adding,and resolving)  Problem  Reactive Gastropathy   Per 10/2022 stomach biopsy:  A. STOMACH, BIOPSY:       Gastric antral mucosa with reactive gastropathy.       Gastric oxyntic mucosa with no significant diagnostic alteration.       No H. pylori identified on HE.       Negative for intestinal metaplasia or dysplasia.     Rash   Nov 24, 2022 interim history:   In addition to joint pain, the patient reported a new rash on their face, chest, and upper back that appeared about a month ago.    Night Sweats   Nov 24, 2022 interim history:   The patient also reported experiencing intense sweating upon waking up for the past week.   Low Testosterone in Male  Undifferentiated Inflammatory Arthritis (Hcc)   Nov 24, 2022 interim history:   worsening, am sweats. The primary concern was worsening joint pain, described as a pervasive, inflammatory arthritis of unknown origin. The pain, which began approximately one to  two months ago, is located in the elbows, shoulders, knees, hands, and ankles. The patient reported that the pain is constant throughout the day and has been progressively worsening. The pain is so severe that it affects the patient's mobility, requiring them to brace themselves when standing up from a kneeling position due to the fear of falling. Has yet to take any  pain medications.   My clinical judgment, informed by a comprehensive review of all available data (including the AI analysis from [facility]) guides the development of the optimal treatment plan for the patient. While AI insights provide valuable information, they are always interpreted within the context of the patient's unique medical history, presenting complaints, and physical examination findings. Any deviations from AI-generated suggestions are meticulously documented and clearly explained to ensure transparency and optimal patient care. This approach underscores both the patient's well-being and my professional responsibility as their clinician.   AI-Generated Clinical Decision Support Report from [facility] (conducted on 11/02/2022 using de-identified patient data)  Comprehensive Review of the Case: A 42 year old male with a complex medical history including alcohol use disorder, panic attack, alcoholic cirrhosis of the liver, acute and chronic liver failure, hyponatremia, drug-induced hypokalemia, anemia, thrombocytopenia, folate deficiency, hepatic encephalopathy, portal hypertension, alcoholic hepatitis with ascites, restless legs syndrome, elevated bilirubin, and electrolyte abnormality presents with joint pain and weakness, stiffness in shoulders, knuckles, knees, and hands, jerking awake from sleep due to joint pain, low libido, fatigue, and erectile dysfunction following recent liver failure. He reports abstinence from alcohol and is on medications including acamprosate, folic acid, furosemide, gabapentin, lactulose, potassium chloride, ropinirole, spironolactone, thiamine, and trazodone. Laboratory findings reveal Na 131, K 3.6, Cl 94, CO2 31, glucose 9, creatinine 0.67, Ca 8.0, protein 6.5, albumin 2.6, AST 139, ALT 54, AlkP 187, bilirubin 4.5, WBC 8.7, HGB 12.7, HCT 36.3, MCV 117.5, platelets 121, cholesterol 128, HDL 27.7, LDL calculated 81, triglycerides 96.0, A1C 4.3, TSH 4.95, folate  1.6, B12 452, with no PSA result provided.  Clinical Problem Representation: A 42 year old male with a history of alcohol use disorder and multiple complications thereof, including cirrhosis and recent liver failure, presents with multisystem complaints including musculoskeletal (joint pain and stiffness, weakness), endocrine (low libido, erectile dysfunction), and hematologic (anemia, thrombocytopenia) symptoms, in the context of ongoing recovery from liver failure and comprehensive medication management. The patient's symptoms are multifactorial, with potential contributions from liver disease, medication side effects, and nutritional deficiencies.   Most Likely Diagnoses: - Hepatic Osteodystrophy: The patient's chronic liver disease and cirrhosis can lead to hepatic osteodystrophy, a condition that includes bone pain and musculoskeletal symptoms due to metabolic bone disease associated with chronic liver dysfunction. The joint pain and stiffness described could be manifestations of this condition, exacerbated by electrolyte imbalances and vitamin deficiencies common in liver failure. - Hypogonadism: Given the patient's low libido and erectile dysfunction in the context of liver failure, hypogonadism is a likely diagnosis. Liver disease can impair the metabolism of sex hormones, leading to symptoms of hypogonadism. The elevated TSH may also suggest a component of secondary hypothyroidism, which can further impact sexual function. - Medication Side Effects: The patient is on multiple medications, including gabapentin and spironolactone, which can contribute to fatigue, weakness, and potentially impact sexual function. Gabapentin can cause sedation and weakness, while spironolactone, a potassium-sparing diuretic, can lead to hyperkalemia and contribute to symptoms of weakness and fatigue, although the patient's potassium is currently within normal limits.   Expanded Differential Diagnoses: - Rheumatoid  Arthritis (RA): While less likely  given the absence of specific inflammatory markers or autoimmune history, RA could potentially explain the joint pain and stiffness. However, the pattern of joint involvement and the patient's primary liver disease make this less likely without further supporting evidence. - Vitamin D Deficiency: Common in patients with chronic liver disease due to malabsorption, vitamin D deficiency could contribute to bone pain and weakness. This condition would be supported by the patient's overall nutritional deficiencies and liver disease but would require specific vitamin D level testing for confirmation. - Secondary Hyperparathyroidism: Resulting from chronic liver disease and potential vitamin D deficiency, secondary hyperparathyroidism could explain bone pain and musculoskeletal symptoms. This condition results from compensatory parathyroid hormone (PTH) secretion in response to low calcium levels, which could be exacerbated by the patient's electrolyte imbalances and nutritional deficiencies.   Can't Miss Differential Diagnosis: - Osteoporotic Fractures: Given the patient's chronic liver disease, nutritional deficiencies, and potential for hepatic osteodystrophy, there is a risk for osteoporosis and subsequent fractures, which could present with acute or chronic pain and should not be missed. - Hepatic Encephalopathy: While the patient has a history of hepatic encephalopathy, any acute worsening of mental status or new neurological symptoms could indicate a life-threatening exacerbation, especially in the context of liver failure and electrolyte imbalances. - Spontaneous Bacterial Peritonitis (SBP): In the context of cirrhosis and ascites, SBP is a life-threatening condition that can present subtly with fatigue, abdominal pain, or worsening ascites, and requires prompt recognition and treatment.  This case presents a complex interplay of chronic liver disease, medication effects,  and potential endocrine and metabolic disturbances, necessitating a comprehensive approach to diagnosis and management.    Portal Hypertension (Hcc)   11/23/22 esophagoduodenoscopy [place] showed no varices, portal gastropathy only.   Alcohol Use Disorder   Nov 24, 2022 interim history:    The patient has been sober for approximately three months and is actively using the "I Am Sober" app. They have not been attending [facility] meetings due to work schedule conflicts but expressed interest in online meetings. The patient is currently taking gabapentin and Camprol to minimize cravings for alcohol.   Prior history: Not a drip 3/4-3/11 2024 History severe with alcoholic hepatitis and acute liver failure that seems to be currently resolving.       Reviewed chart data: Active Ambulatory Problems    Diagnosis Date Noted   Alcohol use disorder 08/20/2022   Alcoholic cirrhosis of liver (HCC) 08/20/2022   Drug-induced hypokalemia 08/21/2022   Anemia 08/21/2022   Folate deficiency 08/22/2022   Portal hypertension (HCC) 08/30/2022   Restless legs 08/30/2022   Undifferentiated inflammatory arthritis (HCC) 11/02/2022   Erectile dysfunction 11/02/2022   Other fatigue 11/02/2022   Insomnia 11/02/2022   ANA positive 11/06/2022   Low testosterone in male 11/06/2022   Reactive gastropathy 11/24/2022   Rash 11/24/2022   Night sweats 11/24/2022   Resolved Ambulatory Problems    Diagnosis Date Noted   Liver mass, ruled out 08/20/2022   Overweight 08/20/2022   Panic attack 08/20/2022   Delirium tremens (HCC) 08/20/2022   Liver failure without hepatic coma (HCC) 08/20/2022   Alcohol withdrawal (HCC) 08/21/2022   Alcohol dependence with hallucinations (HCC) 08/21/2022   Hyponatremia 08/21/2022   Thrombocytopenia 08/22/2022   Encephalopathy, hepatic (HCC) 08/23/2022   Alcoholic hepatitis with ascites 08/30/2022   Elevated bilirubin 09/06/2022   Electrolyte abnormality  09/06/2022   Past Medical History:  Diagnosis Date   Acute hepatic encephalopathy (HCC) 08/23/2022   Anxiety  Arthritis 11/02/2022   Depression    Substance abuse (HCC)     Outpatient Medications Prior to Visit  Medication Sig   acamprosate (CAMPRAL) 333 MG tablet Take 2 tablets (666 mg total) by mouth 3 (three) times daily with meals.   folic acid (FOLVITE) 1 MG tablet Take 1 tablet (1 mg total) by mouth daily.   furosemide (LASIX) 40 MG tablet Take 1 tablet (40 mg total) by mouth daily.   gabapentin (NEURONTIN) 400 MG capsule Take 1 capsule (400 mg total) by mouth 3 (three) times daily. Take by mouth: 1 tablet in the mornings, 1 tablet in mid-day, and 2 tablets at bedtime Note dose adjusted from prior  to help treat restless legs, discontinue ropinirole   lactulose (CHRONULAC) 10 GM/15ML solution Take 45 mLs by mouth 3 times daily.   potassium chloride (KLOR-CON M) 10 MEQ tablet Take 1 tablet (10 mEq total) by mouth daily.   potassium chloride (MICRO-K) 10 MEQ CR capsule 1 capsule with food Orally Twice a day   rOPINIRole (REQUIP) 0.25 MG tablet Take 1 tablet (0.25 mg total) by mouth 3 (three) times daily.   sildenafil (VIAGRA) 100 MG tablet Take 0.5-1 tablets (50-100 mg total) by mouth daily as needed for erectile dysfunction.   spironolactone (ALDACTONE) 100 MG tablet Take 1 tablet (100 mg total) by mouth daily.   thiamine (VITAMIN B-1) 100 MG tablet Take 1 tablet (100 mg total) by mouth daily.   traZODone (DESYREL) 50 MG tablet Take 1 tablet (50 mg total) by mouth at bedtime.   No facility-administered medications prior to visit.         Clinical Data Analysis:   Physical Exam  BP 106/68 (BP Location: Left Arm, Patient Position: Sitting)   Pulse 80   Temp 98.7 F (37.1 C) (Temporal)   Ht 5\' 7"  (1.702 m)   Wt 162 lb (73.5 kg)   SpO2 99%   BMI 25.37 kg/m  Wt Readings from Last 10 Encounters:  11/24/22 162 lb (73.5 kg)  11/23/22 165 lb (74.8 kg)  11/02/22 163 lb 12.8 oz  (74.3 kg)  09/06/22 159 lb (72.1 kg)  08/30/22 175 lb 12.8 oz (79.7 kg)  08/21/22 186 lb 3.2 oz (84.5 kg)  08/19/22 186 lb 3.2 oz (84.5 kg)  06/23/21 165 lb (74.8 kg)  05/22/18 180 lb (81.6 kg)  02/23/18 165 lb (74.8 kg)   Vital signs reviewed.  Nursing notes reviewed. Weight trend reviewed. Abnormalities and Problem-Specific physical exam findings:   able to move about with mild discomfort apparent. Miliary rash all over face. Scleral icterus totally resolved.   General Appearance:  No acute distress appreciable.   Well-groomed, healthy-appearing male.  Well proportioned with no abnormal fat distribution.  Good muscle tone. Skin: Clear and well-hydrated. Pulmonary:  Normal work of breathing at rest, no respiratory distress apparent. SpO2: 99 %  Musculoskeletal: All extremities are intact.  Neurological:  Awake, alert, oriented, and engaged.  No obvious focal neurological deficits or cognitive impairments.  Sensorium seems unclouded.   Speech is clear and coherent with logical content. Psychiatric:  Appropriate mood, pleasant and cooperative demeanor, thoughtful and engaged during the exam  Results Reviewed:    No results found for any visits on 11/24/22.  Recent Results (from the past 2160 hour(s))  Comprehensive metabolic panel     Status: None   Collection Time: 09/02/22  8:32 AM  Result Value Ref Range   EGFR 123.0     Comment: Abstracted by HIM  Protime-INR     Status: None   Collection Time: 09/02/22  8:47 AM  Result Value Ref Range   INR 1.10 0.80 - 1.20  Body fluid cell count with differential     Status: Abnormal   Collection Time: 09/03/22  1:57 PM  Result Value Ref Range   Fluid Type-FCT PERITONEAL     Comment: CORRECTED ON 03/08 AT 1529: PREVIOUSLY REPORTED AS ABDOMEN   Color, Fluid YELLOW YELLOW   Appearance, Fluid HAZY (A) CLEAR   Total Nucleated Cell Count, Fluid 210 0 - 1,000 cu mm   Neutrophil Count, Fluid 7 0 - 25 %   Lymphs, Fluid 32 %    Monocyte-Macrophage-Serous Fluid 61 50 - 90 %   Eos, Fluid 0 %   Other Cells, Fluid OTHER CELLS IDENTIFIED AS MESOTHELIAL CELLS %    Comment: Performed at [facility], [address] [place], [place] [address]  Albumin, pleural or peritoneal fluid      Status: None   Collection Time: 09/03/22  1:57 PM  Result Value Ref Range   Albumin, Fluid <1.5 g/dL    Comment: (NOTE) No normal range established for this test Results should be evaluated in conjunction with serum values    Fluid Type-FALB PERITONEAL     Comment: Performed at [facility], [address], [place], [place] [address] CORRECTED ON 03/08 AT 1529: PREVIOUSLY REPORTED AS ABDOMEN   Pathologist smear review     Status: None   Collection Time: 09/03/22  1:57 PM  Result Value Ref Range   Path Review      NEGATIVE FOR MALIGNANCY. BENIGN MESOTHELIAL CELLS AND CHRONIC INFLAMMATORY CELLS    Comment: Reviewed by Lily Lovings. Picklesimer, M.D. 09/07/2022 Performed at [facility], [address], [place], [place] [address]   Rheumatoid Factor     Status: None   Collection Time: 11/02/22  9:03 AM  Result Value Ref Range   Rheumatoid fact SerPl-aCnc <10 <14 IU/mL  Cyclic citrul peptide antibody, IgG ([facility])     Status: None   Collection Time: 11/02/22  9:03 AM  Result Value Ref Range   Cyclic Citrullin Peptide Ab <91 UNITS    Comment: Reference Range Negative:            <20 Weak Positive:       20-39 Moderate Positive:   40-59 Strong Positive:     >59 .   Testosterone     Status: None   Collection Time: 11/02/22  9:03 AM  Result Value Ref Range   Testosterone 362.20 300.00 - 890.00 ng/dL  VITAMIN D 25 Hydroxy (Vit-D Deficiency, Fractures)     Status: Abnormal   Collection Time: 11/02/22  9:03 AM  Result Value Ref Range   VITD 12.47 (L) 30.00 - 100.00 ng/mL  Sedimentation rate     Status: Abnormal   Collection Time: 11/02/22  9:03 AM  Result Value Ref Range   Sed Rate 29 (H) 0 - 15 mm/hr  C-reactive  protein     Status: None   Collection Time: 11/02/22  9:03 AM  Result Value Ref Range   CRP <1.0 0.5 - 20.0 mg/dL  Antinuclear Antib (ANA)     Status: Abnormal   Collection Time: 11/02/22  9:03 AM  Result Value Ref Range   Anti Nuclear Antibody (ANA) POSITIVE (A) NEGATIVE    Comment: ANA IFA is a first line screen for detecting the presence of up to approximately 150 autoantibodies  in various autoimmune diseases. A positive ANA IFA result is suggestive of autoimmune disease and reflexes to titer and pattern. Further laboratory testing may be considered if clinically indicated. . For additional information, please refer to http://education.[facility].com/faq/FAQ177 (This link is being provided for informational/ educational purposes only.) .   CK (Creatine Kinase)     Status: None   Collection Time: 11/02/22  9:03 AM  Result Value Ref Range   Total CK 119 7 - 232 U/L  Comp Met (CMET)     Status: None   Collection Time: 11/02/22  9:03 AM  Result Value Ref Range   Sodium 137 135 - 145 mEq/L   Potassium 4.1 3.5 - 5.1 mEq/L   Chloride 101 96 - 112 mEq/L   CO2 28 19 - 32 mEq/L   Glucose, Bld 97 70 - 99 mg/dL   BUN 10 6 - 23 mg/dL   Creatinine, Ser 6.64 0.40 - 1.50 mg/dL   Total Bilirubin 0.9 0.2 - 1.2 mg/dL   Alkaline Phosphatase 105 39 - 117 U/L   AST 37 0 - 37 U/L   ALT 20 0 - 53 U/L   Total Protein 6.8 6.0 - 8.3 g/dL   Albumin 3.6 3.5 - 5.2 g/dL   GFR 403.47 >42.59 mL/min    Comment: Calculated using the CKD-EPI Creatinine Equation (2021)   Calcium 9.3 8.4 - 10.5 mg/dL  Testosterone, Free, Total, SHBG     Status: Abnormal   Collection Time: 11/02/22  9:03 AM  Result Value Ref Range   Testosterone 649 264 - 916 ng/dL    Comment: Adult male reference interval is based on a population of healthy nonobese males (BMI <30) between 70 and 13 years old. Travison, et.al. JCEM [phone]. PMID: [id number].    Testosterone, Free 2.1 (L) 8.7 - 25.1 pg/mL   Sex  Hormone Binding 139.0 (H) 16.5 - 55.9 nmol/L  CBC with Differential/Platelet     Status: Abnormal   Collection Time: 11/02/22  9:03 AM  Result Value Ref Range   WBC 5.2 4.0 - 10.5 K/uL   RBC 3.91 (L) 4.22 - 5.81 Mil/uL   Hemoglobin 12.3 (L) 13.0 - 17.0 g/dL   HCT 01.6 (L) 01.0 - 93.2 %   MCV 95.5 78.0 - 100.0 fl   MCHC 33.0 30.0 - 36.0 g/dL   RDW 35.5 73.2 - 20.2 %   Platelets 221.0 150.0 - 400.0 K/uL   Neutrophils Relative % 46.5 43.0 - 77.0 %   Lymphocytes Relative 36.5 12.0 - 46.0 %   Monocytes Relative 14.3 (H) 3.0 - 12.0 %   Eosinophils Relative 1.9 0.0 - 5.0 %   Basophils Relative 0.8 0.0 - 3.0 %   Neutro Abs 2.4 1.4 - 7.7 K/uL   Lymphs Abs 1.9 0.7 - 4.0 K/uL   Monocytes Absolute 0.7 0.1 - 1.0 K/uL   Eosinophils Absolute 0.1 0.0 - 0.7 K/uL   Basophils Absolute 0.0 0.0 - 0.1 K/uL  Anti-nuclear ab-titer (ANA titer)     Status: Abnormal   Collection Time: 11/02/22  9:03 AM  Result Value Ref Range   ANA Titer 1 1:40 (H) titer    Comment: A low level ANA titer may be present in pre-clinical autoimmune diseases and normal individuals.                 Reference Range                 <1:40        Negative  1:40-1:80    Low Antibody Level                 >1:80        Elevated Antibody Level .    ANA Pattern 1 Mitotic, Spindle Fibers (A)     Comment: The spindle fibers between the poles are stained in mitotic cells, associated with cone-shaped decoration of the mitotic poles. The pattern is rare in Sjogren's  syndrome, systemic lupus erythematosus (SLE), and other connective tissue diseases. . AC-25: Spindle Fibers . International Consensus on ANA Patterns ([email or web address])    ANA TITER 1:40 (H) titer    Comment: A low level ANA titer may be present in pre-clinical autoimmune diseases and normal individuals.                 Reference Range                 <1:40        Negative                 1:40-1:80    Low Antibody Level                  >1:80        Elevated Antibody Level .    ANA PATTERN Cytoplasmic (A)     Comment: The presence of cytoplasmic fluorescence was noted on the HEp-2 slide. Other reactivities (e.g., anti- mitochondrial antibodies or anti-smooth muscle antibodies) may be responsible for this fluorescence. The clinical significance of this finding is uncertain. Clinical correlation is recommended. . AC-15 to AC-23: Cytoplasmic . International Consensus on ANA Patterns ([email or web address])    ANA TITER 1:40 (H) titer    Comment: A low level ANA titer may be present in pre-clinical autoimmune diseases and normal individuals.                 Reference Range                 <1:40        Negative                 1:40-1:80    Low Antibody Level                 >1:80        Elevated Antibody Level .    ANA PATTERN Nuclear, Fine Speckled (A)     Comment: Fine speckled pattern is associated with Sjogren's syndrome, systemic lupus erythematosus (SLE) and  dermatomyositis. . AC-4: Fine Speckled . 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 STOMACH, BIOPSY:      Gastric antral mucosa with reactive gastropathy.      Gastric oxyntic mucosa with no significant diagnostic alteration.      No H. pylori identified on HE.      Negative for intestinal metaplasia or dysplasia.   GROSS DESCRIPTION:  Received in formalin are tan, soft tissue fragments that are submitted in toto. Number: 5 size: 0.2-0.4 cm blocks: 1 (GRP 11/15/2022)   Final Diagnosis performed by Lance Coon, MD.   Electronically  signed 11/24/2022 Technical component  performed at [facility], [address], [place], [place] [address].  Professional component performed at [employer]. [facility], [address] [place], [place] [address].  Immunohistochemistry Technical component (if applicable) was performed  at [facility]. [address], [address], [place], [place] [address].   IMMUNOHISTOCHEMISTRY DISCLAIMER (if applicable): Some of these immunohistochemical stains may have been developed and the performance characteristics determine by [facility]. Some may not have been cleared or approved by the U.S. Food and Drug Administration. The FDA has determined that such clearance or approval is not necessary. This test is used for clinical purposes. It should not be regarded as investigational or for research. This laboratory is certified under the Clinical Laboratory Improvement Amendments of 1988 (CLIA-88) as qualified to perform high complexity clinical laboratory testing.  The controls stained appropriately.     No image results found.   IR Paracentesis  Result Date: 09/03/2022 INDICATION: Patient with a history of alcoholic cirrhosis with recurrent ascites. Interventional radiology asked to perform a diagnostic and therapeutic paracentesis. EXAM: ULTRASOUND GUIDED PARACENTESIS MEDICATIONS: 1% lidocaine 10 mL COMPLICATIONS: None immediate. PROCEDURE: Informed written consent was obtained from the patient after a discussion of the risks, benefits and alternatives to treatment. A timeout was performed prior to the initiation of the procedure. Initial ultrasound scanning demonstrates a large amount of ascites within the right lower abdominal quadrant. The right lower abdomen was prepped and draped in the usual sterile fashion. 1% lidocaine was used for local anesthesia. Following this, a 19 gauge, 7-cm, Yueh catheter was introduced. An ultrasound image was saved  for documentation purposes. The paracentesis was performed. The catheter was removed and a dressing was applied. The patient tolerated the procedure well without immediate post procedural complication. FINDINGS: A total of approximately 2.9 L of dark yellow fluid was removed. Samples were sent to the laboratory as requested by the clinical team. IMPRESSION: Successful ultrasound-guided paracentesis yielding 2.9 liters of peritoneal fluid. Read by: Alwyn Ren, NP PLAN: The patient has required >/=2 paracenteses in a 30 day period and a formal evaluation by the [facility] has been arranged. Electronically Signed   By: Malachy Moan M.D.   On: 09/03/2022 15:36       Signed: Lula Olszewski, MD 11/24/2022 6:03 PM

## 2022-11-24 NOTE — Assessment & Plan Note (Signed)
Suspicious alcohol withdrawal syndrome but will check for tuberculosis Also could be autoimmune related, given the inflammatory arthritis

## 2022-11-24 NOTE — Assessment & Plan Note (Signed)
Generalized Arthritis of Unknown Source: Worsening joint pain in elbows, shoulders, knees, hands, and ankles. No visible inflammation. Lab work showed elevated sed rate indicating inflammation. ANA test was technically positive for lupus but likely a false positive. Rheumatology referral already in place. -Start Celebrex 100mg  daily for arthritis pain, with potential to increase dose if well-tolerated and not providing adequate pain relief. -Schedule follow-up in 1-2 weeks to assess response to Celebrex.

## 2022-11-24 NOTE — Assessment & Plan Note (Addendum)
Alcohol Use Disorder in Remission: Patient reports continued sobriety. Currently on Camprol and Gabapentin to minimize cravings. Encouraged continued participation in online AA meetings and obtaining a sponsor. -Continue Camprol and Gabapentin. -Consider reducing these medications once a sponsor is in place and patient is stable in sobriety.

## 2022-11-26 ENCOUNTER — Ambulatory Visit: Payer: BC Managed Care – PPO

## 2022-11-26 NOTE — Progress Notes (Signed)
Pt is for reading of TB skin test on right forearm. PPD, no redness or swelling, 0 mm.

## 2022-11-28 ENCOUNTER — Encounter (HOSPITAL_COMMUNITY): Payer: Self-pay | Admitting: Internal Medicine

## 2022-12-08 ENCOUNTER — Ambulatory Visit: Payer: BC Managed Care – PPO | Admitting: Internal Medicine

## 2022-12-08 DIAGNOSIS — K766 Portal hypertension: Secondary | ICD-10-CM | POA: Diagnosis not present

## 2022-12-08 DIAGNOSIS — R0609 Other forms of dyspnea: Secondary | ICD-10-CM | POA: Diagnosis not present

## 2022-12-08 DIAGNOSIS — K7031 Alcoholic cirrhosis of liver with ascites: Secondary | ICD-10-CM | POA: Diagnosis not present

## 2022-12-14 ENCOUNTER — Ambulatory Visit (INDEPENDENT_AMBULATORY_CARE_PROVIDER_SITE_OTHER): Payer: BC Managed Care – PPO | Admitting: Internal Medicine

## 2022-12-14 ENCOUNTER — Encounter: Payer: Self-pay | Admitting: Internal Medicine

## 2022-12-14 VITALS — BP 106/70 | HR 82 | Temp 98.5°F | Ht 67.0 in | Wt 157.6 lb

## 2022-12-14 DIAGNOSIS — M064 Inflammatory polyarthropathy: Secondary | ICD-10-CM

## 2022-12-14 DIAGNOSIS — R61 Generalized hyperhidrosis: Secondary | ICD-10-CM | POA: Diagnosis not present

## 2022-12-14 DIAGNOSIS — E559 Vitamin D deficiency, unspecified: Secondary | ICD-10-CM

## 2022-12-14 DIAGNOSIS — F1090 Alcohol use, unspecified, uncomplicated: Secondary | ICD-10-CM

## 2022-12-14 DIAGNOSIS — K7030 Alcoholic cirrhosis of liver without ascites: Secondary | ICD-10-CM | POA: Diagnosis not present

## 2022-12-14 DIAGNOSIS — R21 Rash and other nonspecific skin eruption: Secondary | ICD-10-CM | POA: Diagnosis not present

## 2022-12-14 LAB — COMPREHENSIVE METABOLIC PANEL
ALT: 22 U/L (ref 0–53)
AST: 32 U/L (ref 0–37)
Albumin: 4 g/dL (ref 3.5–5.2)
Alkaline Phosphatase: 120 U/L — ABNORMAL HIGH (ref 39–117)
BUN: 13 mg/dL (ref 6–23)
CO2: 29 mEq/L (ref 19–32)
Calcium: 9.4 mg/dL (ref 8.4–10.5)
Chloride: 101 mEq/L (ref 96–112)
Creatinine, Ser: 0.61 mg/dL (ref 0.40–1.50)
GFR: 120.94 mL/min (ref >60.00)
Glucose, Bld: 93 mg/dL (ref 70–99)
Potassium: 4.3 mEq/L (ref 3.5–5.1)
Sodium: 137 mEq/L (ref 135–145)
Total Bilirubin: 0.4 mg/dL (ref 0.2–1.2)
Total Protein: 7.7 g/dL (ref 6.0–8.3)

## 2022-12-14 LAB — CBC WITH DIFFERENTIAL/PLATELET
Basophils Absolute: 0 10*3/uL (ref 0.0–0.1)
Basophils Relative: 0.5 % (ref 0.0–3.0)
Eosinophils Absolute: 0.1 10*3/uL (ref 0.0–0.7)
Eosinophils Relative: 2.2 % (ref 0.0–5.0)
HCT: 39.7 % (ref 39.0–52.0)
Hemoglobin: 13.3 g/dL (ref 13.0–17.0)
Lymphocytes Relative: 33.7 % (ref 12.0–46.0)
Lymphs Abs: 1.2 10*3/uL (ref 0.7–4.0)
MCHC: 33.4 g/dL (ref 30.0–36.0)
MCV: 90.1 fl (ref 78.0–100.0)
Monocytes Absolute: 0.4 10*3/uL (ref 0.1–1.0)
Monocytes Relative: 11.3 % (ref 3.0–12.0)
Neutro Abs: 1.9 10*3/uL (ref 1.4–7.7)
Neutrophils Relative %: 52.3 % (ref 43.0–77.0)
Platelets: 170 10*3/uL (ref 150.0–400.0)
RBC: 4.41 Mil/uL (ref 4.22–5.81)
RDW: 13.5 % (ref 11.5–15.5)
WBC: 3.6 10*3/uL — ABNORMAL LOW (ref 4.0–10.5)

## 2022-12-14 LAB — C-REACTIVE PROTEIN: CRP: <1 mg/dL (ref 0.5–20.0)

## 2022-12-14 LAB — SEDIMENTATION RATE: Sed Rate: 40 mm/hr — ABNORMAL HIGH (ref 0–15)

## 2022-12-14 MED ORDER — PREDNISONE 20 MG PO TABS
ORAL_TABLET | ORAL | 0 refills | Status: DC
Start: 2022-12-14 — End: 2023-10-20

## 2022-12-14 MED ORDER — CELECOXIB 200 MG PO CAPS
200.0000 mg | ORAL_CAPSULE | Freq: Two times a day (BID) | ORAL | 3 refills | Status: DC
Start: 2022-12-14 — End: 2023-10-20

## 2022-12-14 NOTE — Progress Notes (Signed)
[facility]: [phone]   Routine Medical Office Visit  Patient:  John Maxwell      Age: 42 y.o.       Sex:  male  Date:   12/14/2022 PCP:    Lula Olszewski, MD   Today's Healthcare Provider: Lula Olszewski, MD   Assessment and Plan:   Labs and a chest x-ray as requested by the transplant team are ordered.    John Maxwell was seen today for 2 week follow-up and arthritis.  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 The primary concern was worsening joint pain, described as a pervasive, inflammatory arthritis of unknown origin. The pain, which began approximately one to two months ago, is located in the elbows, shoulders, knees, hands, and ankles. The patient reported that the pain is constant throughout the day and has been progressively worsening. The pain is so severe that it affects the patient's mobility, requiring them to brace themselves when standing up from a kneeling position due to the fear of falling. Has yet to take any pain medications.   My clinical judgment, informed by a comprehensive review of all available data (including the AI analysis from [facility]) guides the development of the optimal treatment plan for the patient. While AI insights provide valuable information, they are always interpreted within the context of the patient's unique medical history, presenting complaints, and physical examination findings. Any deviations from AI-generated suggestions are meticulously documented and clearly explained to ensure transparency and optimal patient care. This approach underscores both the patient's well-being and my professional responsibility as their clinician.   AI-Generated Clinical Decision Support Report from [facility] (conducted on 11/02/2022 using de-identified patient data)  Comprehensive Review of the Case: A 42 year old male with a complex medical history including  alcohol use disorder, panic attack, alcoholic cirrhosis of the liver, acute and chronic liver failure, hyponatremia, drug-induced hypokalemia, anemia, thrombocytopenia, folate deficiency, hepatic encephalopathy, portal hypertension, alcoholic hepatitis with ascites, restless legs syndrome, elevated bilirubin, and electrolyte abnormality presents with joint pain and weakness, stiffness in shoulders, knuckles, knees, and hands, jerking awake from sleep due to joint pain, low libido, fatigue, and erectile dysfunction following recent liver failure. He reports abstinence from alcohol and is on medications including acamprosate, folic acid, furosemide, gabapentin, lactulose, potassium chloride, ropinirole, spironolactone, thiamine, and trazodone. Laboratory findings reveal Na 131, K 3.6, Cl 94, CO2 31, glucose 9, creatinine 0.67, Ca 8.0, protein 6.5, albumin 2.6, AST 139, ALT 54, AlkP 187, bilirubin 4.5, WBC 8.7, HGB 12.7, HCT 36.3, MCV 117.5, platelets 121, cholesterol 128, HDL 27.7, LDL calculated 81, triglycerides 96.0, A1C 4.3, TSH 4.95, folate 1.6, B12 452, with no PSA result provided.  Clinical Problem Representation: A 42 year old male with a history of alcohol use disorder and multiple complications thereof, including cirrhosis and recent liver failure, presents with multisystem complaints including musculoskeletal (joint pain and stiffness, weakness), endocrine (low libido, erectile dysfunction), and hematologic (anemia, thrombocytopenia) symptoms, in the context of ongoing recovery from liver failure and comprehensive medication management. The patient's symptoms are multifactorial, with potential contributions from liver disease, medication side effects, and nutritional deficiencies.   Most Likely Diagnoses: - Hepatic Osteodystrophy: The patient's chronic liver disease and cirrhosis can lead to hepatic osteodystrophy, a condition that includes bone pain and musculoskeletal symptoms due to metabolic bone  disease associated with chronic liver dysfunction. The joint pain and stiffness described could be manifestations of this condition, exacerbated by electrolyte imbalances and vitamin  deficiencies common in liver failure. - Hypogonadism: Given the patient's low libido and erectile dysfunction in the context of liver failure, hypogonadism is a likely diagnosis. Liver disease can impair the metabolism of sex hormones, leading to symptoms of hypogonadism. The elevated TSH may also suggest a component of secondary hypothyroidism, which can further impact sexual function. - Medication Side Effects: The patient is on multiple medications, including gabapentin and spironolactone, which can contribute to fatigue, weakness, and potentially impact sexual function. Gabapentin can cause sedation and weakness, while spironolactone, a potassium-sparing diuretic, can lead to hyperkalemia and contribute to symptoms of weakness and fatigue, although the patient's potassium is currently within normal limits.   Expanded Differential Diagnoses: - Rheumatoid Arthritis (RA): While less likely given the absence of specific inflammatory markers or autoimmune history, RA could potentially explain the joint pain and stiffness. However, the pattern of joint involvement and the patient's primary liver disease make this less likely without further supporting evidence. - Vitamin D Deficiency: Common in patients with chronic liver disease due to malabsorption, vitamin D deficiency could contribute to bone pain and weakness. This condition would be supported by the patient's overall nutritional deficiencies and liver disease but would require specific vitamin D level testing for confirmation. - Secondary Hyperparathyroidism: Resulting from chronic liver disease and potential vitamin D deficiency, secondary hyperparathyroidism could explain bone pain and musculoskeletal symptoms. This condition results from compensatory parathyroid hormone  (PTH) secretion in response to low calcium levels, which could be exacerbated by the patient's electrolyte imbalances and nutritional deficiencies.   Can't Miss Differential Diagnosis: - Osteoporotic Fractures: Given the patient's chronic liver disease, nutritional deficiencies, and potential for hepatic osteodystrophy, there is a risk for osteoporosis and subsequent fractures, which could present with acute or chronic pain and should not be missed. - Hepatic Encephalopathy: While the patient has a history of hepatic encephalopathy, any acute worsening of mental status or new neurological symptoms could indicate a life-threatening exacerbation, especially in the context of liver failure and electrolyte imbalances. - Spontaneous Bacterial Peritonitis (SBP): In the context of cirrhosis and ascites, SBP is a life-threatening condition that can present subtly with fatigue, abdominal pain, or worsening ascites, and requires prompt recognition and treatment.  This case presents a complex interplay of chronic liver disease, medication effects, and potential endocrine and metabolic disturbances, necessitating a comprehensive approach to diagnosis and management.   Assessment & Plan: Arthritis: They exhibit persistent joint pain and stiffness in the knees, elbows, and shoulders, with mild improvement from Celebrex. A positive ANA test suggests an autoimmune etiology. We will increase the Celebrex dose and initiate a prednisone burst to alleviate symptoms. X-rays of the hands, shoulders, knees, and lumbar spine are ordered, and a rheumatology consultation is scheduled for 01/19/2023.  This is a challenging complex systemic problem that is not resolving and has started since stopping drinking ? If possibly due to one of the medications he has been started on.  Orders: -     Celecoxib; Take 1 capsule (200 mg total) by mouth 2 (two) times daily.  Dispense: 180 capsule; Refill: 3 -     predniSONE; Take 2 pills  for 3 days, 1 pill for 4 days  Dispense: 10 tablet; Refill: 0 -     DG Hand Complete Right; Future -     DG Hand Complete Left; Future -     DG Shoulder Left; Future -     DG Shoulder Right; Future -     DG Chest 2 View; Future -  DG Knee Complete 4 Views Left; Future -     DG Knee Complete 4 Views Right; Future -     DG Lumbar Spine Complete; Future -     Sedimentation rate -     C-reactive protein  Rash Overview: Interim history: improving  Nov 24, 2022 interim history:   In addition to joint pain, the patient reported a new rash on their face, chest, and upper back that appeared about a month ago.   Assessment & Plan: Suspicious for secondary to bilirubin elevations that are better now Will see derm Skin Rash: There is noted improvement in their skin rash, which may be associated with liver disease or an autoimmune process. A dermatology consultation is pending.   Night sweats Overview: December 14, 2022 interim history:   no better, nightly sweats, ppd negative.  Esr 27 /white blood cell(s) negative. Most labwork improved.  Nov 24, 2022 interim history:   The patient also reported experiencing intense sweating upon waking up for the past week.  Assessment & Plan:  Night Sweats: They experience persistent night sweats, possibly related to recent liver injury with a hormonal etiology. An endocrinology consultation is pending.  ppd testing was negative last week.  an ai analysis has been previously performed, which was reviewed.  I believe this problem is hormonal or autoimmune but widespread infection is not ruled out.   Alcoholic cirrhosis of liver without ascites (HCC) Overview: Demonstrated on CT abdomen 07/2022 Liver Disease: Their condition is stable with no current need for a liver transplant. Monitoring by the [facility] transplant team will continue.  Orders: -     CBC with Differential/Platelet -     Comprehensive metabolic panel -     Protime-INR; Future -     AFP  tumor marker -     Hepatitis B surface antibody,qualitative -     Hepatitis A antibody, total  Alcohol use disorder Overview: December 14, 2022 interim history:   Alcohol Use Disorder They report no current relapses, emphasizing the importance of maintaining their current medication regimen, including acamprosate and gabapentin, due to potential life-threatening consequences of relapse in the context of liver disease. Encouragement to attend 12-step meetings and find a sponsor is provided.  Nov 24, 2022 interim history:    The patient has been sober for approximately three months and is actively using the "I Am Sober" app. They have not been attending [employer] [facility] meetings due to work schedule conflicts but expressed interest in online meetings. The patient is currently taking gabapentin and Camprol to minimize cravings for alcohol.   Prior history: Not a drip 3/4-3/11 2024 History severe with alcoholic hepatitis and acute liver failure that seems to be currently resolving.   Assessment & Plan: Alcohol Use Disorder They report no current relapses, emphasizing the importance of maintaining their current medication regimen, including acamprosate and gabapentin, due to potential life-threatening consequences of relapse in the context of liver disease. Encouragement to attend 12-step meetings and find a sponsor is provided.   Vitamin D deficiency Overview:  Vitamin D supplementation will continue.  Clinical symptoms that can be associated with vitamin D deficiency include bone pain, muscle weakness, and general fatigue Vitamin D levels are affected by dietary choices and sunlight exposure  Lab Results  Component Value Date   VD25OH 12.47 (L) 11/02/2022   Lab Results  Component Value Date   TSH 4.95 08/19/2022   ALKPHOS 120 (H) 12/14/2022   CALCIUM 9.4 12/14/2022   Lab Results  Component Value Date   CALCIUM 9.4 12/14/2022   CALCIUM 9.3 11/02/2022   CALCIUM 8.0 (L)  08/24/2022   CALCIUM 8.0 (L) 08/23/2022   CALCIUM 7.7 (L) 08/22/2022               Clinical Presentation:    42 y.o. male here today for 2 week follow-up and Arthritis (Extensive, severe, not improving, Celebrex helped only a little, rheumatology won't see for over a month.)  AI-Extracted: Discussed the use of AI scribe software for clinical note transcription with the patient, who gave verbal consent to proceed.  History of Present Illness   The patient, with a history of liver disease and arthritis, reports persistent joint pain that has not improved. The pain primarily affects the knees, elbows, and shoulders. The patient describes the pain as stiffness that restricts mobility, particularly when reaching or lifting the arms above 90 degrees, suggesting possible rotator cuff inflammation. The patient also experiences knee pain, particularly after prolonged sitting, such as during long car rides. The pain radiates throughout the legs upon standing. The patient also reports generalized stiffness, particularly in the hands, which is most severe upon waking and improves slightly throughout the day.  In addition to joint pain, the patient has been experiencing night sweats, which occur almost nightly. The patient also reports a facial rash, which has shown slight improvement.  The patient has a history of liver disease and was referred for a possible liver transplant. However, the patient reports that the transplant may not be necessary as the liver condition appears to be improving.  The patient has a history of alcohol use and is currently on medication to control cravings. The patient reports occasional cravings but has not relapsed.  The patient has been taking vitamin D supplements and has stopped drinking, which has resulted in significant improvements in lab results. The patient reports feeling physical changes since stopping drinking.        Reviewed chart data: Active Ambulatory  Problems    Diagnosis Date Noted   Alcohol use disorder 08/20/2022   Alcoholic cirrhosis of liver (HCC) 08/20/2022   Drug-induced hypokalemia 08/21/2022   Anemia 08/21/2022   Folate deficiency 08/22/2022   Portal hypertension (HCC) 08/30/2022   Restless legs 08/30/2022   Undifferentiated inflammatory arthritis (HCC) 11/02/2022   Erectile dysfunction 11/02/2022   Other fatigue 11/02/2022   Insomnia 11/02/2022   ANA positive 11/06/2022   Low testosterone in male 11/06/2022   Reactive gastropathy 11/24/2022   Rash 11/24/2022   Night sweats 11/24/2022   Paronychia of finger of right hand 12/25/2017   Vitamin D deficiency 12/14/2022   Resolved Ambulatory Problems    Diagnosis Date Noted   Liver mass, ruled out 08/20/2022   Overweight 08/20/2022   Panic attack 08/20/2022   Delirium tremens (HCC) 08/20/2022   Liver failure without hepatic coma (HCC) 08/20/2022   Alcohol withdrawal (HCC) 08/21/2022   Alcohol dependence with hallucinations (HCC) 08/21/2022   Hyponatremia 08/21/2022   Thrombocytopenia 08/22/2022   Encephalopathy, hepatic (HCC) 08/23/2022   Alcoholic hepatitis with ascites 08/30/2022   Elevated bilirubin 09/06/2022   Electrolyte abnormality 09/06/2022   Past Medical History:  Diagnosis Date   Acute hepatic encephalopathy (HCC) 08/23/2022   Anxiety    Arthritis 11/02/2022   Depression    Substance abuse (HCC)     Outpatient Medications Prior to Visit  Medication Sig   acamprosate (CAMPRAL) 333 MG tablet Take 2 tablets (666 mg total) by mouth 3 (three) times daily with meals.  folic acid (FOLVITE) 1 MG tablet Take 1 tablet (1 mg total) by mouth daily.   furosemide (LASIX) 40 MG tablet Take 1 tablet (40 mg total) by mouth daily.   gabapentin (NEURONTIN) 400 MG capsule Take 1 capsule (400 mg total) by mouth 3 (three) times daily. Take by mouth: 1 tablet in the mornings, 1 tablet in mid-day, and 2 tablets at bedtime Note dose adjusted from prior  to help treat  restless legs, discontinue ropinirole   lactulose (CHRONULAC) 10 GM/15ML solution Take 45 mLs by mouth 3 times daily.   potassium chloride (KLOR-CON M) 10 MEQ tablet Take 1 tablet (10 mEq total) by mouth daily.   potassium chloride (MICRO-K) 10 MEQ CR capsule 1 capsule with food Orally Twice a day   rOPINIRole (REQUIP) 0.25 MG tablet Take 1 tablet (0.25 mg total) by mouth 3 (three) times daily.   sildenafil (VIAGRA) 100 MG tablet Take 0.5-1 tablets (50-100 mg total) by mouth daily as needed for erectile dysfunction.   spironolactone (ALDACTONE) 100 MG tablet Take 1 tablet (100 mg total) by mouth daily.   thiamine (VITAMIN B-1) 100 MG tablet Take 1 tablet (100 mg total) by mouth daily.   traZODone (DESYREL) 50 MG tablet Take 1 tablet (50 mg total) by mouth at bedtime.   [DISCONTINUED] celecoxib (CELEBREX) 100 MG capsule Take 1 capsule (100 mg total) by mouth 2 (two) times daily.   No facility-administered medications prior to visit.         Clinical Data Analysis:   Physical Exam  BP 106/70 (BP Location: Left Arm, Patient Position: Sitting)   Pulse 82   Temp 98.5 F (36.9 C) (Temporal)   Ht 5\' 7"  (1.702 m)   Wt 157 lb 9.6 oz (71.5 kg)   SpO2 100%   BMI 24.68 kg/m  Wt Readings from Last 10 Encounters:  12/14/22 157 lb 9.6 oz (71.5 kg)  11/24/22 162 lb (73.5 kg)  11/23/22 165 lb (74.8 kg)  11/02/22 163 lb 12.8 oz (74.3 kg)  09/06/22 159 lb (72.1 kg)  08/30/22 175 lb 12.8 oz (79.7 kg)  08/21/22 186 lb 3.2 oz (84.5 kg)  08/19/22 186 lb 3.2 oz (84.5 kg)  06/23/21 165 lb (74.8 kg)  05/22/18 180 lb (81.6 kg)   Vital signs reviewed.  Nursing notes reviewed. Weight trend reviewed. Abnormalities and Problem-Specific  Physical Exam   ABDOMEN: Decreased fluid accumulation. SKIN: Improvement in facial rash.      Full range of motion all joints. General Appearance:  No acute distress appreciable.   Well-groomed, healthy-appearing male.  Well proportioned with no abnormal fat distribution.   Good muscle tone. Skin: Clear and well-hydrated. Pulmonary:  Normal work of breathing at rest, no respiratory distress apparent. SpO2: 100 %  Musculoskeletal: All extremities are intact.  Neurological:  Awake, alert, oriented, and engaged.  No obvious focal neurological deficits or cognitive impairments.  Sensorium seems unclouded.   Speech is clear and coherent with logical content. Psychiatric:  Appropriate mood, pleasant and cooperative demeanor, thoughtful and engaged during the exam  Results Reviewed:    Results for orders placed or performed in visit on 12/14/22  CBC with Differential/Platelet  Result Value Ref Range   WBC 3.6 (L) 4.0 - 10.5 K/uL   RBC 4.41 4.22 - 5.81 Mil/uL   Hemoglobin 13.3 13.0 - 17.0 g/dL   HCT 16.1 09.6 - 04.5 %   MCV 90.1 78.0 - 100.0 fl   MCHC 33.4 30.0 - 36.0 g/dL   RDW  13.5 11.5 - 15.5 %   Platelets 170.0 150.0 - 400.0 K/uL   Neutrophils Relative % 52.3 43.0 - 77.0 %   Lymphocytes Relative 33.7 12.0 - 46.0 %   Monocytes Relative 11.3 3.0 - 12.0 %   Eosinophils Relative 2.2 0.0 - 5.0 %   Basophils Relative 0.5 0.0 - 3.0 %   Neutro Abs 1.9 1.4 - 7.7 K/uL   Lymphs Abs 1.2 0.7 - 4.0 K/uL   Monocytes Absolute 0.4 0.1 - 1.0 K/uL   Eosinophils Absolute 0.1 0.0 - 0.7 K/uL   Basophils Absolute 0.0 0.0 - 0.1 K/uL  Comp Met (CMET)  Result Value Ref Range   Sodium 137 135 - 145 mEq/L   Potassium 4.3 3.5 - 5.1 mEq/L   Chloride 101 96 - 112 mEq/L   CO2 29 19 - 32 mEq/L   Glucose, Bld 93 70 - 99 mg/dL   BUN 13 6 - 23 mg/dL   Creatinine, Ser 8.65 0.40 - 1.50 mg/dL   Total Bilirubin 0.4 0.2 - 1.2 mg/dL   Alkaline Phosphatase 120 (H) 39 - 117 U/L   AST 32 0 - 37 U/L   ALT 22 0 - 53 U/L   Total Protein 7.7 6.0 - 8.3 g/dL   Albumin 4.0 3.5 - 5.2 g/dL   GFR 784.69 >62.95 mL/min   Calcium 9.4 8.4 - 10.5 mg/dL  Sedimentation rate  Result Value Ref Range   Sed Rate 40 (H) 0 - 15 mm/hr  C-reactive protein  Result Value Ref Range   CRP <1.0 0.5 - 20.0  mg/dL    Office Visit on 28/41/3244  Component Date Value   WBC 12/14/2022 3.6 (L)    RBC 12/14/2022 4.41    Hemoglobin 12/14/2022 13.3    HCT 12/14/2022 39.7    MCV 12/14/2022 90.1    MCHC 12/14/2022 33.4    RDW 12/14/2022 13.5    Platelets 12/14/2022 170.0    Neutrophils Relative % 12/14/2022 52.3    Lymphocytes Relative 12/14/2022 33.7    Monocytes Relative 12/14/2022 11.3    Eosinophils Relative 12/14/2022 2.2    Basophils Relative 12/14/2022 0.5    Neutro Abs 12/14/2022 1.9    Lymphs Abs 12/14/2022 1.2    Monocytes Absolute 12/14/2022 0.4    Eosinophils Absolute 12/14/2022 0.1    Basophils Absolute 12/14/2022 0.0    Sodium 12/14/2022 137    Potassium 12/14/2022 4.3    Chloride 12/14/2022 101    CO2 12/14/2022 29    Glucose, Bld 12/14/2022 93    BUN 12/14/2022 13    Creatinine, Ser 12/14/2022 0.61    Total Bilirubin 12/14/2022 0.4    Alkaline Phosphatase 12/14/2022 120 (H)    AST 12/14/2022 32    ALT 12/14/2022 22    Total Protein 12/14/2022 7.7    Albumin 12/14/2022 4.0    GFR 12/14/2022 120.94    Calcium 12/14/2022 9.4    Sed Rate 12/14/2022 40 (H)    CRP 12/14/2022 <1.0   Admission on 11/23/2022, Discharged on 11/23/2022  Component Date Value   SURGICAL PATHOLOGY 11/23/2022                     Value:SURGICAL PATHOLOGY CASE: [id number] PATIENT: Lyndall Surber Surgical Pathology Report     Clinical History: 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 STOMACH, BIOPSY:      Gastric antral mucosa with reactive gastropathy.  Gastric oxyntic mucosa with no significant diagnostic alteration.      No H. pylori identified on HE.      Negative for intestinal metaplasia or dysplasia.   GROSS DESCRIPTION:  Received in formalin are tan, soft tissue fragments that are submitted in toto. Number: 5 size: 0.2-0.4 cm blocks: 1 (GRP 11/15/2022)   Final Diagnosis performed by Lance Coon, MD.   Electronically  signed 11/24/2022 Technical component performed at [facility], [address], [place], [place] [address].  Professional component performed at [employer], [address], [place], [place] [address].  Immunohistochemistry Technical component (if applicable) was performed                          at [facility]. [address], [place], [place] [address].   IMMUNOHISTOCHEMISTRY DISCLAIMER (if applicable): Some of these immunohistochemical stains may have been developed and the performance characteristics determine by [facility]. Some may not have been cleared or approved by the U.S. Food and Drug Administration. The FDA has determined that such clearance or approval is not necessary. This test is used for clinical purposes. It should not be regarded as investigational or for research. This laboratory is certified under the Clinical Laboratory Improvement Amendments of 1988 (CLIA-88) as qualified to perform high complexity clinical laboratory testing.  The controls stained appropriately.   Office Visit on 11/02/2022  Component Date Value   Rheumatoid fact SerPl-aC* 11/02/2022 <10    Cyclic Citrullin Peptide* 11/02/2022 <16    Testosterone 11/02/2022 362.20    VITD 11/02/2022 12.47 (L)    Sed Rate 11/02/2022 29 (H)    CRP 11/02/2022 <1.0    Anti Nuclear Antibody (A* 11/02/2022 POSITIVE (A)    Total CK 11/02/2022 119    Sodium 11/02/2022 137    Potassium 11/02/2022 4.1    Chloride 11/02/2022 101    CO2 11/02/2022 28    Glucose, Bld 11/02/2022 97    BUN 11/02/2022 10    Creatinine, Ser 11/02/2022 0.61    Total Bilirubin 11/02/2022 0.9    Alkaline Phosphatase 11/02/2022 105    AST 11/02/2022 37    ALT 11/02/2022 20    Total Protein 11/02/2022 6.8    Albumin 11/02/2022 3.6    GFR 11/02/2022 121.04    Calcium 11/02/2022 9.3    Testosterone 11/02/2022 649    Testosterone, Free 11/02/2022 2.1 (L)     Sex Hormone Binding 11/02/2022 139.0 (H)    WBC 11/02/2022 5.2    RBC 11/02/2022 3.91 (L)    Hemoglobin 11/02/2022 12.3 (L)    HCT 11/02/2022 37.4 (L)    MCV 11/02/2022 95.5    MCHC 11/02/2022 33.0    RDW 11/02/2022 15.5    Platelets 11/02/2022 221.0    Neutrophils Relative % 11/02/2022 46.5    Lymphocytes Relative 11/02/2022 36.5    Monocytes Relative 11/02/2022 14.3 (H)    Eosinophils Relative 11/02/2022 1.9    Basophils Relative 11/02/2022 0.8    Neutro Abs 11/02/2022 2.4    Lymphs Abs 11/02/2022 1.9    Monocytes Absolute 11/02/2022 0.7    Eosinophils Absolute 11/02/2022 0.1    Basophils Absolute 11/02/2022 0.0    ANA Titer 1 11/02/2022 1:40 (H)    ANA Pattern 1 11/02/2022 Mitotic, Spindle Fibers (A)    ANA TITER 11/02/2022 1:40 (H)    ANA PATTERN 11/02/2022 Cytoplasmic (A)    ANA TITER 11/02/2022 1:40 (H)  ANA PATTERN 11/02/2022 Nuclear, Fine Speckled (A)   Scanned Document on 09/07/2022  Component Date Value   EGFR 09/02/2022 123.0    INR 09/02/2022 1.10   Hospital Outpatient Visit on 09/03/2022  Component Date Value   Fluid Type-FCT 09/03/2022 PERITONEAL    Color, Fluid 09/03/2022 YELLOW    Appearance, Fluid 09/03/2022 HAZY (A)    Total Nucleated Cell Cou* 09/03/2022 210    Neutrophil Count, Fluid 09/03/2022 7    Lymphs, Fluid 09/03/2022 32    Monocyte-Macrophage-Sero* 09/03/2022 61    Eos, Fluid 09/03/2022 0    Other Cells, Fluid 09/03/2022 OTHER CELLS IDENTIFIED AS MESOTHELIAL CELLS    Albumin, Fluid 09/03/2022 <1.5    Fluid Type-FALB 09/03/2022 PERITONEAL    Path Review 09/03/2022 NEGATIVE FOR MALIGNANCY. BENIGN MESOTHELIAL CELLS AND CHRONIC INFLAMMATORY CELLS   Admission on 08/21/2022, Discharged on 08/25/2022  Component Date Value   Sodium 08/21/2022 127 (L)    Potassium 08/21/2022 3.1 (L)    Chloride 08/21/2022 87 (L)    CO2 08/21/2022 30    Glucose, Bld 08/21/2022 100 (H)    BUN 08/21/2022 9    Creatinine, Ser 08/21/2022 0.53 (L)    Calcium  08/21/2022 7.9 (L)    Total Protein 08/21/2022 6.7    Albumin 08/21/2022 2.5 (L)    AST 08/21/2022 257 (H)    ALT 08/21/2022 73 (H)    Alkaline Phosphatase 08/21/2022 232 (H)    Total Bilirubin 08/21/2022 7.5 (H)    GFR, Estimated 08/21/2022 >60    Anion gap 08/21/2022 10    Alcohol, Ethyl (B) 08/21/2022 <10    WBC 08/21/2022 13.3 (H)    RBC 08/21/2022 3.00 (L)    Hemoglobin 08/21/2022 12.0 (L)    HCT 08/21/2022 32.5 (L)    MCV 08/21/2022 108.3 (H)    MCH 08/21/2022 40.0 (H)    MCHC 08/21/2022 36.9 (H)    RDW 08/21/2022 15.3    Platelets 08/21/2022 123 (L)    nRBC 08/21/2022 0.0    Opiates 08/21/2022 NONE DETECTED    Cocaine 08/21/2022 NONE DETECTED    Benzodiazepines 08/21/2022 NONE DETECTED    Amphetamines 08/21/2022 NONE DETECTED    Tetrahydrocannabinol 08/21/2022 NONE DETECTED    Barbiturates 08/21/2022 NONE DETECTED    Lipase 08/21/2022 39    Prothrombin Time 08/21/2022 16.1 (H)    INR 08/21/2022 1.3 (H)    Ammonia 08/21/2022 28    Sodium, Ur 08/21/2022 <10    Chloride Urine 08/21/2022 19    HIV Screen 4th Generatio* 08/21/2022 Non Reactive    Osmolality 08/21/2022 283    Vitamin B-12 08/21/2022 452    Folate 08/21/2022 1.6 (L)    Osmolality, Ur 08/21/2022 776    WBC 08/22/2022 9.4    RBC 08/22/2022 2.95 (L)    Hemoglobin 08/22/2022 11.6 (L)    HCT 08/22/2022 32.9 (L)    MCV 08/22/2022 111.5 (H)    MCH 08/22/2022 39.3 (H)    MCHC 08/22/2022 35.3    RDW 08/22/2022 15.9 (H)    Platelets 08/22/2022 105 (L)    nRBC 08/22/2022 0.2    Sodium 08/22/2022 126 (L)    Potassium 08/22/2022 3.1 (L)    Chloride 08/22/2022 89 (L)    CO2 08/22/2022 31    Glucose, Bld 08/22/2022 104 (H)    BUN 08/22/2022 12    Creatinine, Ser 08/22/2022 0.61    Calcium 08/22/2022 7.7 (L)    Total Protein 08/22/2022 6.4 (L)    Albumin 08/22/2022 2.5 (L)  AST 08/22/2022 186 (H)    ALT 08/22/2022 60 (H)    Alkaline Phosphatase 08/22/2022 208 (H)    Total Bilirubin 08/22/2022 6.3 (H)     GFR, Estimated 08/22/2022 >60    Anion gap 08/22/2022 6    Hepatitis B Surface Ag 08/23/2022 NON REACTIVE    HCV Ab 08/23/2022 NON REACTIVE    Hep A IgM 08/23/2022 NON REACTIVE    Hep B C IgM 08/23/2022 NON REACTIVE    Ammonia 08/23/2022 53 (H)    Sodium 08/23/2022 130 (L)    Potassium 08/23/2022 3.4 (L)    Chloride 08/23/2022 91 (L)    CO2 08/23/2022 32    Glucose, Bld 08/23/2022 111 (H)    BUN 08/23/2022 10    Creatinine, Ser 08/23/2022 0.59 (L)    Calcium 08/23/2022 8.0 (L)    Total Protein 08/23/2022 6.1 (L)    Albumin 08/23/2022 2.4 (L)    AST 08/23/2022 155 (H)    ALT 08/23/2022 54 (H)    Alkaline Phosphatase 08/23/2022 189 (H)    Total Bilirubin 08/23/2022 4.3 (H)    GFR, Estimated 08/23/2022 >60    Anion gap 08/23/2022 7    Prothrombin Time 08/23/2022 16.0 (H)    INR 08/23/2022 1.3 (H)    WBC 08/23/2022 8.4    RBC 08/23/2022 2.78 (L)    Hemoglobin 08/23/2022 11.1 (L)    HCT 08/23/2022 31.9 (L)    MCV 08/23/2022 114.7 (H)    MCH 08/23/2022 39.9 (H)    MCHC 08/23/2022 34.8    RDW 08/23/2022 16.0 (H)    Platelets 08/23/2022 113 (L)    nRBC 08/23/2022 0.0    CYTOLOGY - NON GYN 08/23/2022                     Value:CYTOLOGY - NON PAP CASE: [id number] PATIENT: Jshaun Behringer Non-Gynecological Cytology Report     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 ASCITES, PARACENTESIS:   FINAL MICROSCOPIC DIAGNOSIS: - No malignant cells identified - Numerous benign mesothelial cells with scattered lymphocytes within a serous background  SPECIMEN ADEQUACY: Satisfactory for evaluation  GROSS: Received is/are 1000 ccs of yellow fluid. (CM:cm) Prepared: Smears:  0 Concentration Method (Thin Prep):  1 Cell Block:  1 Additional Studies:  n/a     Final Diagnosis performed by Jerene Bears, MD.   Electronically signed 08/25/2022 Technical component performed at [facility], [address]  [place], [place] [address].  Professional component performed at [employer], [address] [place], [place] [address].  Immunohistochemistry Technical component (if applicable) was performed at [facility]. [address], [place], [place] [address].   IMMUNOHISTOCHEMISTRY DISCLAIMER (if applicable): Some of these immunohistochemical stains may have been developed and the performance characteristics determine by [facility]. Some may not have been cleared or approved by the U.S. Food and Drug Administration. The FDA has determined that such clearance or approval is not necessary. This test is used for clinical purposes. It should not be regarded as investigational or for research. This laboratory is certified under the Clinical Laboratory Improvement Amendments of 1988 (CLIA-88) as qualified to perform high complexity clinical laboratory testing.  The controls stained appropriately.    Fluid Type-FCT 08/23/2022 CYTO PERI    Color, Fluid  08/23/2022 YELLOW (A)    Appearance, Fluid 08/23/2022 CLEAR    Total Nucleated Cell Cou* 08/23/2022 173    Neutrophil Count, Fluid 08/23/2022 7    Lymphs, Fluid 08/23/2022 7    Monocyte-Macrophage-Sero* 08/23/2022 86    Eos, Fluid 08/23/2022 0    Albumin, Fluid 08/23/2022 <1.5    Fluid Type-FALB 08/23/2022 CYTO PERI    WBC 08/24/2022 8.7    RBC 08/24/2022 3.09 (L)    Hemoglobin 08/24/2022 12.7 (L)    HCT 08/24/2022 36.3 (L)    MCV 08/24/2022 117.5 (H)    MCH 08/24/2022 41.1 (H)    MCHC 08/24/2022 35.0    RDW 08/24/2022 16.9 (H)    Platelets 08/24/2022 121 (L)    nRBC 08/24/2022 0.2    Sodium 08/24/2022 131 (L)    Potassium 08/24/2022 3.6    Chloride 08/24/2022 94 (L)    CO2 08/24/2022 31    Glucose, Bld 08/24/2022 111 (H)    BUN 08/24/2022 9    Creatinine, Ser 08/24/2022 0.67    Calcium 08/24/2022 8.0 (L)    Total Protein 08/24/2022 6.5    Albumin 08/24/2022  2.6 (L)    AST 08/24/2022 139 (H)    ALT 08/24/2022 54 (H)    Alkaline Phosphatase 08/24/2022 187 (H)    Total Bilirubin 08/24/2022 4.5 (H)    GFR, Estimated 08/24/2022 >60    Anion gap 08/24/2022 6    Prothrombin Time 08/24/2022 15.6 (H)    INR 08/24/2022 1.3 (H)   Office Visit on 08/19/2022  Component Date Value   AFP-Tumor Marker 08/19/2022 4.2    Cholesterol 08/19/2022 128    Triglycerides 08/19/2022 96.0    HDL 08/19/2022 27.70 (L)    VLDL 08/19/2022 19.2    LDL Cholesterol 08/19/2022 81    Total CHOL/HDL Ratio 08/19/2022 5    NonHDL 08/19/2022 99.98    TSH 08/19/2022 4.95    Hgb A1c MFr Bld 08/19/2022 4.3 (L)    Sodium 08/19/2022 134 (L)    Potassium 08/19/2022 3.4 (L)    Chloride 08/19/2022 87 (L)    CO2 08/19/2022 33 (H)    Glucose, Bld 08/19/2022 95    BUN 08/19/2022 6    Creatinine, Ser 08/19/2022 0.71    Total Bilirubin 08/19/2022 3.6 (H)    Alkaline Phosphatase 08/19/2022 295 (H)    AST 08/19/2022 232 (H)    ALT 08/19/2022 56 (H)    Total Protein 08/19/2022 6.5    Albumin 08/19/2022 3.0 (L)    GFR 08/19/2022 115.78    Calcium 08/19/2022 8.2 (L)    WBC 08/19/2022 9.3    RBC 08/19/2022 3.14 (L)    Platelets 08/19/2022 150.0    Hemoglobin 08/19/2022 12.7 (L)    HCT 08/19/2022 36.9 (L)    MCV 08/19/2022 117.5 Repeated and verified X2. (H)    MCHC 08/19/2022 34.5    RDW 08/19/2022 15.7 (H)   Admission on 07/31/2022, Discharged on 07/31/2022  Component Date Value   WBC 07/31/2022 8.3    RBC 07/31/2022 3.43 (L)    Hemoglobin 07/31/2022 13.3    HCT 07/31/2022 36.9 (L)    MCV 07/31/2022 107.6 (H)    MCH 07/31/2022 38.8 (H)    MCHC 07/31/2022 36.0    RDW 07/31/2022 16.4 (H)    Platelets 07/31/2022 128 (L)    nRBC 07/31/2022 0.0    Neutrophils Relative % 07/31/2022 75    Neutro Abs 07/31/2022 6.3    Lymphocytes Relative 07/31/2022 13    Lymphs  Abs 07/31/2022 1.1    Monocytes Relative 07/31/2022 10    Monocytes Absolute 07/31/2022 0.8    Eosinophils  Relative 07/31/2022 0    Eosinophils Absolute 07/31/2022 0.0    Basophils Relative 07/31/2022 1    Basophils Absolute 07/31/2022 0.1    Immature Granulocytes 07/31/2022 1    Abs Immature Granulocytes 07/31/2022 0.05    Color, Urine 07/31/2022 AMBER (A)    APPearance 07/31/2022 CLEAR    Specific Gravity, Urine 07/31/2022 1.020    pH 07/31/2022 6.0    Glucose, UA 07/31/2022 NEGATIVE    Hgb urine dipstick 07/31/2022 NEGATIVE    Bilirubin Urine 07/31/2022 MODERATE (A)    Ketones, ur 07/31/2022 NEGATIVE    Protein, ur 07/31/2022 30 (A)    Nitrite 07/31/2022 NEGATIVE    Leukocytes,Ua 07/31/2022 NEGATIVE    RBC / HPF 07/31/2022 0-5    WBC, UA 07/31/2022 0-5    Bacteria, UA 07/31/2022 MANY (A)    Squamous Epithelial / HPF 07/31/2022 0-5    Sodium 07/31/2022 132 (L)    Potassium 07/31/2022 3.4 (L)    Chloride 07/31/2022 95 (L)    CO2 07/31/2022 28    Glucose, Bld 07/31/2022 137 (H)    BUN 07/31/2022 <5 (L)    Creatinine, Ser 07/31/2022 0.59 (L)    Calcium 07/31/2022 7.6 (L)    Total Protein 07/31/2022 6.9    Albumin 07/31/2022 2.8 (L)    AST 07/31/2022 175 (H)    ALT 07/31/2022 37    Alkaline Phosphatase 07/31/2022 245 (H)    Total Bilirubin 07/31/2022 2.6 (H)    GFR, Estimated 07/31/2022 >60    Anion gap 07/31/2022 9    Lipase 07/31/2022 23    No image results found.   No results found.     This encounter employed real-time, collaborative documentation. The patient actively reviewed and updated their medical record on a shared screen, ensuring transparency and facilitating joint problem-solving for the problem list, overview, and plan. This approach promotes accurate, informed care. The treatment plan was discussed and reviewed in detail, including medication safety, potential side effects, and all patient questions. We confirmed understanding and comfort with the plan. Follow-up instructions were established, including contacting the office for any concerns, returning if symptoms  worsen, persist, or new symptoms develop, and precautions for potential emergency department visits. ----------------------------------------------------- Lula Olszewski, MD  12/14/2022 7:44 PM  [facility]:  [phone]

## 2022-12-14 NOTE — Assessment & Plan Note (Addendum)
Suspicious for secondary to bilirubin elevations that are better now Will see derm Skin Rash: There is noted improvement in their skin rash, which may be associated with liver disease or an autoimmune process. A dermatology consultation is pending.

## 2022-12-14 NOTE — Assessment & Plan Note (Signed)
Alcohol Use Disorder They report no current relapses, emphasizing the importance of maintaining their current medication regimen, including acamprosate and gabapentin, due to potential life-threatening consequences of relapse in the context of liver disease. Encouragement to attend 12-step meetings and find a sponsor is provided.

## 2022-12-14 NOTE — Progress Notes (Signed)
Share with lindsay king from care team.

## 2022-12-14 NOTE — Patient Instructions (Addendum)
It was a pleasure seeing you today! Your health and satisfaction are our top priorities.   Glenetta Hew, MD  Go get XR at [facility] walk-in m-f 8-5p  Next Steps:  [x]  Early Intervention: Schedule sooner appointment, call our on-call services, or go to emergency room if there is Increase in pain or discomfort New or worsening symptoms Sudden or severe changes in your health [x]  Flexible Follow-Up: We recommend a No follow-ups on file. for optimal routine care. This allows for progress monitoring and treatment adjustments. [x]  Preventive Care: Schedule your annual preventive care visit! It's typically covered by insurance and helps identify potential health issues early. [x]  Lab & X-ray Appointments: Incomplete tests scheduled today, or call to schedule. X-rays: [facility] [place] (M-F, 8:30am-noon or 1pm-5pm). [x]  Medical Information Release: Sign a release form at front desk to obtain relevant medical information we don't have.  Making the Most of Our Focused (20 minute) Appointments:  [x]   Clearly state your top concerns at the beginning of the visit to focus our discussion [x]   If you anticipate you will need more time, please inform the front desk during scheduling - we can book multiple appointments in the same week. [x]   If you have transportation problems- use our convenient video appointments or ask about transportation support. [x]   We can get down to business faster if you use MyChart to update information before the visit and submit non-urgent questions before your visit. Thank you for taking the time to provide details through MyChart.  Let our nurse know and she can import this information into your encounter documents.  Arrival and Wait Times: [x]   Arriving on time ensures that everyone receives prompt attention. [x]   Early morning (8a) and afternoon (1p) appointments tend to have shortest wait times. [x]   Unfortunately, we cannot delay appointments for late  arrivals or hold slots during phone calls.  Getting Answers and Following Up  [x]   Simple Questions & Concerns: For quick questions or basic follow-up after your visit, reach [place] at [phone] or MyChart messaging. [x]   Complex Concerns: If your concern is more complex, scheduling an appointment might be best. Discuss this with the staff to find the most suitable option. [x]   Lab & Imaging Results: We'll contact you directly if results are abnormal or you don't use MyChart. Most normal results will be on MyChart within 2-3 business days, with a review message from Dr. Jon Billings. Haven't heard back in 2 weeks? Need results sooner? Contact us at [phone]. [x]   Referrals: Our referral coordinator will manage specialist referrals. The specialist's office should contact you within 2 weeks to schedule an appointment. Call us if you haven't heard from them after 2 weeks.  Staying Connected  [x]   MyChart: Activate your MyChart for the fastest way to access results and message [place]. See the last page of this paperwork for instructions on how to activate.  Bring to Your Next Appointment  [x]   Medications: Please bring all your medication bottles to your next appointment to ensure we have an accurate record of your prescriptions. [x]   Health Diaries: If you're monitoring any health conditions at home, keeping a diary of your readings can be very helpful for discussions at your next appointment.  Billing  [x]   X-ray & Lab Orders: These are billed by separate companies. Contact the invoicing company directly for questions or concerns. [x]   Visit Charges: Discuss any billing inquiries with our administrative services team.  Your Satisfaction Matters  [  x]  Share Your Experience: We strive for your satisfaction! If you have any complaints, or preferably compliments, please let Dr. Jon Billings know directly or contact our Practice Administrators, Edwena Felty or [employer], by asking at the front  desk.   Reviewing Your Records  [x]   Review this early draft of your clinical encounter notes below and the final encounter summary tomorrow on MyChart after its been completed.   Undifferentiated inflammatory arthritis (HCC) -     Celecoxib; Take 1 capsule (200 mg total) by mouth 2 (two) times daily.  Dispense: 180 capsule; Refill: 3 -     predniSONE; Take 2 pills for 3 days, 1 pill for 4 days  Dispense: 10 tablet; Refill: 0 -     DG Hand Complete Right; Future -     DG Hand Complete Left; Future -     DG Shoulder Left; Future -     DG Shoulder Right; Future -     DG Chest 2 View; Future -     DG Knee Complete 4 Views Left; Future -     DG Knee Complete 4 Views Right; Future -     DG Lumbar Spine Complete; Future -     Sedimentation rate -     C-reactive protein  Rash Assessment & Plan: Suspicious for secondary to bilirubin elevations that are better now Will see derm   Night sweats  Alcoholic cirrhosis of liver without ascites (HCC) -     CBC with Differential/Platelet -     Comprehensive metabolic panel -     Protime-INR; Future -     AFP tumor marker -     Hepatitis B surface antibody,qualitative -     Hepatitis A antibody, total

## 2022-12-14 NOTE — Assessment & Plan Note (Signed)
  Night Sweats: They experience persistent night sweats, possibly related to recent liver injury with a hormonal etiology. An endocrinology consultation is pending.  ppd testing was negative last week.  an ai analysis has been previously performed, which was reviewed.  I believe this problem is hormonal or autoimmune but widespread infection is not ruled out.

## 2022-12-14 NOTE — Assessment & Plan Note (Signed)
Arthritis: They exhibit persistent joint pain and stiffness in the knees, elbows, and shoulders, with mild improvement from Celebrex. A positive ANA test suggests an autoimmune etiology. We will increase the Celebrex dose and initiate a prednisone burst to alleviate symptoms. X-rays of the hands, shoulders, knees, and lumbar spine are ordered, and a rheumatology consultation is scheduled for 01/19/2023.  This is a challenging complex systemic problem that is not resolving and has started since stopping drinking ? If possibly due to one of the medications he has been started on.

## 2022-12-15 LAB — HEPATITIS B SURFACE ANTIBODY,QUALITATIVE: Hep B S Ab: REACTIVE — AB

## 2022-12-15 LAB — HEPATITIS A ANTIBODY, TOTAL: Hepatitis A AB,Total: REACTIVE — AB

## 2022-12-15 LAB — AFP TUMOR MARKER: AFP-Tumor Marker: 2.5 ng/mL (ref <6.1)

## 2022-12-15 NOTE — Progress Notes (Signed)
Please forward results to Audrie Lia on his care team

## 2022-12-31 ENCOUNTER — Other Ambulatory Visit (HOSPITAL_COMMUNITY): Payer: Self-pay

## 2023-01-05 ENCOUNTER — Telehealth: Payer: BC Managed Care – PPO | Admitting: Internal Medicine

## 2023-01-17 ENCOUNTER — Ambulatory Visit: Payer: BC Managed Care – PPO | Admitting: Internal Medicine

## 2023-02-16 ENCOUNTER — Other Ambulatory Visit: Payer: Self-pay

## 2023-02-16 DIAGNOSIS — G4700 Insomnia, unspecified: Secondary | ICD-10-CM

## 2023-02-16 MED ORDER — TRAZODONE HCL 50 MG PO TABS
50.0000 mg | ORAL_TABLET | Freq: Every day | ORAL | 0 refills | Status: DC
Start: 2023-02-16 — End: 2023-04-13

## 2023-03-01 DIAGNOSIS — M79641 Pain in right hand: Secondary | ICD-10-CM | POA: Diagnosis not present

## 2023-03-01 DIAGNOSIS — M359 Systemic involvement of connective tissue, unspecified: Secondary | ICD-10-CM | POA: Diagnosis not present

## 2023-03-01 DIAGNOSIS — M79642 Pain in left hand: Secondary | ICD-10-CM | POA: Diagnosis not present

## 2023-03-01 DIAGNOSIS — M549 Dorsalgia, unspecified: Secondary | ICD-10-CM | POA: Diagnosis not present

## 2023-03-01 DIAGNOSIS — M2550 Pain in unspecified joint: Secondary | ICD-10-CM | POA: Diagnosis not present

## 2023-03-01 DIAGNOSIS — M25512 Pain in left shoulder: Secondary | ICD-10-CM | POA: Diagnosis not present

## 2023-03-01 DIAGNOSIS — F1010 Alcohol abuse, uncomplicated: Secondary | ICD-10-CM | POA: Diagnosis not present

## 2023-03-01 DIAGNOSIS — R768 Other specified abnormal immunological findings in serum: Secondary | ICD-10-CM | POA: Diagnosis not present

## 2023-03-01 DIAGNOSIS — M25511 Pain in right shoulder: Secondary | ICD-10-CM | POA: Diagnosis not present

## 2023-03-03 ENCOUNTER — Ambulatory Visit: Payer: BC Managed Care – PPO | Admitting: Internal Medicine

## 2023-03-23 DIAGNOSIS — M359 Systemic involvement of connective tissue, unspecified: Secondary | ICD-10-CM | POA: Diagnosis not present

## 2023-03-23 DIAGNOSIS — M545 Low back pain, unspecified: Secondary | ICD-10-CM | POA: Diagnosis not present

## 2023-03-23 DIAGNOSIS — M2550 Pain in unspecified joint: Secondary | ICD-10-CM | POA: Diagnosis not present

## 2023-03-23 DIAGNOSIS — R768 Other specified abnormal immunological findings in serum: Secondary | ICD-10-CM | POA: Diagnosis not present

## 2023-03-25 ENCOUNTER — Other Ambulatory Visit (HOSPITAL_BASED_OUTPATIENT_CLINIC_OR_DEPARTMENT_OTHER): Payer: Self-pay | Admitting: Internal Medicine

## 2023-03-25 DIAGNOSIS — K7031 Alcoholic cirrhosis of liver with ascites: Secondary | ICD-10-CM

## 2023-03-28 ENCOUNTER — Ambulatory Visit: Payer: BC Managed Care – PPO | Admitting: Internal Medicine

## 2023-03-29 DIAGNOSIS — K7030 Alcoholic cirrhosis of liver without ascites: Secondary | ICD-10-CM | POA: Diagnosis not present

## 2023-03-31 ENCOUNTER — Ambulatory Visit (HOSPITAL_BASED_OUTPATIENT_CLINIC_OR_DEPARTMENT_OTHER)
Admission: RE | Admit: 2023-03-31 | Discharge: 2023-03-31 | Disposition: A | Discharge status: Home / Self Care | Payer: BC Managed Care – PPO | Source: Ambulatory Visit | Attending: Internal Medicine | Admitting: Internal Medicine

## 2023-03-31 DIAGNOSIS — K7031 Alcoholic cirrhosis of liver with ascites: Secondary | ICD-10-CM | POA: Diagnosis not present

## 2023-03-31 DIAGNOSIS — K7460 Unspecified cirrhosis of liver: Secondary | ICD-10-CM | POA: Diagnosis not present

## 2023-03-31 DIAGNOSIS — K7689 Other specified diseases of liver: Secondary | ICD-10-CM | POA: Diagnosis not present

## 2023-04-07 DIAGNOSIS — K7031 Alcoholic cirrhosis of liver with ascites: Secondary | ICD-10-CM | POA: Diagnosis not present

## 2023-04-13 ENCOUNTER — Encounter: Payer: Self-pay | Admitting: Internal Medicine

## 2023-04-13 ENCOUNTER — Ambulatory Visit (INDEPENDENT_AMBULATORY_CARE_PROVIDER_SITE_OTHER): Payer: BC Managed Care – PPO | Admitting: Internal Medicine

## 2023-04-13 VITALS — BP 124/74 | HR 77 | Temp 98.1°F | Ht 67.0 in | Wt 154.4 lb

## 2023-04-13 DIAGNOSIS — G4700 Insomnia, unspecified: Secondary | ICD-10-CM | POA: Diagnosis not present

## 2023-04-13 DIAGNOSIS — E876 Hypokalemia: Secondary | ICD-10-CM

## 2023-04-13 DIAGNOSIS — R7989 Other specified abnormal findings of blood chemistry: Secondary | ICD-10-CM

## 2023-04-13 DIAGNOSIS — M779 Enthesopathy, unspecified: Secondary | ICD-10-CM

## 2023-04-13 DIAGNOSIS — R768 Other specified abnormal immunological findings in serum: Secondary | ICD-10-CM

## 2023-04-13 DIAGNOSIS — T50905A Adverse effect of unspecified drugs, medicaments and biological substances, initial encounter: Secondary | ICD-10-CM

## 2023-04-13 DIAGNOSIS — M064 Inflammatory polyarthropathy: Secondary | ICD-10-CM

## 2023-04-13 DIAGNOSIS — G2581 Restless legs syndrome: Secondary | ICD-10-CM

## 2023-04-13 DIAGNOSIS — F1090 Alcohol use, unspecified, uncomplicated: Secondary | ICD-10-CM

## 2023-04-13 DIAGNOSIS — R21 Rash and other nonspecific skin eruption: Secondary | ICD-10-CM | POA: Diagnosis not present

## 2023-04-13 DIAGNOSIS — E559 Vitamin D deficiency, unspecified: Secondary | ICD-10-CM

## 2023-04-13 DIAGNOSIS — R61 Generalized hyperhidrosis: Secondary | ICD-10-CM

## 2023-04-13 DIAGNOSIS — K7030 Alcoholic cirrhosis of liver without ascites: Secondary | ICD-10-CM

## 2023-04-13 DIAGNOSIS — D538 Other specified nutritional anemias: Secondary | ICD-10-CM

## 2023-04-13 LAB — COMPREHENSIVE METABOLIC PANEL
ALT: 18 U/L (ref 0–53)
AST: 23 U/L (ref 0–37)
Albumin: 4.2 g/dL (ref 3.5–5.2)
Alkaline Phosphatase: 136 U/L — ABNORMAL HIGH (ref 39–117)
BUN: 11 mg/dL (ref 6–23)
CO2: 29 meq/L (ref 19–32)
Calcium: 9.3 mg/dL (ref 8.4–10.5)
Chloride: 102 meq/L (ref 96–112)
Creatinine, Ser: 0.65 mg/dL (ref 0.40–1.50)
GFR: 118.37 mL/min (ref >60.00)
Glucose, Bld: 99 mg/dL (ref 70–99)
Potassium: 4 meq/L (ref 3.5–5.1)
Sodium: 137 meq/L (ref 135–145)
Total Bilirubin: 0.6 mg/dL (ref 0.2–1.2)
Total Protein: 7 g/dL (ref 6.0–8.3)

## 2023-04-13 LAB — IBC + FERRITIN
Ferritin: 52.4 ng/mL (ref 22.0–322.0)
Iron: 103 ug/dL (ref 42–165)
Saturation Ratios: 33.1 % (ref 20.0–50.0)
TIBC: 310.8 ug/dL (ref 250.0–450.0)
Transferrin: 222 mg/dL (ref 212.0–360.0)

## 2023-04-13 LAB — C-REACTIVE PROTEIN: CRP: <1 mg/dL (ref 0.5–20.0)

## 2023-04-13 LAB — VITAMIN D 25 HYDROXY (VIT D DEFICIENCY, FRACTURES): VITD: 15.41 ng/mL — ABNORMAL LOW (ref 30.00–100.00)

## 2023-04-13 LAB — CBC WITH DIFFERENTIAL/PLATELET
Basophils Absolute: 0 10*3/uL (ref 0.0–0.1)
Basophils Relative: 0.4 % (ref 0.0–3.0)
Eosinophils Absolute: 0 10*3/uL (ref 0.0–0.7)
Eosinophils Relative: 0.5 % (ref 0.0–5.0)
HCT: 38.8 % — ABNORMAL LOW (ref 39.0–52.0)
Hemoglobin: 12.7 g/dL — ABNORMAL LOW (ref 13.0–17.0)
Lymphocytes Relative: 30.3 % (ref 12.0–46.0)
Lymphs Abs: 1.6 10*3/uL (ref 0.7–4.0)
MCHC: 32.9 g/dL (ref 30.0–36.0)
MCV: 92.6 fL (ref 78.0–100.0)
Monocytes Absolute: 0.4 10*3/uL (ref 0.1–1.0)
Monocytes Relative: 7 % (ref 3.0–12.0)
Neutro Abs: 3.3 10*3/uL (ref 1.4–7.7)
Neutrophils Relative %: 61.8 % (ref 43.0–77.0)
Platelets: 181 10*3/uL (ref 150.0–400.0)
RBC: 4.18 Mil/uL — ABNORMAL LOW (ref 4.22–5.81)
RDW: 13.6 % (ref 11.5–15.5)
WBC: 5.3 10*3/uL (ref 4.0–10.5)

## 2023-04-13 LAB — LIPID PANEL
Cholesterol: 184 mg/dL (ref 0–200)
HDL: 73.2 mg/dL (ref >39.00)
LDL Cholesterol: 96 mg/dL (ref 0–99)
NonHDL: 110.79
Total CHOL/HDL Ratio: 3
Triglycerides: 73 mg/dL (ref 0.0–149.0)
VLDL: 14.6 mg/dL (ref 0.0–40.0)

## 2023-04-13 LAB — TESTOSTERONE: Testosterone: 328.34 ng/dL (ref 300.00–890.00)

## 2023-04-13 LAB — SEDIMENTATION RATE: Sed Rate: 19 mm/h — ABNORMAL HIGH (ref 0–15)

## 2023-04-13 MED ORDER — TRAZODONE HCL 50 MG PO TABS: 50.0000 mg | ORAL_TABLET | Freq: Every day | ORAL | 0 refills | Status: DC

## 2023-04-13 MED ORDER — GABAPENTIN 400 MG PO CAPS: 400.0000 mg | ORAL_CAPSULE | Freq: Three times a day (TID) | ORAL | 1 refills | Status: DC

## 2023-04-13 NOTE — Patient Instructions (Signed)
VISIT SUMMARY:  During your recent visit, we discussed your ongoing commitment to sobriety from alcohol, your persistent arthritis pain, and your general health. You reported adherence to your prescribed medications and an overall feeling of wellness, despite some health challenges. We also discussed your engagement with specialists and your compliance with recommended lab work and imaging studies.  YOUR PLAN:  -ALCOHOL USE DISORDER: You are doing well in maintaining your sobriety. We will continue your current medications, Acamprosate and Gabapentin, which help prevent a relapse. It's important to stay vigilant and committed to your sobriety.  -INFLAMMATORY ARTHRITIS: Your arthritis has improved in many areas, but you still have pain in your left elbow and shoulder. We suspect this might be due to tendon issues rather than joint inflammation. We will refer you to a sports medicine specialist who may consider steroid injections for these areas. We may also consider a tennis elbow strap for your left elbow and a cream called Voltaren for local pain relief.  -VITAMIN D DEFICIENCY: You have been taking a Vitamin D supplement, but we need to check your current Vitamin D levels. We will order a blood test for this.  -ANEMIA, HYPOKALEMIA, LOW TESTOSTERONE: These were issues in the past, but we need to check your current levels. We will order blood work for this.  -GENERAL HEALTH MAINTENANCE: We will order comprehensive blood work to assess your overall health status. We will follow up in 6 months, or sooner if your symptoms worsen.  INSTRUCTIONS:  Please continue to take your prescribed medications as directed. We will be ordering blood work to check your Vitamin D levels and your levels for anemia, hypokalemia, and low testosterone. We will also refer you to a sports medicine specialist for your arthritis pain. Please make sure to schedule and attend these appointments. We will follow up with you in 6  months, or sooner if your symptoms worsen.

## 2023-04-13 NOTE — Assessment & Plan Note (Signed)
He is on supplementation, but his current status is unknown. We will order blood work to check his current Vitamin D levels.

## 2023-04-13 NOTE — Assessment & Plan Note (Signed)
He has shown improvement in multiple joints with residual pain in his left elbow and shoulder, suspected to be tendinopathies rather than joint inflammation. We will refer him to sports medicine for potential steroid injections in his left elbow and shoulder, consider a tennis elbow strap for his left elbow, and consider Voltaren cream for local pain relief.

## 2023-04-13 NOTE — Assessment & Plan Note (Signed)
He is maintaining sobriety and has declined Antabuse. He continues to use Acamprosate and Gabapentin for relapse prevention. We will continue Acamprosate and Gabapentin and have encouraged him to maintain sobriety and vigilance against relapse.

## 2023-04-13 NOTE — Progress Notes (Signed)
[facility]: [phone]   -- Medical Office Visit --  Patient:  John Maxwell      Age: 42 y.o.       Sex:  male  Date:   04/13/2023 Patient Care Team: Lula Olszewski, MD as PCP - General (Internal Medicine) Audrie Lia, MD as Referring Physician (Internal Medicine) Lynann Bologna, DO as Consulting Physician (Gastroenterology) Today's Healthcare Provider: Lula Olszewski, MD   Assessment & Plan Insomnia, unspecified type  Alcohol use disorder He is maintaining sobriety and has declined Antabuse. He continues to use Acamprosate and Gabapentin for relapse prevention. We will continue Acamprosate and Gabapentin and have encouraged him to maintain sobriety and vigilance against relapse. Restless legs  Undifferentiated inflammatory arthritis (HCC) He has shown improvement in multiple joints with residual pain in his left elbow and shoulder, suspected to be tendinopathies rather than joint inflammation. We will refer him to sports medicine for potential steroid injections in his left elbow and shoulder, consider a tennis elbow strap for his left elbow, and consider Voltaren cream for local pain relief. Rash  Night sweats  Alcoholic cirrhosis, unspecified whether ascites present (HCC)  Drug-induced hypokalemia  Other specified nutritional anemias  Low testosterone in male  Vitamin D deficiency He is on supplementation, but his current status is unknown. We will order blood work to check his current Vitamin D levels. ANA positive  Tendonitis  We will order comprehensive blood work to assess his overall health status and follow up in 6 months or sooner if symptoms worsen.      Recommended follow-up:  Future Appointments  Date Time Provider Department Center  05/30/2023 10:00 AM Lula Olszewski, MD [facility]      Subjective   42 y.o. male who has Alcohol use disorder; Alcoholic cirrhosis of liver (HCC); Drug-induced hypokalemia; Anemia;  Folate deficiency; Portal hypertension (HCC); Restless legs; Undifferentiated inflammatory arthritis (HCC); Erectile dysfunction; Other fatigue; Insomnia; ANA positive; Low testosterone in male; Reactive gastropathy; Rash; Night sweats; Paronychia of finger of right hand; and Vitamin D deficiency on their problem list. His reasons/main concerns/chief complaints for today's office visit are Alcohol Problem (Pt here to discuss refills for gabapentin,/ trazadone/)   ------------------------------------------------------------------------------------------------------------------------ AI-Extracted: Discussed the use of AI scribe software for clinical note transcription with the patient, who gave verbal consent to proceed.  History of Present Illness   The patient, with a history of alcohol use disorder, cirrhosis, and inflammatory arthritis, presents for a follow-up visit. He reports adherence to his prescribed medications, including Sorine for alcohol cessation, Trazodone, Gabapentin, and Celebrex. He denies any recent alcohol consumption and expresses a commitment to maintaining sobriety.  The patient reports persistent arthritis pain, primarily localized to the left shoulder and elbow. The pain is exacerbated by reaching and lifting activities, and the elbow joint exhibits a popping sensation with certain movements. He also mentions chronic back pain, which has been previously attributed to arthritis.  The patient has been experiencing night sweats, the severity of which has remained consistent over time. He also reports a persistent rash on the forehead, which has improved since the last visit.  The patient has been actively engaged with multiple specialists, including a rheumatologist and a hepatologist at the [facility]. He has been compliant with recommended lab work and imaging studies, including X-rays of the back and other joints.  The patient is also managing a diagnosis of anemia,  hypokalemia, low testosterone, and vitamin D deficiency. He has been taking a vitamin  D supplement and reports adherence to his prescribed medications.  Despite these health challenges, the patient reports feeling well overall. 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  Problem list overviews that were updated at today's visit: No problems updated. Current Outpatient Medications on File Prior to Visit  Medication Sig   acamprosate (CAMPRAL) 333 MG tablet Take 2 tablets (666 mg total) by mouth 3 (three) times daily with meals.   celecoxib (CELEBREX) 200 MG capsule Take 1 capsule (200 mg total) by mouth 2 (two) times daily.   folic acid (FOLVITE) 1 MG tablet Take 1 tablet (1 mg total) by mouth daily.   furosemide (LASIX) 40 MG tablet Take 1 tablet (40 mg total) by mouth daily.   lactulose (CHRONULAC) 10 GM/15ML solution Take 45 mLs by mouth 3 times daily.   potassium chloride (KLOR-CON M) 10 MEQ tablet Take 1 tablet (10 mEq total) by mouth daily.   sildenafil (VIAGRA) 100 MG tablet Take 0.5-1 tablets (50-100 mg total) by mouth daily as needed for erectile dysfunction.   spironolactone (ALDACTONE) 100 MG tablet Take 1 tablet (100 mg total) by mouth daily.   thiamine (VITAMIN B-1) 100 MG tablet Take  1 tablet (100 mg total) by mouth daily.   potassium chloride (MICRO-K) 10 MEQ CR capsule 1 capsule with food Orally Twice a day (Patient not taking: Reported on 04/13/2023)   predniSONE (DELTASONE) 20 MG tablet Take 2 pills for 3 days, 1 pill for 4 days (Patient not taking: Reported on 04/13/2023)   rOPINIRole (REQUIP) 0.25 MG tablet Take 1 tablet (0.25 mg total) by mouth 3 (three) times daily. (Patient not taking: Reported on 04/13/2023)   No current facility-administered medications on file prior to visit.   Medications Discontinued During This Encounter  Medication Reason   gabapentin (NEURONTIN) 400 MG capsule Reorder   traZODone (DESYREL) 50 MG tablet Reorder    Objective   Physical Exam  BP 124/74   Pulse 77   Temp 98.1 F (36.7 C)   Ht 5\' 7"  (1.702 m)   Wt 154 lb 6.4 oz (70 kg)   SpO2 97%   BMI 24.18 kg/m  Wt Readings from Last 10 Encounters:  04/13/23 154 lb 6.4 oz (70 kg)  12/14/22 157 lb 9.6 oz (71.5 kg)  11/24/22 162 lb (73.5 kg)  11/23/22 165 lb (74.8 kg)  11/02/22 163 lb 12.8 oz (74.3 kg)  09/06/22 159 lb (72.1 kg)  08/30/22 175 lb 12.8 oz (79.7 kg)  08/21/22 186 lb 3.2 oz (84.5 kg)  08/19/22 186 lb 3.2 oz (84.5 kg)  06/23/21 165 lb (74.8 kg)   Vital signs reviewed.  Nursing notes reviewed. Weight trend reviewed. Abnormalities and Problem-Specific physical exam findings:  truncal adiposity.   General Appearance:  No acute distress appreciable.   Well-groomed, healthy-appearing male.  Well proportioned with no abnormal fat distribution.  Good muscle tone. Pulmonary:  Normal work of breathing at rest, no respiratory distress apparent. SpO2: 97 %  Musculoskeletal:  All extremities are intact.  Neurological:  Awake, alert, oriented, and engaged.  No obvious focal neurological deficits or cognitive impairments.  Sensorium seems unclouded.   Speech is clear and coherent with logical content. Psychiatric:  Appropriate mood, pleasant and cooperative demeanor, thoughtful  and engaged during the exam  Results   LABS Sedimentation rate: elevated (June 2024) Hepatitis A immunity: immune (June 2024) Hepatitis B immunity: immune (June 2024) Liver enzymes: normalized (June 2024) Anemia: corrected (June 2024) White blood cell count: decreasing (June 2024) Testosterone: normalized (June 2024) Sex hormone-binding globulin: increased (June 2024) Vitamin D: deficient (June 2024)  RADIOLOGY X-ray: arthritis in back (June 2024)        No results found for any visits on 04/13/23.  Office Visit on 12/14/2022  Component Date Value   WBC 12/14/2022 3.6 (L)    RBC 12/14/2022 4.41    Hemoglobin 12/14/2022 13.3    HCT 12/14/2022 39.7    MCV 12/14/2022 90.1    MCHC 12/14/2022 33.4    RDW 12/14/2022 13.5    Platelets 12/14/2022 170.0    Neutrophils Relative % 12/14/2022 52.3    Lymphocytes Relative 12/14/2022 33.7    Monocytes Relative 12/14/2022 11.3    Eosinophils Relative 12/14/2022 2.2    Basophils Relative 12/14/2022 0.5    Neutro Abs 12/14/2022 1.9    Lymphs Abs 12/14/2022 1.2    Monocytes Absolute 12/14/2022 0.4    Eosinophils Absolute 12/14/2022 0.1    Basophils Absolute 12/14/2022 0.0    Sodium 12/14/2022 137    Potassium 12/14/2022 4.3    Chloride 12/14/2022 101    CO2 12/14/2022 29    Glucose, Bld 12/14/2022 93    BUN 12/14/2022 13    Creatinine, Ser 12/14/2022 0.61    Total Bilirubin 12/14/2022 0.4    Alkaline Phosphatase 12/14/2022 120 (H)    AST 12/14/2022 32    ALT 12/14/2022 22    Total Protein 12/14/2022 7.7    Albumin 12/14/2022 4.0    GFR 12/14/2022 120.94    Calcium 12/14/2022 9.4    AFP-Tumor Marker 12/14/2022 2.5    Hep B S Ab 12/14/2022 REACTIVE (A)    Hepatitis A AB,Total 12/14/2022 REACTIVE (A)    Sed Rate 12/14/2022 40 (H)    CRP 12/14/2022 <1.0   Admission on 11/23/2022, Discharged on 11/23/2022  Component Date Value   SURGICAL PATHOLOGY 11/23/2022                     Value:SURGICAL PATHOLOGY CASE:  [id number] PATIENT: John Maxwell Surgical Pathology Report     Clinical History: 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 STOMACH, BIOPSY:      Gastric antral mucosa with reactive gastropathy.      Gastric oxyntic mucosa with no significant diagnostic alteration.      No H. pylori identified on HE.      Negative for intestinal metaplasia or dysplasia.   GROSS DESCRIPTION:  Received in formalin are tan, soft tissue fragments that are submitted in toto. Number: 5 size: 0.2-0.4 cm blocks: 1 (GRP 11/15/2022)   Final Diagnosis performed by Lance Coon, MD.   Electronically signed 11/24/2022 Technical component performed at [facility], [address], [place], [place] [address].  Professional component performed at [employer], [address] [place], [place] [address].  Immunohistochemistry Technical component (if applicable) was performed  at [facility]. [address], [place], [place] [address].   IMMUNOHISTOCHEMISTRY DISCLAIMER (if applicable): Some of these immunohistochemical stains may have been developed and the performance characteristics determine by [facility]. Some may not have been cleared or approved by the U.S. Food and Drug Administration. The FDA has determined that such clearance or approval is not necessary. This test is used for clinical purposes. It should not be regarded as investigational or for research. This laboratory is certified under the Clinical Laboratory Improvement Amendments of 1988 (CLIA-88) as qualified to perform high complexity clinical laboratory testing.  The controls stained appropriately.   Office Visit on 11/02/2022  Component Date Value   Rheumatoid fact SerPl-aC* 11/02/2022 <10    Cyclic Citrullin Peptide* 11/02/2022 <16    Testosterone 11/02/2022 362.20    VITD  11/02/2022 12.47 (L)    Sed Rate 11/02/2022 29 (H)    CRP 11/02/2022 <1.0    Anti Nuclear Antibody (A* 11/02/2022 POSITIVE (A)    Total CK 11/02/2022 119    Sodium 11/02/2022 137    Potassium 11/02/2022 4.1    Chloride 11/02/2022 101    CO2 11/02/2022 28    Glucose, Bld 11/02/2022 97    BUN 11/02/2022 10    Creatinine, Ser 11/02/2022 0.61    Total Bilirubin 11/02/2022 0.9    Alkaline Phosphatase 11/02/2022 105    AST 11/02/2022 37    ALT 11/02/2022 20    Total Protein 11/02/2022 6.8    Albumin 11/02/2022 3.6    GFR 11/02/2022 121.04    Calcium 11/02/2022 9.3    Testosterone 11/02/2022 649    Testosterone, Free 11/02/2022 2.1 (L)    Sex Hormone Binding 11/02/2022 139.0 (H)    WBC 11/02/2022 5.2    RBC 11/02/2022 3.91 (L)    Hemoglobin 11/02/2022 12.3 (L)    HCT 11/02/2022 37.4 (L)    MCV 11/02/2022 95.5    MCHC 11/02/2022 33.0    RDW 11/02/2022 15.5    Platelets 11/02/2022 221.0    Neutrophils Relative % 11/02/2022 46.5    Lymphocytes Relative 11/02/2022 36.5    Monocytes Relative 11/02/2022 14.3 (H)    Eosinophils Relative 11/02/2022 1.9    Basophils Relative 11/02/2022 0.8    Neutro Abs 11/02/2022 2.4    Lymphs Abs 11/02/2022 1.9    Monocytes Absolute 11/02/2022 0.7    Eosinophils Absolute 11/02/2022 0.1    Basophils Absolute 11/02/2022 0.0    ANA Titer 1 11/02/2022 1:40 (H)    ANA Pattern 1 11/02/2022 Mitotic, Spindle Fibers (A)    ANA TITER 11/02/2022 1:40 (H)    ANA PATTERN 11/02/2022 Cytoplasmic (A)    ANA TITER 11/02/2022 1:40 (H)    ANA PATTERN 11/02/2022 Nuclear, Fine Speckled (A)   Scanned Document on 09/07/2022  Component Date Value   EGFR 09/02/2022 123.0    INR 09/02/2022 1.10   Hospital Outpatient Visit on 09/03/2022  Component Date Value   Fluid Type-FCT 09/03/2022 PERITONEAL    Color, Fluid 09/03/2022 YELLOW    Appearance, Fluid 09/03/2022 HAZY (A)    Total Nucleated Cell Cou* 09/03/2022 210    Neutrophil Count, Fluid 09/03/2022 7    Lymphs,  Fluid 09/03/2022 32    Monocyte-Macrophage-Sero* 09/03/2022 61    Eos, Fluid 09/03/2022 0    Other Cells, Fluid 09/03/2022 OTHER CELLS IDENTIFIED AS MESOTHELIAL CELLS    Albumin, Fluid 09/03/2022 <1.5    Fluid Type-FALB 09/03/2022 PERITONEAL    Path Review 09/03/2022 NEGATIVE FOR MALIGNANCY.  BENIGN MESOTHELIAL CELLS AND CHRONIC INFLAMMATORY CELLS   Admission on 08/21/2022, Discharged on 08/25/2022  Component Date Value   Sodium 08/21/2022 127 (L)    Potassium 08/21/2022 3.1 (L)    Chloride 08/21/2022 87 (L)    CO2 08/21/2022 30    Glucose, Bld 08/21/2022 100 (H)    BUN 08/21/2022 9    Creatinine, Ser 08/21/2022 0.53 (L)    Calcium 08/21/2022 7.9 (L)    Total Protein 08/21/2022 6.7    Albumin 08/21/2022 2.5 (L)    AST 08/21/2022 257 (H)    ALT 08/21/2022 73 (H)    Alkaline Phosphatase 08/21/2022 232 (H)    Total Bilirubin 08/21/2022 7.5 (H)    GFR, Estimated 08/21/2022 >60    Anion gap 08/21/2022 10    Alcohol, Ethyl (B) 08/21/2022 <10    WBC 08/21/2022 13.3 (H)    RBC 08/21/2022 3.00 (L)    Hemoglobin 08/21/2022 12.0 (L)    HCT 08/21/2022 32.5 (L)    MCV 08/21/2022 108.3 (H)    MCH 08/21/2022 40.0 (H)    MCHC 08/21/2022 36.9 (H)    RDW 08/21/2022 15.3    Platelets 08/21/2022 123 (L)    nRBC 08/21/2022 0.0    Opiates 08/21/2022 NONE DETECTED    Cocaine 08/21/2022 NONE DETECTED    Benzodiazepines 08/21/2022 NONE DETECTED    Amphetamines 08/21/2022 NONE DETECTED    Tetrahydrocannabinol 08/21/2022 NONE DETECTED    Barbiturates 08/21/2022 NONE DETECTED    Lipase 08/21/2022 39    Prothrombin Time 08/21/2022 16.1 (H)    INR 08/21/2022 1.3 (H)    Ammonia 08/21/2022 28    Sodium, Ur 08/21/2022 <10    Chloride Urine 08/21/2022 19    HIV Screen 4th Generatio* 08/21/2022 Non Reactive    Osmolality 08/21/2022 283    Vitamin B-12 08/21/2022 452    Folate 08/21/2022 1.6 (L)    Osmolality, Ur 08/21/2022 776    WBC 08/22/2022 9.4    RBC 08/22/2022 2.95 (L)    Hemoglobin  08/22/2022 11.6 (L)    HCT 08/22/2022 32.9 (L)    MCV 08/22/2022 111.5 (H)    MCH 08/22/2022 39.3 (H)    MCHC 08/22/2022 35.3    RDW 08/22/2022 15.9 (H)    Platelets 08/22/2022 105 (L)    nRBC 08/22/2022 0.2    Sodium 08/22/2022 126 (L)    Potassium 08/22/2022 3.1 (L)    Chloride 08/22/2022 89 (L)    CO2 08/22/2022 31    Glucose, Bld 08/22/2022 104 (H)    BUN 08/22/2022 12    Creatinine, Ser 08/22/2022 0.61    Calcium 08/22/2022 7.7 (L)    Total Protein 08/22/2022 6.4 (L)    Albumin 08/22/2022 2.5 (L)    AST 08/22/2022 186 (H)    ALT 08/22/2022 60 (H)    Alkaline Phosphatase 08/22/2022 208 (H)    Total Bilirubin 08/22/2022 6.3 (H)    GFR, Estimated 08/22/2022 >60    Anion gap 08/22/2022 6    Hepatitis B Surface Ag 08/23/2022 NON REACTIVE    HCV Ab 08/23/2022 NON REACTIVE    Hep A IgM 08/23/2022 NON REACTIVE    Hep B C IgM 08/23/2022 NON REACTIVE    Ammonia 08/23/2022 53 (H)    Sodium 08/23/2022 130 (L)    Potassium 08/23/2022 3.4 (L)    Chloride 08/23/2022 91 (L)    CO2 08/23/2022 32    Glucose, Bld 08/23/2022 111 (H)    BUN 08/23/2022 10    Creatinine, Ser  08/23/2022 0.59 (L)    Calcium 08/23/2022 8.0 (L)    Total Protein 08/23/2022 6.1 (L)    Albumin 08/23/2022 2.4 (L)    AST 08/23/2022 155 (H)    ALT 08/23/2022 54 (H)    Alkaline Phosphatase 08/23/2022 189 (H)    Total Bilirubin 08/23/2022 4.3 (H)    GFR, Estimated 08/23/2022 >60    Anion gap 08/23/2022 7    Prothrombin Time 08/23/2022 16.0 (H)    INR 08/23/2022 1.3 (H)    WBC 08/23/2022 8.4    RBC 08/23/2022 2.78 (L)    Hemoglobin 08/23/2022 11.1 (L)    HCT 08/23/2022 31.9 (L)    MCV 08/23/2022 114.7 (H)    MCH 08/23/2022 39.9 (H)    MCHC 08/23/2022 34.8    RDW 08/23/2022 16.0 (H)    Platelets 08/23/2022 113 (L)    nRBC 08/23/2022 0.0    CYTOLOGY - NON GYN 08/23/2022                     Value:CYTOLOGY - NON PAP CASE: [id number] PATIENT: John Maxwell Non-Gynecological Cytology  Report     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 ASCITES, PARACENTESIS:   FINAL MICROSCOPIC DIAGNOSIS: - No malignant cells identified - Numerous benign mesothelial cells with scattered lymphocytes within a serous background  SPECIMEN ADEQUACY: Satisfactory for evaluation  GROSS: Received is/are 1000 ccs of yellow fluid. (CM:cm) Prepared: Smears:  0 Concentration Method (Thin Prep):  1 Cell Block:  1 Additional Studies:  n/a     Final Diagnosis performed by Jerene Bears, MD.   Electronically signed 08/25/2022 Technical component performed at [facility], [address], [place], [place] [address].  Professional component performed at [employer], [address] [place], [place] [address].  Immunohistochemistry Technical component (if applicable) was performed at [facility]. [address], [place], [place] [address].   IMMUNOHISTOCHEMISTRY DISCLAIMER (if applicable): Some of these immunohistochemical stains may have been developed and the performance characteristics determine by [facility]. Some may not have been cleared or approved by the U.S. Food and Drug Administration. The FDA has determined that such clearance or approval is not necessary. This test is used for clinical purposes. It should not be regarded as investigational or for research. This laboratory is certified under the Clinical Laboratory Improvement Amendments of 1988 (CLIA-88) as qualified to perform high complexity clinical laboratory testing.  The controls stained appropriately.    Fluid Type-FCT 08/23/2022 CYTO PERI    Color, Fluid 08/23/2022 YELLOW (A)    Appearance, Fluid 08/23/2022 CLEAR    Total Nucleated Cell Cou* 08/23/2022 173    Neutrophil Count, Fluid 08/23/2022 7    Lymphs, Fluid 08/23/2022 7    Monocyte-Macrophage-Sero*  08/23/2022 86    Eos, Fluid 08/23/2022 0    Albumin, Fluid 08/23/2022 <1.5    Fluid Type-FALB 08/23/2022 CYTO PERI    WBC 08/24/2022 8.7    RBC 08/24/2022 3.09 (L)    Hemoglobin 08/24/2022 12.7 (L)    HCT 08/24/2022 36.3 (L)    MCV 08/24/2022 117.5 (H)    MCH 08/24/2022 41.1 (H)    MCHC 08/24/2022 35.0    RDW 08/24/2022 16.9 (H)    Platelets 08/24/2022 121 (L)    nRBC 08/24/2022 0.2  Sodium 08/24/2022 131 (L)    Potassium 08/24/2022 3.6    Chloride 08/24/2022 94 (L)    CO2 08/24/2022 31    Glucose, Bld 08/24/2022 111 (H)    BUN 08/24/2022 9    Creatinine, Ser 08/24/2022 0.67    Calcium 08/24/2022 8.0 (L)    Total Protein 08/24/2022 6.5    Albumin 08/24/2022 2.6 (L)    AST 08/24/2022 139 (H)    ALT 08/24/2022 54 (H)    Alkaline Phosphatase 08/24/2022 187 (H)    Total Bilirubin 08/24/2022 4.5 (H)    GFR, Estimated 08/24/2022 >60    Anion gap 08/24/2022 6    Prothrombin Time 08/24/2022 15.6 (H)    INR 08/24/2022 1.3 (H)   Office Visit on 08/19/2022  Component Date Value   AFP-Tumor Marker 08/19/2022 4.2    Cholesterol 08/19/2022 128    Triglycerides 08/19/2022 96.0    HDL 08/19/2022 27.70 (L)    VLDL 08/19/2022 19.2    LDL Cholesterol 08/19/2022 81    Total CHOL/HDL Ratio 08/19/2022 5    NonHDL 08/19/2022 99.98    TSH 08/19/2022 4.95    Hgb A1c MFr Bld 08/19/2022 4.3 (L)    Sodium 08/19/2022 134 (L)    Potassium 08/19/2022 3.4 (L)    Chloride 08/19/2022 87 (L)    CO2 08/19/2022 33 (H)    Glucose, Bld 08/19/2022 95    BUN 08/19/2022 6    Creatinine, Ser 08/19/2022 0.71    Total Bilirubin 08/19/2022 3.6 (H)    Alkaline Phosphatase 08/19/2022 295 (H)    AST 08/19/2022 232 (H)    ALT 08/19/2022 56 (H)    Total Protein 08/19/2022 6.5    Albumin 08/19/2022 3.0 (L)    GFR 08/19/2022 115.78    Calcium 08/19/2022 8.2 (L)    WBC 08/19/2022 9.3    RBC 08/19/2022 3.14 (L)    Platelets 08/19/2022 150.0    Hemoglobin 08/19/2022 12.7 (L)    HCT 08/19/2022 36.9 (L)     MCV 08/19/2022 117.5 Repeated and verified X2. (H)    MCHC 08/19/2022 34.5    RDW 08/19/2022 15.7 (H)   Admission on 07/31/2022, Discharged on 07/31/2022  Component Date Value   WBC 07/31/2022 8.3    RBC 07/31/2022 3.43 (L)    Hemoglobin 07/31/2022 13.3    HCT 07/31/2022 36.9 (L)    MCV 07/31/2022 107.6 (H)    MCH 07/31/2022 38.8 (H)    MCHC 07/31/2022 36.0    RDW 07/31/2022 16.4 (H)    Platelets 07/31/2022 128 (L)    nRBC 07/31/2022 0.0    Neutrophils Relative % 07/31/2022 75    Neutro Abs 07/31/2022 6.3    Lymphocytes Relative 07/31/2022 13    Lymphs Abs 07/31/2022 1.1    Monocytes Relative 07/31/2022 10    Monocytes Absolute 07/31/2022 0.8    Eosinophils Relative 07/31/2022 0    Eosinophils Absolute 07/31/2022 0.0    Basophils Relative 07/31/2022 1    Basophils Absolute 07/31/2022 0.1    Immature Granulocytes 07/31/2022 1    Abs Immature Granulocytes 07/31/2022 0.05    Color, Urine 07/31/2022 AMBER (A)    APPearance 07/31/2022 CLEAR    Specific Gravity, Urine 07/31/2022 1.020    pH 07/31/2022 6.0    Glucose, UA 07/31/2022 NEGATIVE    Hgb urine dipstick 07/31/2022 NEGATIVE    Bilirubin Urine 07/31/2022 MODERATE (A)    Ketones, ur 07/31/2022 NEGATIVE    Protein, ur 07/31/2022 30 (A)    Nitrite 07/31/2022 NEGATIVE  Leukocytes,Ua 07/31/2022 NEGATIVE    RBC / HPF 07/31/2022 0-5    WBC, UA 07/31/2022 0-5    Bacteria, UA 07/31/2022 MANY (A)    Squamous Epithelial / HPF 07/31/2022 0-5    Sodium 07/31/2022 132 (L)    Potassium 07/31/2022 3.4 (L)    Chloride 07/31/2022 95 (L)    CO2 07/31/2022 28    Glucose, Bld 07/31/2022 137 (H)    BUN 07/31/2022 <5 (L)    Creatinine, Ser 07/31/2022 0.59 (L)    Calcium 07/31/2022 7.6 (L)    Total Protein 07/31/2022 6.9    Albumin 07/31/2022 2.8 (L)    AST 07/31/2022 175 (H)    ALT 07/31/2022 37    Alkaline Phosphatase 07/31/2022 245 (H)    Total Bilirubin 07/31/2022 2.6 (H)    GFR, Estimated 07/31/2022 >60    Anion gap 07/31/2022  9    Lipase 07/31/2022 23    No image results found.   US Abdomen Complete  Result Date: 04/02/2023 CLINICAL DATA:  Cirrhosis EXAM: ABDOMEN ULTRASOUND COMPLETE COMPARISON:  MR abdomen 08/22/2022 FINDINGS: Gallbladder: No gallstones or wall thickening visualized. No sonographic Murphy sign noted by sonographer. Common bile duct: Diameter: 6.8 mm, prominent Liver: Coarsened echogenicity and nodular contour. No focal lesion. Portal vein is patent on color Doppler imaging with normal direction of blood flow towards the liver. IVC: No abnormality visualized. Pancreas: Visualized portion unremarkable. Spleen: Size and appearance within normal limits. Right Kidney: Length: 10.4 cm. Echogenicity within normal limits. No mass or hydronephrosis visualized. Left Kidney: Length: 11.6 cm. Echogenicity within normal limits. No mass or hydronephrosis visualized. Abdominal aorta: No aneurysm visualized. Other findings: None. IMPRESSION: 1. Cirrhotic morphology of the liver. No focal lesion. 2. Common bile duct is prominent measuring 6.8 mm. Recommend correlation with LFTs. Electronically Signed   By: Annia Belt M.D.   On: 04/02/2023 20:02    US Abdomen Complete  Result Date: 04/02/2023 CLINICAL DATA:  Cirrhosis EXAM: ABDOMEN ULTRASOUND COMPLETE COMPARISON:  MR abdomen 08/22/2022 FINDINGS: Gallbladder: No gallstones or wall thickening visualized. No sonographic Murphy sign noted by sonographer. Common bile duct: Diameter: 6.8 mm, prominent Liver: Coarsened echogenicity and nodular contour. No focal lesion. Portal vein is patent on color Doppler imaging with normal direction of blood flow towards the liver. IVC: No abnormality visualized. Pancreas: Visualized portion unremarkable. Spleen: Size and appearance within normal limits. Right Kidney: Length: 10.4 cm. Echogenicity within normal limits. No mass or hydronephrosis visualized. Left Kidney: Length: 11.6 cm. Echogenicity within normal limits. No mass or hydronephrosis  visualized. Abdominal aorta: No aneurysm visualized. Other findings: None. IMPRESSION: 1. Cirrhotic morphology of the liver. No focal lesion. 2. Common bile duct is prominent measuring 6.8 mm. Recommend correlation with LFTs. Electronically Signed   By: Annia Belt M.D.   On: 04/02/2023 20:02       Additional Info: This encounter employed real-time, collaborative documentation. The patient actively reviewed and updated their medical record on a shared screen, ensuring transparency and facilitating joint problem-solving for the problem list, overview, and plan. This approach promotes accurate, informed care. The treatment plan was discussed and reviewed in detail, including medication safety, potential side effects, and all patient questions. We confirmed understanding and comfort with the plan. Follow-up instructions were established, including contacting the office for any concerns, returning if symptoms worsen, persist, or new symptoms develop, and precautions for potential emergency department visits.

## 2023-04-14 LAB — ANA W/REFLEX IF POSITIVE
Anti JO-1: <0.2 AI (ref 0.0–0.9)
Anti Nuclear Antibody (ANA): POSITIVE — AB
Centromere Ab Screen: <0.2 AI (ref 0.0–0.9)
Chromatin Ab SerPl-aCnc: <0.2 AI (ref 0.0–0.9)
ENA RNP Ab: 0.2 AI (ref 0.0–0.9)
ENA SM Ab Ser-aCnc: <0.2 AI (ref 0.0–0.9)
ENA SSA (RO) Ab: <0.2 AI (ref 0.0–0.9)
ENA SSB (LA) Ab: <0.2 AI (ref 0.0–0.9)
Scleroderma (Scl-70) (ENA) Antibody, IgG: <0.2 AI (ref 0.0–0.9)
dsDNA Ab: 11 [IU]/mL — ABNORMAL HIGH (ref 0–9)

## 2023-04-14 LAB — LIPID PANEL W/REFLEX DIRECT LDL
Cholesterol: 185 mg/dL (ref <200)
HDL: 74 mg/dL (ref >=40)
LDL Cholesterol (Calc): 95 mg/dL
Non-HDL Cholesterol (Calc): 111 mg/dL (ref <130)
Total CHOL/HDL Ratio: 2.5 (calc) (ref <5.0)
Triglycerides: 72 mg/dL (ref <150)

## 2023-04-14 LAB — TSH RFX ON ABNORMAL TO FREE T4: TSH: 1.33 u[IU]/mL (ref 0.450–4.500)

## 2023-04-17 ENCOUNTER — Encounter: Payer: Self-pay | Admitting: Internal Medicine

## 2023-04-17 DIAGNOSIS — Q789 Osteochondrodysplasia, unspecified: Secondary | ICD-10-CM | POA: Insufficient documentation

## 2023-04-17 DIAGNOSIS — D8989 Other specified disorders involving the immune mechanism, not elsewhere classified: Secondary | ICD-10-CM | POA: Insufficient documentation

## 2023-04-17 MED ORDER — VITAMIN D (ERGOCALCIFEROL) 1.25 MG (50000 UNIT) PO CAPS
50000.0000 [IU] | ORAL_CAPSULE | ORAL | 1 refills | Status: AC
Start: 2023-04-17 — End: ?

## 2023-04-17 NOTE — Progress Notes (Signed)
Labs reviewed. Liver function improving. Vitamin D still low. Mild anemia persists. Some abnormal immune markers noted. Overall, findings suggest bone/joint symptoms likely related to liver disease effects rather than separate arthritis. Continue current treatments. Increase vitamin D. Follow up in 3 months with repeat labs. Contact if symptoms worsen.

## 2023-04-17 NOTE — Addendum Note (Signed)
Addended by: Lula Olszewski on: 04/17/2023 08:04 PM   Modules accepted: Orders

## 2023-04-19 NOTE — Progress Notes (Deleted)
   Rubin Payor, PhD, LAT, ATC acting as a scribe for Clementeen Graham, MD.  John Maxwell is a 42 y.o. male who presents to [facility] today for L shoulder pain x ***. Pt locates pain to ***  Radiates: Aggravates: Treatments tried:  Pt also c/o L elbow pain x ***. Pt locates pain to ***  Radiates: Paresthesia: Grip strength: Aggravates: Treatments tried:  Dx testing: 04/13/23 Labs  Pertinent review of systems: ***  Relevant historical information: ***   Exam:  There were no vitals taken for this visit. General: Well Developed, well nourished, and in no acute distress.   MSK: ***    Lab and Radiology Results No results found for this or any previous visit (from the past 72 hour(s)). No results found.     Assessment and Plan: 42 y.o. male with ***   PDMP not reviewed this encounter. No orders of the defined types were placed in this encounter.  No orders of the defined types were placed in this encounter.    Discussed warning signs or symptoms. Please see discharge instructions. Patient expresses understanding.   ***

## 2023-04-20 ENCOUNTER — Ambulatory Visit: Payer: BC Managed Care – PPO | Admitting: Family Medicine

## 2023-04-26 NOTE — Telephone Encounter (Signed)
Patient's review of lab results/notes and letter confirmed. Scheduled for a follow-up visit on 12/2.

## 2023-05-30 ENCOUNTER — Encounter: Payer: Self-pay | Admitting: Internal Medicine

## 2023-05-30 ENCOUNTER — Ambulatory Visit (INDEPENDENT_AMBULATORY_CARE_PROVIDER_SITE_OTHER): Payer: BC Managed Care – PPO | Admitting: Internal Medicine

## 2023-05-30 VITALS — BP 114/64 | HR 80 | Temp 99.2°F | Ht 67.0 in | Wt 151.8 lb

## 2023-05-30 DIAGNOSIS — F1090 Alcohol use, unspecified, uncomplicated: Secondary | ICD-10-CM

## 2023-05-30 DIAGNOSIS — K7030 Alcoholic cirrhosis of liver without ascites: Secondary | ICD-10-CM

## 2023-05-30 DIAGNOSIS — R768 Other specified abnormal immunological findings in serum: Secondary | ICD-10-CM

## 2023-05-30 DIAGNOSIS — K766 Portal hypertension: Secondary | ICD-10-CM

## 2023-05-30 DIAGNOSIS — N529 Male erectile dysfunction, unspecified: Secondary | ICD-10-CM | POA: Diagnosis not present

## 2023-05-30 DIAGNOSIS — E538 Deficiency of other specified B group vitamins: Secondary | ICD-10-CM

## 2023-05-30 DIAGNOSIS — D8989 Other specified disorders involving the immune mechanism, not elsewhere classified: Secondary | ICD-10-CM

## 2023-05-30 DIAGNOSIS — R61 Generalized hyperhidrosis: Secondary | ICD-10-CM

## 2023-05-30 DIAGNOSIS — F4310 Post-traumatic stress disorder, unspecified: Secondary | ICD-10-CM

## 2023-05-30 DIAGNOSIS — R7989 Other specified abnormal findings of blood chemistry: Secondary | ICD-10-CM

## 2023-05-30 DIAGNOSIS — G2581 Restless legs syndrome: Secondary | ICD-10-CM

## 2023-05-30 DIAGNOSIS — E559 Vitamin D deficiency, unspecified: Secondary | ICD-10-CM

## 2023-05-30 DIAGNOSIS — D538 Other specified nutritional anemias: Secondary | ICD-10-CM

## 2023-05-30 DIAGNOSIS — T50905A Adverse effect of unspecified drugs, medicaments and biological substances, initial encounter: Secondary | ICD-10-CM

## 2023-05-30 DIAGNOSIS — R5383 Other fatigue: Secondary | ICD-10-CM

## 2023-05-30 NOTE — Assessment & Plan Note (Signed)
Anemia   His anemia has stabilized with vitamin supplementation and cessation of alcohol. Hemoglobin levels are near normal. There is no immediate need for rechecking hemoglobin levels.

## 2023-05-30 NOTE — Assessment & Plan Note (Signed)
Vitamin D Deficiency   His Vitamin D levels have been low. He is currently taking a supplement. We need to double the supplementation as previous levels only slightly improved. We will recheck Vitamin D levels in a few months.

## 2023-05-30 NOTE — Assessment & Plan Note (Signed)
Alcohol Use Disorder in Remission   He has maintained sobriety for nearly a year without relapses or cravings. Continued abstinence is crucial to avoid triggering liver failure. We discussed the importance of ongoing support and monitoring. We will continue acamprosate and gabapentin, use the I Am Sober app, monitor for signs of relapse or cravings, and follow up in three months.

## 2023-05-30 NOTE — Assessment & Plan Note (Addendum)
Following with rheumatology Had associated with widespread pain/fatigue/sleep disturbances- mostly resolved so we will defer further workup at this time

## 2023-05-30 NOTE — Assessment & Plan Note (Signed)
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 Continued abstinence from alcohol is essential to prevent further liver damage. We explained that cirrhosis does not resolve, but liver function can remain stable with abstinence. We will discontinue Lasix and spironolactone unless fluid retention recurs and monitor liver function periodically.

## 2023-05-30 NOTE — Assessment & Plan Note (Signed)
Low Testosterone   His low testosterone levels are likely due to liver disease. He is interested in testosterone replacement therapy (TRT) but needs further evaluation. We explained that TRT can worsen PTSD and should be considered after PTSD treatment. We discussed a referral to endocrinology for evaluation and consider checking free testosterone levels in the future.

## 2023-05-30 NOTE — Patient Instructions (Addendum)
PTSD treatment(s) options with Eye Movement Desensitization and Reprocessing (EMDR):  [facility] Address: [address], [place], [place], [address] Phone: [phone] Services: Individual Counseling, Family Therapy, Couples Counseling, Trauma Therapy, Stress Management, Depression Treatment, Anxiety Treatment, and Grief Counseling   [facility], [facility] Address: [address] [place], [place], [place], [address] Phone: [phone] Specializes in EMDR, PTSD, anxiety, cognitive behavioral therapy, and other disorders   [facility] Address: [address] [place], [place], [place], [address] Phone: [phone] Offers professional counseling specializing in Complex Trauma and Dissociative Disorders, mood disorders, personality disorders, and EMDR 3.  [facility] Address: [address], [place], [place], [address] Phone: [phone] Specializes in trauma, addiction, and perinatal therapy services 4.  [facility] [address] [place], [place] [address] [phone]   VISIT SUMMARY:  During today's visit, we reviewed your progress in several areas, including your continued sobriety, liver function, PTSD, sleep disturbances, and other health concerns. You have been doing well with your sobriety and liver function has improved. We discussed your sleep issues, low testosterone symptoms, and the possibility of discontinuing some medications. We also talked about the benefits of EMDR therapy for your PTSD and the need for further evaluation of your testosterone levels.  YOUR PLAN:  -ALCOHOL USE DISORDER IN REMISSION: You have maintained sobriety for nearly a year, which is crucial for your liver health. We will continue your current medications (acamprosate and gabapentin), use the I Am Sober app, and monitor for any signs of relapse or cravings. Follow-up in three months.  -CIRRHOSIS: Cirrhosis  is a condition where the liver is scarred and its function is impaired. Your liver function has improved, and we will discontinue Lasix and spironolactone unless fluid retention recurs. Continued abstinence from alcohol is essential to prevent further liver damage. We will monitor your liver function periodically.  -POST-TRAUMATIC STRESS DISORDER (PTSD): PTSD is a mental health condition triggered by a traumatic event. We discussed the benefits of EMDR therapy, which can improve sleep, reduce the risk of alcohol relapse, and enhance overall mental health. We will provide a listing of EMDR therapists and recommend starting therapy.  -LOW TESTOSTERONE: Low testosterone can cause symptoms like erectile dysfunction and is likely due to your liver disease. We discussed the possibility of testosterone replacement therapy (TRT) but noted that it should be considered after PTSD treatment. We will refer you to an endocrinologist for further evaluation.  -ANEMIA: Anemia is a condition where you don't have enough healthy red blood cells. Your anemia has stabilized with vitamin supplementation and stopping alcohol. There is no immediate need to recheck your hemoglobin levels.  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 You will continue to follow up with rheumatology as scheduled, and there is no need to repeat the autoimmune panel today.  -VITAMIN D DEFICIENCY: Vitamin D deficiency means you have low levels of Vitamin D. We need to double your current supplementation as previous levels only slightly improved. We will recheck your Vitamin D levels in a few months.  -GENERAL HEALTH MAINTENANCE: You are doing well overall with no new medical problems reported. We will continue your current health maintenance practices and follow up with dermatology as scheduled.  INSTRUCTIONS:  We will schedule a follow-up appointment in three months and  perform fasting labs including cholesterol and testosterone at the next visit.

## 2023-05-30 NOTE — Progress Notes (Signed)
[facility]: [phone]   -- Medical Office Visit --  Patient:  John Maxwell      Age: 42 y.o.       Sex:  male  Date:   05/30/2023 Today's Healthcare Provider: Lula Olszewski, MD  ==========================================================================     Assessment & Plan Alcohol use disorder Alcohol Use Disorder in Remission   He has maintained sobriety for nearly a year without relapses or cravings. Continued abstinence is crucial to avoid triggering liver failure. We discussed the importance of ongoing support and monitoring. We will continue acamprosate and gabapentin, use the I Am Sober app, monitor for signs of relapse or cravings, and follow up in three months. ANA positive Following with rheumatology Had associated with widespread pain/fatigue/sleep disturbances- mostly resolved so we will defer further workup at this time  Autoimmune disorder (HCC) Since symptoms resolved- will defer further rheum workup at this time Drug-induced hypokalemia  Erectile dysfunction, unspecified erectile dysfunction type  Folate deficiency  Low testosterone in male Low Testosterone   His low testosterone levels are likely due to liver disease. He is interested in testosterone replacement therapy (TRT) but needs further evaluation. We explained that TRT can worsen PTSD and should be considered after PTSD treatment. We discussed a referral to endocrinology for evaluation and consider checking free testosterone levels in the future. Night sweats  Other fatigue  Portal hypertension (HCC)  Restless legs  PTSD (post-traumatic stress disorder) Post-Traumatic Stress Disorder (PTSD)   His service-connected PTSD likely contributed to his alcohol use disorder. He has not tried EMDR therapy. We explained that EMDR is highly effective for PTSD and can improve sleep, reduce the risk of alcohol relapse, and enhance overall mental health. We will provide  a listing of EMDR therapists, recommend starting EMDR therapy, and monitor for improvements in PTSD symptoms and overall mental health. Alcoholic cirrhosis, unspecified whether ascites present (HCC) Cirrhosis  likely caused by alcohol hepatitis He has partial cirrhosis with improved liver function. Continued abstinence from alcohol is essential to prevent further liver damage. We explained that cirrhosis does not resolve, but liver function can remain stable with abstinence. We will discontinue Lasix and spironolactone unless fluid retention recurs and monitor liver function periodically. Other specified nutritional anemias Anemia   His anemia has stabilized with vitamin supplementation and cessation of alcohol. Hemoglobin levels are near normal. There is no immediate need for rechecking hemoglobin levels. Vitamin D deficiency Vitamin D Deficiency   His Vitamin D levels have been low. He is currently taking a supplement. We need to double the supplementation as previous levels only slightly improved. We will recheck Vitamin D levels in a few months.     Orders Placed During this Encounter:   ED Discharge Orders          Ordered    Ambulatory referral to Endocrinology       Comments: Testosterone Levels:  04/13/2023: 328.34 ng/dL 52/84/1324: 401.02 and 649 ng/dL (discrepancy noted) SHBG: 139.0 (High)  Recommended Additional Tests:  Free Testosterone - Critical given high SHBG LH (Luteinizing Hormone) Prolactin (PRL) Total Testosterone confirmation Potential Testosterone Replacement Therapy (TRT) Considerations:  Low total testosterone (below typical young male range when considering elevated shbg) High SHBG may impact free testosterone availability Complicating factors: liver cirrhosis, potential impact on hormone metabolism Recommendation: Comprehensive endocrinology evaluation to:  Clarify free testosterone levels Assess TRT candidacy Consider liver and overall health  implications Evaluate underlying causes of low testosterone/high shbg and erectile dysfunction (ED)  Caution needed due to liver cirrhosis - TRT requires careful medical supervision.   05/30/23 1036          Diagnoses and all orders for this visit: Alcohol use disorder ANA positive Autoimmune disorder (HCC) Drug-induced hypokalemia Erectile dysfunction, unspecified erectile dysfunction type Folate deficiency Low testosterone in male -     Ambulatory referral to Endocrinology Night sweats Other fatigue Portal hypertension (HCC) Restless legs PTSD (post-traumatic stress disorder) General Health Maintenance   He is doing well overall with no new medical problems reported. We will continue current health maintenance practices and follow up with dermatology as scheduled.  Follow-up   We will schedule a follow-up appointment in three months and perform fasting labs including cholesterol and testosterone at the next visit.  Future Appointments  Date Time Provider Department Center  08/29/2023  8:00 AM Lula Olszewski, MD [facility]  10/06/2023 11:30 AM Terri Piedra, DO [facility] None  Patient Care Team: Lula Olszewski, MD as PCP - General (Internal Medicine) Audrie Lia, MD as Referring Physician (Internal Medicine) Lynann Bologna, DO as Consulting Physician (Gastroenterology)    SUBJECTIVE: 42 y.o. male who has Alcohol use disorder; Alcoholic cirrhosis of liver (HCC); Anemia; Folate deficiency; Portal hypertension (HCC); Restless legs; Undifferentiated inflammatory arthritis (HCC); Erectile dysfunction; Other fatigue; Insomnia; ANA positive; Low testosterone in male; Reactive gastropathy; Rash; Night sweats; Vitamin D deficiency; Hepatic osteodystrophy; and Autoimmune disorder (HCC) on their problem list.  Main reasons for visit/main concerns/chief complaint: 6 week follow-up    AI-Extracted: Discussed the use of AI scribe software for clinical note transcription  with the patient, who gave verbal consent to proceed.  History of Present Illness   The patient, with a history of alcohol use disorder, liver disease, and PTSD, presents for a routine follow-up. He reports continued abstinence from alcohol, with no cravings or near relapses. He has been taking camprosate and gabapentin to support his recovery and has also found support in a non-traditional sponsor-like relationship. He has been using the "I Am Sober" app and is nearing one year of sobriety.  The patient's liver function has significantly improved since his last visit, with no signs of jaundice. He reports no joint pain, suggesting a resolution of the previous autoimmune attack.  The patient reports occasional sleep disturbances, with periods of excessive sleep and difficulty sleeping. He also mentions occasional night sweats, which he attributes to room temperature. He has been experiencing symptoms of low testosterone, including erectile dysfunction, which he is interested in discussing with an endocrinologist.  The patient is also taking Lasix and spironolactone for fluid management related to his liver disease, but he believes he may be able to discontinue these medications. He is also taking a vitamin D supplement, as previous labs showed a deficiency. He reports no current social or financial stressors threatening his recovery.       Note that patient  has a past medical history of Acute hepatic encephalopathy (HCC) (08/23/2022), Alcohol dependence with hallucinations (HCC) (08/21/2022), Alcoholic cirrhosis of liver (HCC) (08/20/2022), Alcoholic hepatitis with ascites (08/30/2022), Anxiety, Arthritis (11/02/2022), Delirium tremens (HCC) (08/20/2022), Depression, Drug-induced hypokalemia (08/21/2022), Elevated bilirubin (09/06/2022), Encephalopathy, hepatic (HCC) (08/23/2022), Hyponatremia (08/21/2022), Liver failure without hepatic coma (HCC) (08/20/2022), Liver mass, ruled out (08/20/2022),  Overweight (08/20/2022), Panic attack (08/20/2022), Paronychia of finger of right hand (12/25/2017), Substance abuse (HCC), and Thrombocytopenia (08/22/2022).  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 Other ENA antibodies negative. Clinical significance uncertain given improving liver function and low inflammatory markers.   Night Sweats   05/30/2023:  much better, room still warm sometimes  December 14, 2022 interim history:   no better, nightly sweats, ppd negative.  Esr 27 /white blood cell(s) negative. Most labwork improved.  Nov 24, 2022 interim history:   The patient also reported experiencing intense sweating upon waking up for the past week.   Ana Positive   Comprehensive Review of the Case: A 42 year old male with a history of alcohol use disorder in early remission following acute hepatitis, presenting with joint pain and weakness for several months, stiffness and pain in shoulders, knuckles, knees, and hands, episodes of jerking awake due to joint pain, low libido, fatigue, and erectile dysfunction post-liver failure. His medical history includes alcoholic cirrhosis, hyponatremia, anemia, thrombocytopenia, folate deficiency, portal hypertension, alcoholic hepatitis with ascites, restless legs, elevated bilirubin, and electrolyte abnormality. He has experienced acute hepatic encephalopathy three months prior. Current medications include acamprosate, folic acid, furosemide, gabapentin, lactulose, potassium chloride, ropinirole, spironolactone, thiamine, and trazodone. Recent labs show normalization of CBC and CMP, with platelets improving to 220 from 110. Sed rate is elevated at 29, glucose is normal at 97, serum sex hormone-binding globulin is high at 139.0, testosterone levels are within normal range but free  testosterone is low at 2.1, ANA is positive with a mitotic spindle fibers pattern at a titer of 1:40, cyclic citrullinated peptide antibody and RA latex turbidity are negative. Most Likely Diagnoses: Autoimmune Hepatitis (AIH): The presence of joint pain, positive ANA with a specific pattern, and liver disease could suggest AIH superimposed on alcoholic liver disease. AIH could explain the systemic symptoms, including fatigue and erectile dysfunction, through mechanisms of chronic inflammation and autoimmune activity. However, the absence of more specific autoantibodies typically associated with AIH and the context of significant alcohol use make this diagnosis less straightforward. Hepatic Osteodystrophy: Liver disease, particularly cirrhosis, can lead to metabolic bone diseases, including osteoporosis and osteomalacia, which might explain the diffuse joint and bone pain. The patient's history of alcohol use disorder, liver failure, and associated nutritional deficiencies (e.g., folate) could contribute to bone metabolism disorders. This condition would not directly explain the positive ANA or low free testosterone levels, suggesting a multifactorial process. 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 This condition can result from impaired metabolism of sex hormones by the liver and can contribute to fatigue and muscle weakness. The joint pain might be indirectly related through associated muscle weakness or could be part of a separate process. Expanded Differential Diagnoses: Rheumatoid Arthritis (RA): Despite the negative RA latex turbidity and cyclic citrullinated peptide antibody, RA could still be considered given the joint symptoms. However, the typical serological markers are  absent, and the patient's symptoms might be atypical for RA. The positive ANA and systemic symptoms could overlap with RA, but the diagnosis is less likely without more specific findings. Secondary Sjgren's Syndrome: The presence of a positive ANA and systemic symptoms could suggest Sjgren's syndrome, potentially secondary to another autoimmune condition or liver disease. However, the lack of typical symptoms such as dry eyes and mouth makes this diagnosis less likely. Fibromyalgia: Considering the widespread pain, fatigue, and sleep disturbances (jerking awake from pain), fibromyalgia could be  a consideration. However, fibromyalgia would not explain the abnormal laboratory findings, suggesting it could be a comorbid condition rather than the primary diagnosis.   Low Testosterone in Male   Lab Results  Component Value Date/Time   TESTOSTERONE 328.34 04/13/2023 08:37 AM   TESTOSTERONE 362.20 11/02/2022 09:03 AM   TESTOSTERONE 649 11/02/2022 09:03 AM   Lab Results  Component Value Date   SHBG 139.0 (H) 11/02/2022   No results found for: "LH", "FSH", "PROLACTIN" No results found for: "25OHVITD2" Lab Results  Component Value Date   AST 23 04/13/2023   ALT 18 04/13/2023   ALKPHOS 136 (H) 04/13/2023   BILITOT 0.6 04/13/2023   WBC 5.3 04/13/2023   HGB 12.7 (L) 04/13/2023   HCT 38.8 (L) 04/13/2023   HDL 73.20 04/13/2023   HDL 74 04/13/2023   CHOLHDL 3 04/13/2023   CHOLHDL 2.5 04/13/2023   LDLCALC 96 04/13/2023   LDLCALC 95 04/13/2023   TRIG 73.0 04/13/2023   TRIG 72 04/13/2023   HGBA1C 4.3 (L) 08/19/2022     Patient has expressed interest in testosterone replacement therapy, our approach is: [x]  Lifestyle modifications like weight management, exercise, and adequate sleep should all be tried first, if possible.  [x]  Risks and benefits: Testosterone therapy may result in improvements in erectile function, low sex drive, anemia, bone mineral density, lean body mass, and/or depressive  symptoms. The evidence is inconclusive whether testosterone therapy improves cognitive function, measures of diabetes, energy, fatigue, lipid profiles, and quality of life measures.  It is not a "cure-all" and may not completely resolve all symptoms.  Replacing testosterone will not extend your life, and it carries significant risks. Testosterone replacement therapy carries increased risk of sleep apnea, rising red blood cell count, cardiovascular disease (evidence lacking), and prostate cancer (evidence lacking).  Testosterone therapy can decrease sperm production so you should discuss options for sperm banking if fertility is a concern.  Internal testosterone and sperm production is suppressed and may not return [x]  Follow up appointment requirements:  Testosterone formulations are schedule 3 controlled substances that requires regular appointments to monitor progress, adjust therapy if needed, and evaluate for complications.   In complex situations, its best to have testosterone replacement therapy managed by an endocrinology or urology specialist.  [x]  Lab Testing requirements: [x]  Testosterone, LH, and FSH: Morning tests (highest levels). Recheck if low to confirm. Monitor levels after starting therapy (3-6 months, then annually).  LH and FSH help determine why your testosterone is low and if other testing is needed.  This is recommended even if you don't want testosterone replacement. total and free testosterone for diagnosis, with additional tests like SHBG (sex hormone-binding globulin) should be done completed to confirm diagnosis. [x] Prolactin Levels(if LH low normal or low): An elevated prolactin level can suppress testosterone production. This test helps identify potential pituitary issues. If elevated, further evaluation (MRI) and more frequent monitoring may be needed.  This test is recommended even if you don't want testosterone replacement. [x]  Serum estradiol should be measured in testosterone  deficient patients who present with breast symptoms or gynecomastia prior to the commencement of testosterone therapy [x]  Liver Function Tests: Abnormal liver function can affect testosterone levels. These tests assess liver health and should be done at least annually regardless of whether you are on testosterone replacement therapy. [x]  Bone Mineral Density (BMD): Testosterone deficiency increases osteoporosis risk. BMD scans measure bone density.  This is only needed if there risk of bone loss such as heavy corticosteroid use, vitamin D deficiency,  frequent fractures [x]  Vitamin D Levels: Vitamin D deficiency can coexist with low testosterone and should be checked annually while on testosterone replacement therapy or if chronically fatigued.  [x]  Glucose and Insulin Levels: Low testosterone is associated with increased insulin resistance risk. These tests assess blood sugar control and are most appropriate if you are overweight. [x]  PSA (Prostate-Specific Antigen): A PSA test screens for prostate cancer before starting therapy, as testosterone can stimulate prostate cancer growth.  This is especially important in patients over 40 on testosterone replacement therapy (annual and prior to start) [x]  CBC should be checked for rising hemoglobin every 6-12 months. [x} Monitoring for signs & symptoms of sleep apnea is required     Erectile Dysfunction   Working with psychiatry, associated with service connected PTSD.   Other Fatigue   Not gone, not severe now   Portal Hypertension (Hcc)   11/23/22 esophagoduodenoscopy with gastrointestinal  showed no varices, portal gastropathy only.   Restless Legs   Not so bad now, now taking requip anymore... could be trazodone.  Prior history  Worsening RLS due to ropinirole (Requip) side effects or inadequate dosing. Secondary RLS related to chronic liver disease, electrolyte imbalances, or folate deficiency. Medication-induced exacerbation of RLS (e.g.,  furosemide-induced electrolyte imbalances). Iron deficiency     Folate Deficiency   Lab Results  Component Value Date   FOLATE 1.6 (L) 08/21/2022  Encouraged Supplements Was associated with alcohol use disorder uncontrolled    Alcohol Use Disorder   Alcohol Use Disorder in Early Remission (ICD-10: F10.21) - Updated: 05/30/23 Maintaining sobriety with significant improvement in liver function and overall health. Continue current management with acamprosate and supportive care. Has a person that supports recovery. Also using the "I Am Sober" app reports August 07 2021  as sobriety/recovery date. They have not been attending [facility] meetings due to work schedule conflicts but expressed interest in online meetings.no near relapses  Prior history: Not a drop since 08/07/21 History severe with alcoholic hepatitis and acute liver failure that seems to be currently resolving.    Drug-Induced Hypokalemia (Resolved)   Lab Results  Component Value Date/Time   K 4.0 04/13/2023 08:37 AM   K 4.3 12/14/2022 12:15 PM   K 4.1 11/02/2022 09:03 AM   K 3.6 08/24/2022 04:17 AM   K 3.4 (L) 08/23/2022 03:26 AM   K 3.1 (L) 08/22/2022 04:08 AM   K 3.1 (L) 08/21/2022 02:32 PM   Due to requiring lasix for ascites   Paronychia of Finger of Right Hand (Resolved)    Med reconciliation: Current Outpatient Medications on File Prior to Visit  Medication Sig   acamprosate (CAMPRAL) 333 MG tablet Take 2 tablets (666 mg total) by mouth 3 (three) times daily with meals.   Boswellia-Glucosamine-Vit D (OSTEO BI-FLEX ONE PER DAY) TABS Take 1 tablet by mouth daily.   celecoxib (CELEBREX) 200 MG capsule Take 1 capsule (200 mg total) by mouth 2 (two) times daily.   folic acid (FOLVITE) 1 MG tablet Take 1 tablet (1 mg total) by mouth daily.   furosemide (LASIX) 20 MG tablet Take 20 mg by mouth daily.   furosemide (LASIX) 40 MG tablet Take 1 tablet (40 mg total) by mouth daily.   gabapentin  (NEURONTIN) 400 MG capsule Take 1 capsule (400 mg total) by mouth 3 (three) times daily. Take by mouth: 1 tablet in the mornings, 1 tablet in mid-day, and 2 tablets at bedtime Note dose adjusted from prior  to help treat  restless legs, discontinue ropinirole   lactulose (CHRONULAC) 10 GM/15ML solution Take 45 mLs by mouth 3 times daily.   potassium chloride (KLOR-CON M) 10 MEQ tablet Take 1 tablet (10 mEq total) by mouth daily.   potassium chloride (MICRO-K) 10 MEQ CR capsule    predniSONE (DELTASONE) 20 MG tablet Take 2 pills for 3 days, 1 pill for 4 days   rOPINIRole (REQUIP) 0.25 MG tablet Take 1 tablet (0.25 mg total) by mouth 3 (three) times daily.   sildenafil (VIAGRA) 100 MG tablet Take 0.5-1 tablets (50-100 mg total) by mouth daily as needed for erectile dysfunction.   spironolactone (ALDACTONE) 100 MG tablet Take 1 tablet (100 mg total) by mouth daily.   thiamine (VITAMIN B-1) 100 MG tablet Take 1 tablet (100 mg total) by mouth daily.   traZODone (DESYREL) 50 MG tablet Take 1 tablet (50 mg total) by mouth at bedtime.   Vitamin D, Ergocalciferol, (DRISDOL) 1.25 MG (50000 UNIT) CAPS capsule Take 1 capsule (50,000 Units total) by mouth every 7 (seven) days.   No current facility-administered medications on file prior to visit.  There are no discontinued medications.   Objective   Physical Exam     05/30/2023    9:57 AM 04/13/2023    8:00 AM 12/14/2022   11:38 AM  Vitals with BMI  Height 5\' 7"  5\' 7"  5\' 7"   Weight 151 lbs 13 oz 154 lbs 6 oz 157 lbs 10 oz  BMI 23.77 24.18 24.68  Systolic 114 124 578  Diastolic 64 74 70  Pulse 80 77 82   Wt Readings from Last 10 Encounters:  05/30/23 151 lb 12.8 oz (68.9 kg)  04/13/23 154 lb 6.4 oz (70 kg)  12/14/22 157 lb 9.6 oz (71.5 kg)  11/24/22 162 lb (73.5 kg)  11/23/22 165 lb (74.8 kg)  11/02/22 163 lb 12.8 oz (74.3 kg)  09/06/22 159 lb (72.1 kg)  08/30/22 175 lb 12.8 oz (79.7 kg)  08/21/22 186 lb 3.2 oz (84.5 kg)  08/19/22 186 lb 3.2  oz (84.5 kg)   Vital signs reviewed.  Nursing notes reviewed. Weight trend reviewed. Abnormalities and Problem-Specific physical exam findings:  sclera have no icterus anymore  General Appearance:  No acute distress appreciable.   Well-groomed, healthy-appearing male.  Well proportioned with no abnormal fat distribution.  Good muscle tone. Pulmonary:  Normal work of breathing at rest, no respiratory distress apparent. SpO2: 99 %  Musculoskeletal: All extremities are intact.  Neurological:  Awake, alert, oriented, and engaged.  No obvious focal neurological deficits or cognitive impairments.  Sensorium seems unclouded.   Speech is clear and coherent with logical content. Psychiatric:  Appropriate mood, pleasant and cooperative demeanor, thoughtful and engaged during the exam  Results   LABS Hemoglobin: 13 (03/2023) Red blood cell size: 117 (03/2023) ANA: positive Potassium: normal Vitamin D: 15 (03/2023) Calcium: improved Folate: low        No results found for any visits on 05/30/23.  Office Visit on 04/13/2023  Component Date Value   Cholesterol 04/13/2023 184    Triglycerides 04/13/2023 73.0    HDL 04/13/2023 73.20    VLDL 04/13/2023 14.6    LDL Cholesterol 04/13/2023 96    Total CHOL/HDL Ratio 04/13/2023 3    NonHDL 04/13/2023 110.79    Sodium 04/13/2023 137    Potassium 04/13/2023 4.0    Chloride 04/13/2023 102    CO2 04/13/2023 29    Glucose, Bld 04/13/2023 99    BUN 04/13/2023 11  Creatinine, Ser 04/13/2023 0.65    Total Bilirubin 04/13/2023 0.6    Alkaline Phosphatase 04/13/2023 136 (H)    AST 04/13/2023 23    ALT 04/13/2023 18    Total Protein 04/13/2023 7.0    Albumin 04/13/2023 4.2    GFR 04/13/2023 118.37    Calcium 04/13/2023 9.3    WBC 04/13/2023 5.3    RBC 04/13/2023 4.18 (L)    Hemoglobin 04/13/2023 12.7 (L)    HCT 04/13/2023 38.8 (L)    MCV 04/13/2023 92.6    MCHC 04/13/2023 32.9    RDW 04/13/2023 13.6    Platelets 04/13/2023 181.0     Neutrophils Relative % 04/13/2023 61.8    Lymphocytes Relative 04/13/2023 30.3    Monocytes Relative 04/13/2023 7.0    Eosinophils Relative 04/13/2023 0.5    Basophils Relative 04/13/2023 0.4    Neutro Abs 04/13/2023 3.3    Lymphs Abs 04/13/2023 1.6    Monocytes Absolute 04/13/2023 0.4    Eosinophils Absolute 04/13/2023 0.0    Basophils Absolute 04/13/2023 0.0    TSH 04/13/2023 1.330    Cholesterol 04/13/2023 185    HDL 04/13/2023 74    Triglycerides 04/13/2023 72    LDL Cholesterol (Calc) 04/13/2023 95    Total CHOL/HDL Ratio 04/13/2023 2.5    Non-HDL Cholesterol (Cal* 04/13/2023 111    VITD 04/13/2023 15.41 (L)    Anti Nuclear Antibody (A* 04/13/2023 Positive (A)    dsDNA Ab 04/13/2023 11 (H)    ENA RNP Ab 04/13/2023 0.2    ENA SM Ab Ser-aCnc 04/13/2023 <0.2    Scleroderma (Scl-70) (EN* 04/13/2023 <0.2    ENA SSA (RO) Ab 04/13/2023 <0.2    ENA SSB (LA) Ab 04/13/2023 <0.2    Chromatin Ab SerPl-aCnc 04/13/2023 <0.2    Anti JO-1 04/13/2023 <0.2    [facility] Ab Screen 04/13/2023 <0.2    See below: 04/13/2023 Comment    Sed Rate 04/13/2023 19 (H)    CRP 04/13/2023 <1.0    Iron 04/13/2023 103    Transferrin 04/13/2023 222.0    Saturation Ratios 04/13/2023 33.1    Ferritin 04/13/2023 52.4    TIBC 04/13/2023 310.8    Testosterone 04/13/2023 328.34   Office Visit on 12/14/2022  Component Date Value   WBC 12/14/2022 3.6 (L)    RBC 12/14/2022 4.41    Hemoglobin 12/14/2022 13.3    HCT 12/14/2022 39.7    MCV 12/14/2022 90.1    MCHC 12/14/2022 33.4    RDW 12/14/2022 13.5    Platelets 12/14/2022 170.0    Neutrophils Relative % 12/14/2022 52.3    Lymphocytes Relative 12/14/2022 33.7    Monocytes Relative 12/14/2022 11.3    Eosinophils Relative 12/14/2022 2.2    Basophils Relative 12/14/2022 0.5    Neutro Abs 12/14/2022 1.9    Lymphs Abs 12/14/2022 1.2    Monocytes Absolute 12/14/2022 0.4    Eosinophils Absolute 12/14/2022 0.1    Basophils Absolute 12/14/2022 0.0    Sodium  12/14/2022 137    Potassium 12/14/2022 4.3    Chloride 12/14/2022 101    CO2 12/14/2022 29    Glucose, Bld 12/14/2022 93    BUN 12/14/2022 13    Creatinine, Ser 12/14/2022 0.61    Total Bilirubin 12/14/2022 0.4    Alkaline Phosphatase 12/14/2022 120 (H)    AST 12/14/2022 32    ALT 12/14/2022 22    Total Protein 12/14/2022 7.7    Albumin 12/14/2022 4.0    GFR 12/14/2022 120.94    Calcium  12/14/2022 9.4    AFP-Tumor Marker 12/14/2022 2.5    Hep B S Ab 12/14/2022 REACTIVE (A)    Hepatitis A AB,Total 12/14/2022 REACTIVE (A)    Sed Rate 12/14/2022 40 (H)    CRP 12/14/2022 <1.0   Admission on 11/23/2022, Discharged on 11/23/2022  Component Date Value   SURGICAL PATHOLOGY 11/23/2022                     Value:SURGICAL PATHOLOGY CASE: [id number] PATIENT: Exav[facility] Hutmacher Surgical Pathology Report     Clinical History: 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 STOMACH, BIOPSY:      Gastric antral mucosa with reactive gastropathy.      Gastric oxyntic mucosa with no significant diagnostic alteration.      No H. pylori identified on HE.      Negative for intestinal metaplasia or dysplasia.   GROSS DESCRIPTION:  Received in formalin are tan, soft tissue fragments that are submitted in toto. Number: 5 size: 0.2-0.4 cm blocks: 1 (GRP 11/15/2022)   Final Diagnosis performed by Lance Coon, MD.   Electronically signed 11/24/2022 Technical component performed at [facility], [address] [place], [place] [address].  Professional component performed at [employer], [address] [place], [place] [address].  Immunohistochemistry Technical component (if applicable) was performed                          at [facility]. [address], [address], [place], [place] [address].   IMMUNOHISTOCHEMISTRY DISCLAIMER (if applicable): Some of these immunohistochemical stains may  have been developed and the performance characteristics determine by [facility]. Some may not have been cleared or approved by the U.S. Food and Drug Administration. The FDA has determined that such clearance or approval is not necessary. This test is used for clinical purposes. It should not be regarded as investigational or for research. This laboratory is certified under the Clinical Laboratory Improvement Amendments of 1988 (CLIA-88) as qualified to perform high complexity clinical laboratory testing.  The controls stained appropriately.   Office Visit on 11/02/2022  Component Date Value   Rheumatoid fact SerPl-aC* 11/02/2022 <10    Cyclic Citrullin Peptide* 11/02/2022 <16    Testosterone 11/02/2022 362.20    VITD 11/02/2022 12.47 (L)    Sed Rate 11/02/2022 29 (H)    CRP 11/02/2022 <1.0    Anti Nuclear Antibody (A* 11/02/2022 POSITIVE (A)    Total CK 11/02/2022 119    Sodium 11/02/2022 137    Potassium 11/02/2022 4.1    Chloride 11/02/2022 101    CO2 11/02/2022 28    Glucose, Bld 11/02/2022 97    BUN 11/02/2022 10    Creatinine, Ser 11/02/2022 0.61    Total Bilirubin 11/02/2022 0.9    Alkaline Phosphatase 11/02/2022 105    AST 11/02/2022 37    ALT 11/02/2022 20    Total Protein 11/02/2022 6.8    Albumin 11/02/2022 3.6    GFR 11/02/2022 121.04    Calcium 11/02/2022 9.3    Testosterone 11/02/2022 649    Testosterone, Free 11/02/2022 2.1 (L)    Sex Hormone Binding 11/02/2022 139.0 (H)    WBC 11/02/2022 5.2    RBC 11/02/2022 3.91 (L)    Hemoglobin 11/02/2022 12.3 (L)    HCT 11/02/2022 37.4 (L)    MCV 11/02/2022 95.5    MCHC 11/02/2022  33.0    RDW 11/02/2022 15.5    Platelets 11/02/2022 221.0    Neutrophils Relative % 11/02/2022 46.5    Lymphocytes Relative 11/02/2022 36.5    Monocytes Relative 11/02/2022 14.3 (H)    Eosinophils Relative 11/02/2022 1.9    Basophils Relative 11/02/2022 0.8    Neutro Abs 11/02/2022 2.4    Lymphs Abs 11/02/2022 1.9     Monocytes Absolute 11/02/2022 0.7    Eosinophils Absolute 11/02/2022 0.1    Basophils Absolute 11/02/2022 0.0    ANA Titer 1 11/02/2022 1:40 (H)    ANA Pattern 1 11/02/2022 Mitotic, Spindle Fibers (A)    ANA TITER 11/02/2022 1:40 (H)    ANA PATTERN 11/02/2022 Cytoplasmic (A)    ANA TITER 11/02/2022 1:40 (H)    ANA PATTERN 11/02/2022 Nuclear, Fine Speckled (A)   Scanned Document on 09/07/2022  Component Date Value   EGFR 09/02/2022 123.0    INR 09/02/2022 1.10   Hospital Outpatient Visit on 09/03/2022  Component Date Value   Fluid Type-FCT 09/03/2022 PERITONEAL    Color, Fluid 09/03/2022 YELLOW    Appearance, Fluid 09/03/2022 HAZY (A)    Total Nucleated Cell Cou* 09/03/2022 210    Neutrophil Count, Fluid 09/03/2022 7    Lymphs, Fluid 09/03/2022 32    Monocyte-Macrophage-Sero* 09/03/2022 61    Eos, Fluid 09/03/2022 0    Other Cells, Fluid 09/03/2022 OTHER CELLS IDENTIFIED AS MESOTHELIAL CELLS    Albumin, Fluid 09/03/2022 <1.5    Fluid Type-FALB 09/03/2022 PERITONEAL    Path Review 09/03/2022 NEGATIVE FOR MALIGNANCY. BENIGN MESOTHELIAL CELLS AND CHRONIC INFLAMMATORY CELLS   Admission on 08/21/2022, Discharged on 08/25/2022  Component Date Value   Sodium 08/21/2022 127 (L)    Potassium 08/21/2022 3.1 (L)    Chloride 08/21/2022 87 (L)    CO2 08/21/2022 30    Glucose, Bld 08/21/2022 100 (H)    BUN 08/21/2022 9    Creatinine, Ser 08/21/2022 0.53 (L)    Calcium 08/21/2022 7.9 (L)    Total Protein 08/21/2022 6.7    Albumin 08/21/2022 2.5 (L)    AST 08/21/2022 257 (H)    ALT 08/21/2022 73 (H)    Alkaline Phosphatase 08/21/2022 232 (H)    Total Bilirubin 08/21/2022 7.5 (H)    GFR, Estimated 08/21/2022 >60    Anion gap 08/21/2022 10    Alcohol, Ethyl (B) 08/21/2022 <10    WBC 08/21/2022 13.3 (H)    RBC 08/21/2022 3.00 (L)    Hemoglobin 08/21/2022 12.0 (L)    HCT 08/21/2022 32.5 (L)    MCV 08/21/2022 108.3 (H)    MCH 08/21/2022 40.0 (H)    MCHC 08/21/2022 36.9 (H)    RDW  08/21/2022 15.3    Platelets 08/21/2022 123 (L)    nRBC 08/21/2022 0.0    Opiates 08/21/2022 NONE DETECTED    Cocaine 08/21/2022 NONE DETECTED    Benzodiazepines 08/21/2022 NONE DETECTED    Amphetamines 08/21/2022 NONE DETECTED    Tetrahydrocannabinol 08/21/2022 NONE DETECTED    Barbiturates 08/21/2022 NONE DETECTED    Lipase 08/21/2022 39    Prothrombin Time 08/21/2022 16.1 (H)    INR 08/21/2022 1.3 (H)    Ammonia 08/21/2022 28    Sodium, Ur 08/21/2022 <10    Chloride Urine 08/21/2022 19    HIV Screen 4th Generatio* 08/21/2022 Non Reactive    Osmolality 08/21/2022 283    Vitamin B-12 08/21/2022 452    Folate 08/21/2022 1.6 (L)    Osmolality, Ur 08/21/2022 776    WBC 08/22/2022  9.4    RBC 08/22/2022 2.95 (L)    Hemoglobin 08/22/2022 11.6 (L)    HCT 08/22/2022 32.9 (L)    MCV 08/22/2022 111.5 (H)    MCH 08/22/2022 39.3 (H)    MCHC 08/22/2022 35.3    RDW 08/22/2022 15.9 (H)    Platelets 08/22/2022 105 (L)    nRBC 08/22/2022 0.2    Sodium 08/22/2022 126 (L)    Potassium 08/22/2022 3.1 (L)    Chloride 08/22/2022 89 (L)    CO2 08/22/2022 31    Glucose, Bld 08/22/2022 104 (H)    BUN 08/22/2022 12    Creatinine, Ser 08/22/2022 0.61    Calcium 08/22/2022 7.7 (L)    Total Protein 08/22/2022 6.4 (L)    Albumin 08/22/2022 2.5 (L)    AST 08/22/2022 186 (H)    ALT 08/22/2022 60 (H)    Alkaline Phosphatase 08/22/2022 208 (H)    Total Bilirubin 08/22/2022 6.3 (H)    GFR, Estimated 08/22/2022 >60    Anion gap 08/22/2022 6    Hepatitis B Surface Ag 08/23/2022 NON REACTIVE    HCV Ab 08/23/2022 NON REACTIVE    Hep A IgM 08/23/2022 NON REACTIVE    Hep B C IgM 08/23/2022 NON REACTIVE    Ammonia 08/23/2022 53 (H)    Sodium 08/23/2022 130 (L)    Potassium 08/23/2022 3.4 (L)    Chloride 08/23/2022 91 (L)    CO2 08/23/2022 32    Glucose, Bld 08/23/2022 111 (H)    BUN 08/23/2022 10    Creatinine, Ser 08/23/2022 0.59 (L)    Calcium 08/23/2022 8.0 (L)    Total Protein 08/23/2022 6.1  (L)    Albumin 08/23/2022 2.4 (L)    AST 08/23/2022 155 (H)    ALT 08/23/2022 54 (H)    Alkaline Phosphatase 08/23/2022 189 (H)    Total Bilirubin 08/23/2022 4.3 (H)    GFR, Estimated 08/23/2022 >60    Anion gap 08/23/2022 7    Prothrombin Time 08/23/2022 16.0 (H)    INR 08/23/2022 1.3 (H)    WBC 08/23/2022 8.4    RBC 08/23/2022 2.78 (L)    Hemoglobin 08/23/2022 11.1 (L)    HCT 08/23/2022 31.9 (L)    MCV 08/23/2022 114.7 (H)    MCH 08/23/2022 39.9 (H)    MCHC 08/23/2022 34.8    RDW 08/23/2022 16.0 (H)    Platelets 08/23/2022 113 (L)    nRBC 08/23/2022 0.0    CYTOLOGY - NON GYN 08/23/2022                     Value:CYTOLOGY - NON PAP CASE: [id number] PATIENT: Vanderbilt Garno Non-Gynecological Cytology Report     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 ASCITES, PARACENTESIS:   FINAL MICROSCOPIC DIAGNOSIS: - No malignant cells identified - Numerous benign mesothelial cells with scattered lymphocytes within a serous background  SPECIMEN ADEQUACY: Satisfactory for evaluation  GROSS: Received is/are 1000 ccs of yellow fluid. (CM:cm) Prepared: Smears:  0 Concentration Method (Thin Prep):  1 Cell Block:  1 Additional Studies:  n/a     Final Diagnosis performed by Jerene Bears, MD.   Electronically signed 08/25/2022 Technical component performed at [facility], [address], [place], [place] [address].  Professional component performed at [employer], [address] [place], [place] [address].  Immunohistochemistry Technical component (if applicable) was performed at [facility]. [address], [place], [place] [address].   IMMUNOHISTOCHEMISTRY DISCLAIMER (if applicable): Some of these immunohistochemical stains may have been developed and the performance characteristics determine by [facility]. Some may  not have been cleared or approved by the U.S. Food and Drug Administration. The FDA has determined that such clearance or approval is not necessary. This test is used for clinical purposes. It should not be regarded as investigational or for research. This laboratory is certified under the Clinical Laboratory Improvement Amendments of 1988 (CLIA-88) as qualified to perform high complexity clinical laboratory testing.  The controls stained appropriately.    Fluid Type-FCT 08/23/2022 CYTO PERI    Color, Fluid 08/23/2022 YELLOW (A)    Appearance, Fluid 08/23/2022 CLEAR    Total Nucleated Cell Cou* 08/23/2022 173    Neutrophil Count, Fluid 08/23/2022 7    Lymphs, Fluid 08/23/2022 7    Monocyte-Macrophage-Sero* 08/23/2022 86    Eos, Fluid 08/23/2022 0    Albumin, Fluid 08/23/2022 <1.5    Fluid Type-FALB 08/23/2022 CYTO PERI    WBC 08/24/2022 8.7    RBC 08/24/2022 3.09 (L)    Hemoglobin 08/24/2022 12.7 (L)    HCT 08/24/2022 36.3 (L)    MCV 08/24/2022 117.5 (H)    MCH 08/24/2022 41.1 (H)    MCHC 08/24/2022 35.0    RDW 08/24/2022 16.9 (H)    Platelets 08/24/2022 121 (L)    nRBC 08/24/2022 0.2    Sodium 08/24/2022 131 (L)    Potassium 08/24/2022 3.6    Chloride 08/24/2022 94 (L)    CO2 08/24/2022 31    Glucose, Bld 08/24/2022 111 (H)    BUN 08/24/2022 9    Creatinine, Ser 08/24/2022 0.67    Calcium 08/24/2022 8.0 (L)    Total Protein 08/24/2022 6.5    Albumin 08/24/2022 2.6 (L)    AST 08/24/2022 139 (H)    ALT 08/24/2022 54 (H)    Alkaline Phosphatase 08/24/2022 187 (H)    Total Bilirubin 08/24/2022 4.5 (H)    GFR, Estimated 08/24/2022 >60    Anion gap 08/24/2022 6    Prothrombin Time 08/24/2022 15.6 (H)    INR 08/24/2022 1.3 (H)   Office Visit on 08/19/2022  Component Date Value   AFP-Tumor Marker 08/19/2022 4.2    Cholesterol 08/19/2022 128    Triglycerides 08/19/2022 96.0    HDL 08/19/2022 27.70 (L)    VLDL 08/19/2022 19.2    LDL Cholesterol 08/19/2022 81    Total  CHOL/HDL Ratio 08/19/2022 5    NonHDL 08/19/2022 99.98    TSH 08/19/2022 4.95    Hgb A1c MFr Bld 08/19/2022 4.3 (L)    Sodium 08/19/2022 134 (L)    Potassium 08/19/2022 3.4 (L)    Chloride 08/19/2022 87 (L)    CO2 08/19/2022 33 (H)    Glucose, Bld 08/19/2022 95    BUN 08/19/2022 6    Creatinine, Ser 08/19/2022 0.71    Total Bilirubin 08/19/2022 3.6 (H)    Alkaline Phosphatase 08/19/2022 295 (H)    AST 08/19/2022 232 (H)    ALT 08/19/2022 56 (H)    Total Protein 08/19/2022 6.5    Albumin 08/19/2022 3.0 (L)    GFR 08/19/2022 115.78    Calcium 08/19/2022 8.2 (L)    WBC 08/19/2022 9.3    RBC 08/19/2022 3.14 (L)    Platelets 08/19/2022 150.0    Hemoglobin 08/19/2022 12.7 (L)    HCT 08/19/2022 36.9 (L)    MCV 08/19/2022 117.5 Repeated  and verified X2. (H)    MCHC 08/19/2022 34.5    RDW 08/19/2022 15.7 (H)   Admission on 07/31/2022, Discharged on 07/31/2022  Component Date Value   WBC 07/31/2022 8.3    RBC 07/31/2022 3.43 (L)    Hemoglobin 07/31/2022 13.3    HCT 07/31/2022 36.9 (L)    MCV 07/31/2022 107.6 (H)    MCH 07/31/2022 38.8 (H)    MCHC 07/31/2022 36.0    RDW 07/31/2022 16.4 (H)    Platelets 07/31/2022 128 (L)    nRBC 07/31/2022 0.0    Neutrophils Relative % 07/31/2022 75    Neutro Abs 07/31/2022 6.3    Lymphocytes Relative 07/31/2022 13    Lymphs Abs 07/31/2022 1.1    Monocytes Relative 07/31/2022 10    Monocytes Absolute 07/31/2022 0.8    Eosinophils Relative 07/31/2022 0    Eosinophils Absolute 07/31/2022 0.0    Basophils Relative 07/31/2022 1    Basophils Absolute 07/31/2022 0.1    Immature Granulocytes 07/31/2022 1    Abs Immature Granulocytes 07/31/2022 0.05    Color, Urine 07/31/2022 AMBER (A)    APPearance 07/31/2022 CLEAR    Specific Gravity, Urine 07/31/2022 1.020    pH 07/31/2022 6.0    Glucose, UA 07/31/2022 NEGATIVE    Hgb urine dipstick 07/31/2022 NEGATIVE    Bilirubin Urine 07/31/2022 MODERATE (A)    Ketones, ur 07/31/2022 NEGATIVE    Protein,  ur 07/31/2022 30 (A)    Nitrite 07/31/2022 NEGATIVE    Leukocytes,Ua 07/31/2022 NEGATIVE    RBC / HPF 07/31/2022 0-5    WBC, UA 07/31/2022 0-5    Bacteria, UA 07/31/2022 MANY (A)    Squamous Epithelial / HPF 07/31/2022 0-5    Sodium 07/31/2022 132 (L)    Potassium 07/31/2022 3.4 (L)    Chloride 07/31/2022 95 (L)    CO2 07/31/2022 28    Glucose, Bld 07/31/2022 137 (H)    BUN 07/31/2022 <5 (L)    Creatinine, Ser 07/31/2022 0.59 (L)    Calcium 07/31/2022 7.6 (L)    Total Protein 07/31/2022 6.9    Albumin 07/31/2022 2.8 (L)    AST 07/31/2022 175 (H)    ALT 07/31/2022 37    Alkaline Phosphatase 07/31/2022 245 (H)    Total Bilirubin 07/31/2022 2.6 (H)    GFR, Estimated 07/31/2022 >60    Anion gap 07/31/2022 9    Lipase 07/31/2022 23    No image results found.   US Abdomen Complete  Result Date: 04/02/2023 CLINICAL DATA:  Cirrhosis EXAM: ABDOMEN ULTRASOUND COMPLETE COMPARISON:  MR abdomen 08/22/2022 FINDINGS: Gallbladder: No gallstones or wall thickening visualized. No sonographic Murphy sign noted by sonographer. Common bile duct: Diameter: 6.8 mm, prominent Liver: Coarsened echogenicity and nodular contour. No focal lesion. Portal vein is patent on color Doppler imaging with normal direction of blood flow towards the liver. IVC: No abnormality visualized. Pancreas: Visualized portion unremarkable. Spleen: Size and appearance within normal limits. Right Kidney: Length: 10.4 cm. Echogenicity within normal limits. No mass or hydronephrosis visualized. Left Kidney: Length: 11.6 cm. Echogenicity within normal limits. No mass or hydronephrosis visualized. Abdominal aorta: No aneurysm visualized. Other findings: None. IMPRESSION: 1. Cirrhotic morphology of the liver. No focal lesion. 2. Common bile duct is prominent measuring 6.8 mm. Recommend correlation with LFTs. Electronically Signed   By: Annia Belt M.D.   On: 04/02/2023 20:02    US Abdomen Complete       Additional Info: This encounter  employed real-time, collaborative documentation. The patient actively  reviewed and updated their medical record on a shared screen, ensuring transparency and facilitating joint problem-solving for the problem list, overview, and plan. This approach promotes accurate, informed care. The treatment plan was discussed and reviewed in detail, including medication safety, potential side effects, and all patient questions. We confirmed understanding and comfort with the plan. Follow-up instructions were established, including contacting the office for any concerns, returning if symptoms worsen, persist, or new symptoms develop, and precautions for potential emergency department visits.

## 2023-05-30 NOTE — Addendum Note (Signed)
Addended by: Lula Olszewski on: 05/30/2023 06:10 PM   Modules accepted: Level of Service

## 2023-05-30 NOTE — Assessment & Plan Note (Addendum)
Since symptoms resolved- will defer further rheum workup at this time

## 2023-06-29 ENCOUNTER — Other Ambulatory Visit: Payer: Self-pay | Admitting: Internal Medicine

## 2023-06-29 DIAGNOSIS — R21 Rash and other nonspecific skin eruption: Secondary | ICD-10-CM

## 2023-06-29 DIAGNOSIS — R768 Other specified abnormal immunological findings in serum: Secondary | ICD-10-CM

## 2023-06-29 DIAGNOSIS — M779 Enthesopathy, unspecified: Secondary | ICD-10-CM

## 2023-06-29 DIAGNOSIS — M064 Inflammatory polyarthropathy: Secondary | ICD-10-CM

## 2023-06-29 DIAGNOSIS — G2581 Restless legs syndrome: Secondary | ICD-10-CM

## 2023-06-29 DIAGNOSIS — K7030 Alcoholic cirrhosis of liver without ascites: Secondary | ICD-10-CM

## 2023-06-29 DIAGNOSIS — D538 Other specified nutritional anemias: Secondary | ICD-10-CM

## 2023-06-29 DIAGNOSIS — R61 Generalized hyperhidrosis: Secondary | ICD-10-CM

## 2023-06-29 DIAGNOSIS — R7989 Other specified abnormal findings of blood chemistry: Secondary | ICD-10-CM

## 2023-06-29 DIAGNOSIS — E559 Vitamin D deficiency, unspecified: Secondary | ICD-10-CM

## 2023-06-29 DIAGNOSIS — F1090 Alcohol use, unspecified, uncomplicated: Secondary | ICD-10-CM

## 2023-06-29 DIAGNOSIS — G4700 Insomnia, unspecified: Secondary | ICD-10-CM

## 2023-06-29 DIAGNOSIS — T50905A Adverse effect of unspecified drugs, medicaments and biological substances, initial encounter: Secondary | ICD-10-CM

## 2023-08-29 ENCOUNTER — Ambulatory Visit: Payer: BC Managed Care – PPO | Admitting: Internal Medicine

## 2023-09-12 ENCOUNTER — Ambulatory Visit: Admitting: Internal Medicine

## 2023-09-15 DIAGNOSIS — K7030 Alcoholic cirrhosis of liver without ascites: Secondary | ICD-10-CM | POA: Diagnosis not present

## 2023-09-15 DIAGNOSIS — K7400 Hepatic fibrosis, unspecified: Secondary | ICD-10-CM | POA: Diagnosis not present

## 2023-09-27 ENCOUNTER — Other Ambulatory Visit: Payer: Self-pay | Admitting: Internal Medicine

## 2023-09-27 DIAGNOSIS — R21 Rash and other nonspecific skin eruption: Secondary | ICD-10-CM

## 2023-09-27 DIAGNOSIS — D538 Other specified nutritional anemias: Secondary | ICD-10-CM

## 2023-09-27 DIAGNOSIS — M779 Enthesopathy, unspecified: Secondary | ICD-10-CM

## 2023-09-27 DIAGNOSIS — E559 Vitamin D deficiency, unspecified: Secondary | ICD-10-CM

## 2023-09-27 DIAGNOSIS — R61 Generalized hyperhidrosis: Secondary | ICD-10-CM

## 2023-09-27 DIAGNOSIS — G2581 Restless legs syndrome: Secondary | ICD-10-CM

## 2023-09-27 DIAGNOSIS — K7030 Alcoholic cirrhosis of liver without ascites: Secondary | ICD-10-CM

## 2023-09-27 DIAGNOSIS — R768 Other specified abnormal immunological findings in serum: Secondary | ICD-10-CM

## 2023-09-27 DIAGNOSIS — M1990 Unspecified osteoarthritis, unspecified site: Secondary | ICD-10-CM

## 2023-09-27 DIAGNOSIS — F1090 Alcohol use, unspecified, uncomplicated: Secondary | ICD-10-CM

## 2023-09-27 DIAGNOSIS — T50905A Adverse effect of unspecified drugs, medicaments and biological substances, initial encounter: Secondary | ICD-10-CM

## 2023-09-27 DIAGNOSIS — G4700 Insomnia, unspecified: Secondary | ICD-10-CM

## 2023-09-27 DIAGNOSIS — R7989 Other specified abnormal findings of blood chemistry: Secondary | ICD-10-CM

## 2023-09-27 NOTE — Telephone Encounter (Signed)
 LOV:05/30/2023 NEXT OV:N/A QUAT:90 TAB LAST FILLED:06/30/2023

## 2023-10-06 ENCOUNTER — Ambulatory Visit: Payer: BC Managed Care – PPO | Admitting: Dermatology

## 2023-10-19 IMAGING — DX DG ANKLE COMPLETE 3+V*L*
3 series · 3 of 3 positions shown · non-contrast
Comparison: None.

CLINICAL DATA: Left ankle pain

EXAM:
LEFT ANKLE COMPLETE - 3+ VIEW

[ankle ap]
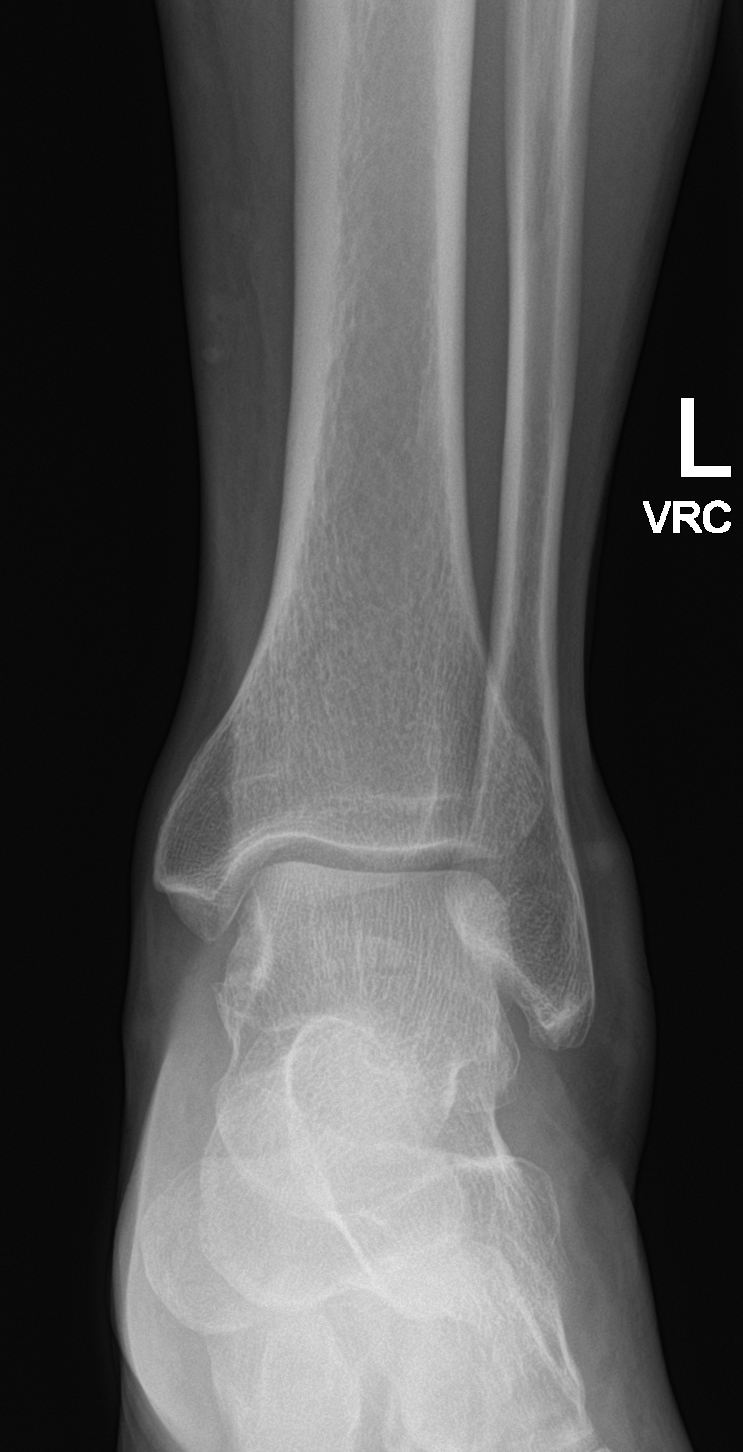

[ankle obl]
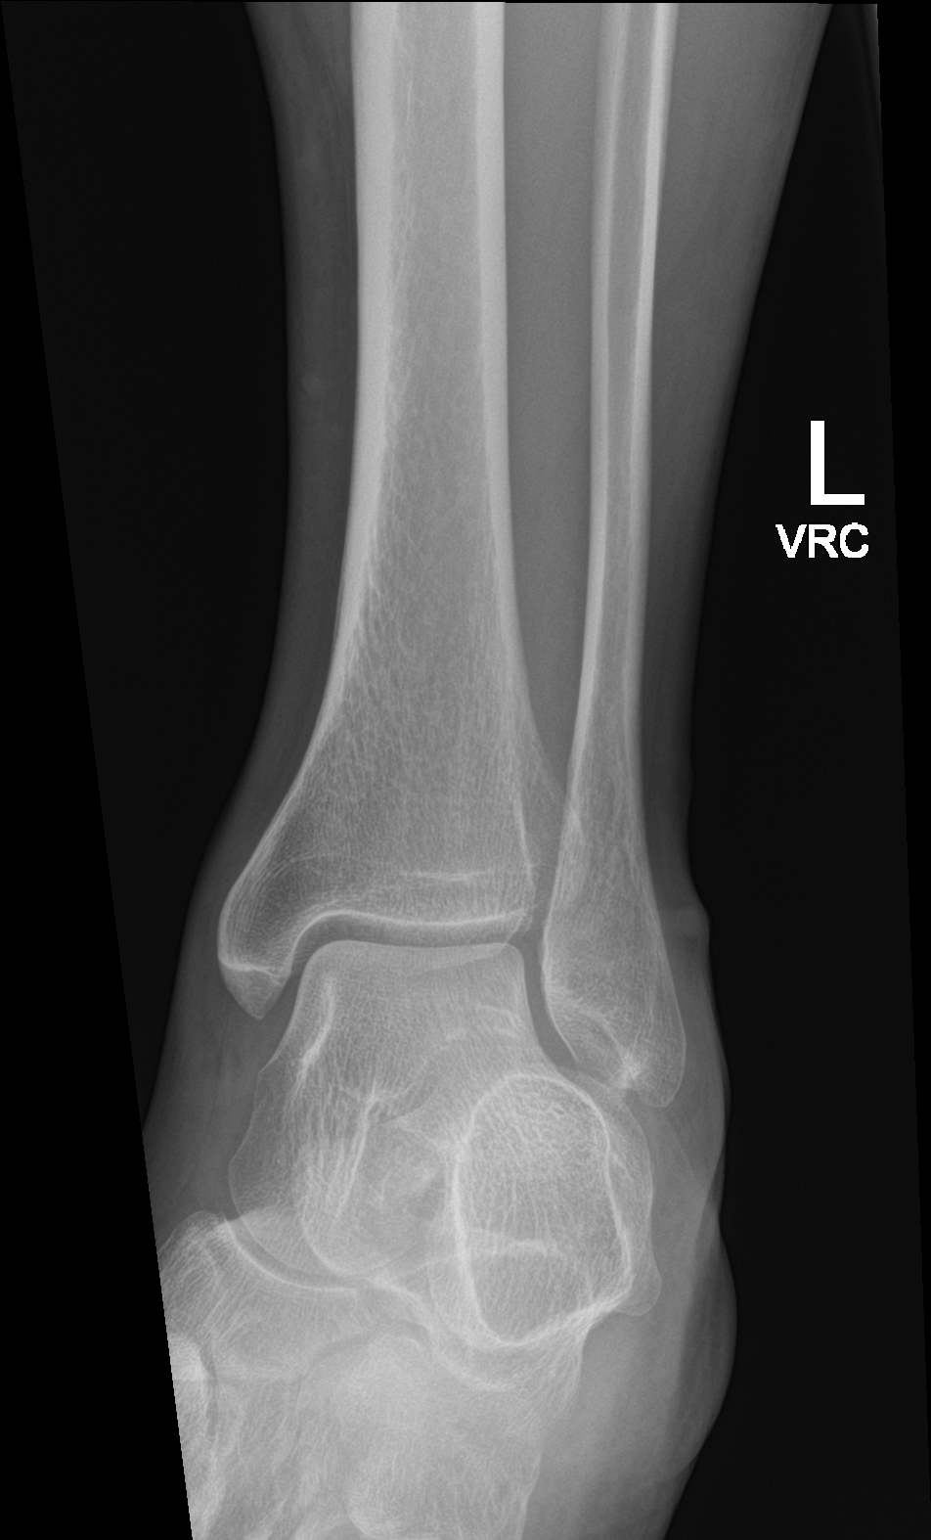

[ankle lat]
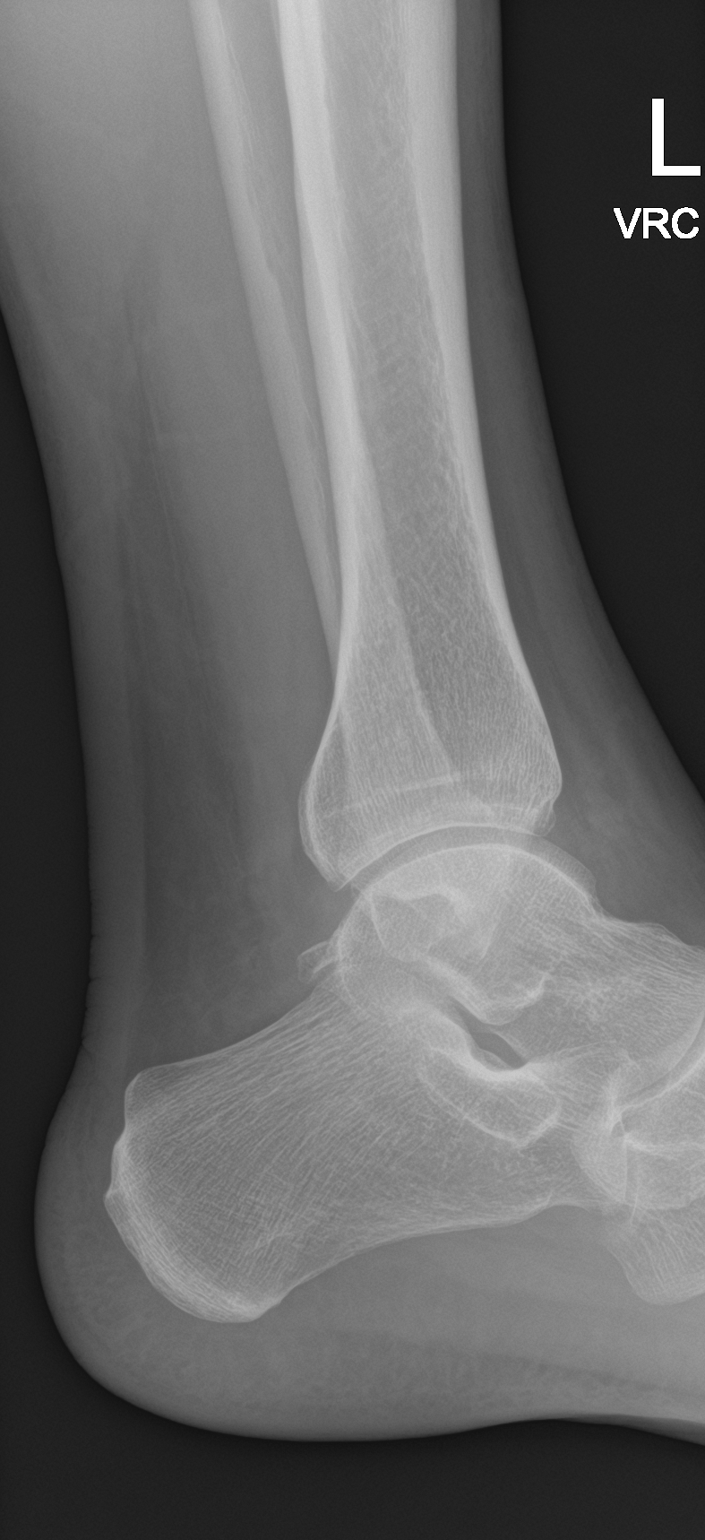

[3 of 3 positions shown; findings below may reference images not displayed]

FINDINGS: There is no evidence of fracture, dislocation, or joint effusion.
There is no evidence of arthropathy or other focal bone abnormality.
Soft tissues are unremarkable.
IMPRESSION: No acute abnormality

## 2023-10-20 ENCOUNTER — Ambulatory Visit: Admitting: Internal Medicine

## 2023-10-20 ENCOUNTER — Encounter: Payer: Self-pay | Admitting: Internal Medicine

## 2023-10-20 VITALS — BP 122/70 | HR 85 | Temp 97.6°F | Resp 18 | Ht 67.0 in | Wt 144.0 lb

## 2023-10-20 DIAGNOSIS — E876 Hypokalemia: Secondary | ICD-10-CM

## 2023-10-20 DIAGNOSIS — F1090 Alcohol use, unspecified, uncomplicated: Secondary | ICD-10-CM

## 2023-10-20 DIAGNOSIS — R21 Rash and other nonspecific skin eruption: Secondary | ICD-10-CM

## 2023-10-20 DIAGNOSIS — R61 Generalized hyperhidrosis: Secondary | ICD-10-CM

## 2023-10-20 DIAGNOSIS — R768 Other specified abnormal immunological findings in serum: Secondary | ICD-10-CM

## 2023-10-20 DIAGNOSIS — K7030 Alcoholic cirrhosis of liver without ascites: Secondary | ICD-10-CM | POA: Diagnosis not present

## 2023-10-20 DIAGNOSIS — R7989 Other specified abnormal findings of blood chemistry: Secondary | ICD-10-CM

## 2023-10-20 DIAGNOSIS — M1990 Unspecified osteoarthritis, unspecified site: Secondary | ICD-10-CM

## 2023-10-20 DIAGNOSIS — T50905A Adverse effect of unspecified drugs, medicaments and biological substances, initial encounter: Secondary | ICD-10-CM

## 2023-10-20 DIAGNOSIS — G4700 Insomnia, unspecified: Secondary | ICD-10-CM

## 2023-10-20 DIAGNOSIS — E559 Vitamin D deficiency, unspecified: Secondary | ICD-10-CM

## 2023-10-20 DIAGNOSIS — D538 Other specified nutritional anemias: Secondary | ICD-10-CM

## 2023-10-20 DIAGNOSIS — N529 Male erectile dysfunction, unspecified: Secondary | ICD-10-CM

## 2023-10-20 DIAGNOSIS — F32A Depression, unspecified: Secondary | ICD-10-CM

## 2023-10-20 DIAGNOSIS — G2581 Restless legs syndrome: Secondary | ICD-10-CM

## 2023-10-20 MED ORDER — TRAZODONE HCL 50 MG PO TABS: 50.0000 mg | ORAL_TABLET | Freq: Every day | ORAL | 0 refills | Status: DC

## 2023-10-20 MED ORDER — ACAMPROSATE CALCIUM 333 MG PO TBEC: 666.0000 mg | DELAYED_RELEASE_TABLET | Freq: Three times a day (TID) | ORAL | Status: DC

## 2023-10-20 MED ORDER — TADALAFIL 20 MG PO TABS: 10.0000 mg | ORAL_TABLET | ORAL | 3 refills | Status: AC | PRN

## 2023-10-20 NOTE — Assessment & Plan Note (Signed)
 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

## 2023-10-20 NOTE — Assessment & Plan Note (Signed)
 Resolved per patient report, removed from problem list today after confirming [facility] labwork

## 2023-10-20 NOTE — Assessment & Plan Note (Signed)
 Lab Results  Component Value Date   VD25OH 15.41 (L) 04/13/2023   VD25OH 12.47 (L) 11/02/2022  Encouraged patient to continue(s) with

## 2023-10-20 NOTE — Assessment & Plan Note (Signed)
 He is maintaining sobriety without cravings, continuing acamprosate  and trazodone . He no longer attends twelve-step programs but uses the '[employer] Sober' app. Gabapentin  is discontinued as cravings have subsided. Continued sobriety is crucial to prevent relapse and potential liver failure. Continue acamprosate , refill trazodone , and encourage the use of the 'I Am Sober' app.

## 2023-10-20 NOTE — Assessment & Plan Note (Signed)
 Managed with sildenafil , he is interested in trying tadalafil  (Cialis ) for longer duration. Low testosterone  is noted but not low enough for TRT. Trazodone  and spironolactone  may contribute to reduced libido. Alternative options for testosterone  supplementation through online services were discussed, though not medically indicated. Discontinue spironolactone , prescribe tadalafil  (Cialis ) through [facility], and encourage B12 supplementation to support nerve health and erections.

## 2023-10-20 NOTE — Assessment & Plan Note (Signed)
 Liver function has significantly improved with sobriety. Previous imaging showed no liver cancer, and the gallbladder is functioning well. Recent labs from [facility] indicate normal liver function and improved anemia. A liver transplant is not needed as the liver is functioning adequately. The risk of relapse leading to liver failure was discussed, emphasizing the importance of maintaining sobriety. No immediate imaging or further liver function tests are needed unless symptoms like hematemesis or melena occur. Continue monitoring liver function with periodic check-ins as advised by [facility].

## 2023-10-20 NOTE — Assessment & Plan Note (Signed)
 Not low enough to warrant treatment(s). Lab Results  Component Value Date   TESTOSTERONE  328.34 04/13/2023

## 2023-10-20 NOTE — Progress Notes (Signed)
 ==============================  [facility]: [phone]   -- Medical Office Visit --  Patient: John Maxwell      Age: 43 y.o.       Sex:  male  Date:   10/20/2023 Today's Healthcare Provider: Anthon Kins, MD  ==============================   Chief Complaint: 3 month follow up (Concerns/ questions: refills on Gabapentin  and Trazodone  if you would like for the pt to remain on these.)  History of Present Illness 43 year old male with alcohol use disorder and cirrhosis who presents for routine follow-up.  He is maintaining sobriety using the [employer] app and is currently taking acamprosate . He has discontinued gabapentin  as he no longer experiences alcohol cravings. Initial irritability was noted after stopping gabapentin , but he has since adjusted. No current cravings for alcohol are reported.  He is taking trazodone  to aid with sleep and wishes to continue it. He experiences mild depression with a PHQ score of 12, which he finds annoying but manageable. He does not wish to pursue psychiatric treatment or medication adjustments for depression at this time.  He has a history of cirrhosis, which has shown improvement. No recent episodes of hematemesis, melena, or hematochezia are reported. His liver function has improved significantly, and he is no longer on the transplant list. Recent blood work from [facility] shows improved anemia and normal liver and kidney function.  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 No current issues with bone or muscle pain are reported.  He is not currently taking several medications that were previously prescribed, including spironolactone , Celebrex , Lasix , gabapentin , lactulose , and Requip , as the symptoms he was prescribed for have resolved.  He reports issues with erectile dysfunction, which he attributes partly to mental factors. He is currently using  Viagra  and is interested in trying Cialis . He acknowledges that trazodone  may lower libido and that spironolactone , which he is no longer taking, could have affected testosterone  levels. He is aware that his testosterone  levels are low but not low enough for TRT.  No use of marijuana or other substances as a substitute for alcohol. He engages in activities such as work, Administrator, sports, and Statistician for enjoyment.   Background Reviewed: Problem List: has Alcohol use disorder; Alcoholic cirrhosis of liver (HCC); Anemia; Folate deficiency; Portal hypertension (HCC); Restless legs; Undifferentiated inflammatory arthritis; Erectile dysfunction; Other fatigue; Insomnia; ANA positive; Low testosterone  in male; Reactive gastropathy; Rash; Night sweats; Vitamin D  deficiency; Hepatic osteodystrophy; and Autoimmune disorder (HCC) on their problem list. Medications:  has a current medication list which includes the following prescription(s): folic acid , sildenafil , tadalafil , thiamine , vitamin d  (ergocalciferol ), acamprosate , and trazodone .  Allergies:  is allergic to cyclobenzaprine and menthol (topical analgesic).  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 Past Surgical History:   has a past surgical history that includes Appendectomy; IR  Paracentesis (09/03/2022); Esophagogastroduodenoscopy (egd) with propofol  (N/A, 11/23/2022); and biopsy (11/23/2022). Social History:   reports that he has quit smoking. His smoking use included cigarettes. He has never  used smokeless tobacco. He reports current alcohol use. He reports that he does not use drugs. Family History:  family history includes Depression in his father; Diabetes in his father; Heart attack in his father; Heart disease in his father; Mental illness in his father. Depression Screen and Health Maintenance:    05/30/2023   10:03 AM 04/13/2023    8:09 AM 11/24/2022   10:22 AM 08/19/2022   11:09 AM  PHQ 2/9 Scores  PHQ - 2 Score 3 4 2 3   PHQ- 9 Score 12 14 10 11     Medication Reconciliation: Current Outpatient Medications on File Prior to Visit  Medication Sig   folic acid  (FOLVITE ) 1 MG tablet Take 1 tablet (1 mg total) by mouth daily.   sildenafil  (VIAGRA ) 100 MG tablet Take 0.5-1 tablets (50-100 mg total) by mouth daily as needed for erectile dysfunction.   thiamine  (VITAMIN B-1) 100 MG tablet Take 1 tablet (100 mg total) by mouth daily.   Vitamin D , Ergocalciferol , (DRISDOL ) 1.25 MG (50000 UNIT) CAPS capsule Take 1 capsule (50,000 Units total) by mouth every 7 (seven) days.   No current facility-administered medications on file prior to visit.   Medications Discontinued During This Encounter  Medication Reason   potassium chloride  (MICRO-K ) 10 MEQ CR capsule Duplicate   spironolactone  (ALDACTONE ) 100 MG tablet Completed Course   predniSONE  (DELTASONE ) 20 MG tablet Completed Course   potassium chloride  (KLOR-CON  M) 10 MEQ tablet Completed Course   furosemide  (LASIX ) 40 MG tablet Completed Course   rOPINIRole  (REQUIP ) 0.25 MG tablet Completed Course   lactulose  (CHRONULAC ) 10 GM/15ML solution Completed Course   celecoxib  (CELEBREX ) 200 MG capsule Completed Course   gabapentin  (NEURONTIN ) 400 MG capsule Completed Course   Boswellia-Glucosamine-Vit D (OSTEO BI-FLEX  ONE PER DAY) TABS Completed Course   furosemide  (LASIX ) 20 MG tablet Completed Course   acamprosate  (CAMPRAL ) 333 MG tablet Reorder   traZODone  (DESYREL ) 50 MG tablet Reorder        Physical Exam:    10/20/2023   11:03 AM 05/30/2023    9:57 AM 04/13/2023    8:00 AM  Vitals with BMI  Height 5\' 7"  5\' 7"  5\' 7"   Weight 144 lbs 151 lbs 13 oz 154 lbs 6 oz  BMI 22.55 23.77 24.18  Systolic 122 114 657  Diastolic 70 64 74  Pulse 85 80 77   Wt Readings from Last 10 Encounters:  10/20/23 144 lb (65.3 kg)  05/30/23 151 lb 12.8 oz (68.9 kg)  04/13/23 154 lb 6.4 oz (70 kg)  12/14/22 157 lb 9.6 oz (71.5 kg)  11/24/22 162 lb (73.5 kg)  11/23/22 165 lb (74.8 kg)  11/02/22 163 lb 12.8 oz (74.3 kg)  09/06/22 159 lb (72.1 kg)  08/30/22 175 lb 12.8 oz (79.7 kg)  08/21/22 186 lb 3.2 oz (84.5 kg)  Vital signs reviewed.  Nursing notes reviewed. Weight trend reviewed. Physical Exam  Physical Exam HEENT: Sclerae anicteric. ABDOMEN: Decreased distension, no ascites.   General Appearance:  No acute distress appreciable.   Well-groomed, healthy-appearing male.  Well proportioned with no abnormal fat distribution.  Good muscle tone. Pulmonary:  Normal work of breathing at rest, no respiratory distress apparent. SpO2: 98 %  Musculoskeletal: All extremities are intact.  Neurological:  Awake, alert, oriented, and engaged.  No obvious focal neurological deficits or cognitive impairments.  Sensorium seems unclouded.   Speech is clear and coherent with logical content. Psychiatric:  Appropriate mood, pleasant and cooperative demeanor, thoughtful and engaged during  the exam    No results found for any visits on 10/20/23. Office Visit on 04/13/2023  Component Date Value   Cholesterol 04/13/2023 184    Triglycerides 04/13/2023 73.0    HDL 04/13/2023 73.20    VLDL 04/13/2023 14.6    LDL Cholesterol 04/13/2023 96    Total CHOL/HDL Ratio 04/13/2023 3    NonHDL 04/13/2023 110.79    Sodium 04/13/2023 137     Potassium 04/13/2023 4.0    Chloride 04/13/2023 102    CO2 04/13/2023 29    Glucose, Bld 04/13/2023 99    BUN 04/13/2023 11    Creatinine, Ser 04/13/2023 0.65    Total Bilirubin 04/13/2023 0.6    Alkaline Phosphatase 04/13/2023 136 (H)    AST 04/13/2023 23    ALT 04/13/2023 18    Total Protein 04/13/2023 7.0    Albumin 04/13/2023 4.2    GFR 04/13/2023 118.37    Calcium  04/13/2023 9.3    WBC 04/13/2023 5.3    RBC 04/13/2023 4.18 (L)    Hemoglobin 04/13/2023 12.7 (L)    HCT 04/13/2023 38.8 (L)    MCV 04/13/2023 92.6    MCHC 04/13/2023 32.9    RDW 04/13/2023 13.6    Platelets 04/13/2023 181.0    Neutrophils Relative % 04/13/2023 61.8    Lymphocytes Relative 04/13/2023 30.3    Monocytes Relative 04/13/2023 7.0    Eosinophils Relative 04/13/2023 0.5    Basophils Relative 04/13/2023 0.4    Neutro Abs 04/13/2023 3.3    Lymphs Abs 04/13/2023 1.6    Monocytes Absolute 04/13/2023 0.4    Eosinophils Absolute 04/13/2023 0.0    Basophils Absolute 04/13/2023 0.0    TSH 04/13/2023 1.330    Cholesterol 04/13/2023 185    HDL 04/13/2023 74    Triglycerides 04/13/2023 72    LDL Cholesterol (Calc) 04/13/2023 95    Total CHOL/HDL Ratio 04/13/2023 2.5    Non-HDL Cholesterol (Cal* 04/13/2023 111    VITD 04/13/2023 15.41 (L)    Anti Nuclear Antibody (A* 04/13/2023 Positive (A)    dsDNA Ab 04/13/2023 11 (H)    ENA RNP Ab 04/13/2023 0.2    ENA SM Ab Ser-aCnc 04/13/2023 <0.2    Scleroderma (Scl-70) (EN* 04/13/2023 <0.2    ENA SSA (RO) Ab 04/13/2023 <0.2    ENA SSB (LA) Ab 04/13/2023 <0.2    Chromatin Ab SerPl-aCnc 04/13/2023 <0.2    Anti JO-1 04/13/2023 <0.2    Centromere Ab Screen 04/13/2023 <0.2    See below: 04/13/2023 Comment    Sed Rate 04/13/2023 19 (H)    CRP 04/13/2023 <1.0    Iron 04/13/2023 103    Transferrin 04/13/2023 222.0    Saturation Ratios 04/13/2023 33.1    Ferritin 04/13/2023 52.4    TIBC 04/13/2023 310.8    Testosterone  04/13/2023 328.34   Office Visit on  12/14/2022  Component Date Value   WBC 12/14/2022 3.6 (L)    RBC 12/14/2022 4.41    Hemoglobin 12/14/2022 13.3    HCT 12/14/2022 39.7    MCV 12/14/2022 90.1    MCHC 12/14/2022 33.4    RDW 12/14/2022 13.5    Platelets 12/14/2022 170.0    Neutrophils Relative % 12/14/2022 52.3    Lymphocytes Relative 12/14/2022 33.7    Monocytes Relative 12/14/2022 11.3    Eosinophils Relative 12/14/2022 2.2    Basophils Relative 12/14/2022 0.5    Neutro Abs 12/14/2022 1.9    Lymphs Abs 12/14/2022 1.2    Monocytes Absolute 12/14/2022 0.4  Eosinophils Absolute 12/14/2022 0.1    Basophils Absolute 12/14/2022 0.0    Sodium 12/14/2022 137    Potassium 12/14/2022 4.3    Chloride 12/14/2022 101    CO2 12/14/2022 29    Glucose, Bld 12/14/2022 93    BUN 12/14/2022 13    Creatinine, Ser 12/14/2022 0.61    Total Bilirubin 12/14/2022 0.4    Alkaline Phosphatase 12/14/2022 120 (H)    AST 12/14/2022 32    ALT 12/14/2022 22    Total Protein 12/14/2022 7.7    Albumin 12/14/2022 4.0    GFR 12/14/2022 120.94    Calcium  12/14/2022 9.4    AFP-Tumor Marker 12/14/2022 2.5    Hep B S Ab 12/14/2022 REACTIVE (A)    Hepatitis A AB,Total 12/14/2022 REACTIVE (A)    Sed Rate 12/14/2022 40 (H)    CRP 12/14/2022 <1.0   Admission on 11/23/2022, Discharged on 11/23/2022  Component Date Value   SURGICAL PATHOLOGY 11/23/2022                     Value:SURGICAL PATHOLOGY CASE: [id number] PATIENT: Haider Najera Surgical Pathology Report     Clinical History: 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 STOMACH, BIOPSY:      Gastric antral mucosa with reactive gastropathy.      Gastric oxyntic mucosa with no significant diagnostic alteration.      No H. pylori identified on HE.      Negative for intestinal metaplasia or dysplasia.   GROSS DESCRIPTION:  Received in formalin are tan, soft tissue fragments that are submitted in toto. Number: 5 size:  0.2-0.4 cm blocks: 1 (GRP 11/15/2022)   Final Diagnosis performed by Dillard Frame, MD.   Electronically signed 11/24/2022 Technical component performed at [facility], [address] [place], [place] [address].  Professional component performed at [employer]. [facility], [address], [place], [place] [address].  Immunohistochemistry Technical component (if applicable) was performed                          at [facility]. [address], [place], [place] [address].   IMMUNOHISTOCHEMISTRY DISCLAIMER (if applicable): Some of these immunohistochemical stains may have been developed and the performance characteristics determine by [facility]. Some may not have been cleared or approved by the U.S. Food and Drug Administration. The FDA has determined that such clearance or approval is not necessary. This test is used for clinical purposes. It should not be regarded as investigational or for research. This laboratory is certified under the Clinical Laboratory Improvement Amendments of 1988 (CLIA-88) as qualified to perform high complexity clinical laboratory testing.  The controls stained appropriately.   Office Visit on 11/02/2022  Component Date Value   Rheumatoid fact SerPl-aC* 11/02/2022 <10    Cyclic Citrullin Peptide* 11/02/2022 <16    Testosterone  11/02/2022 362.20    VITD 11/02/2022 12.47 (L)    Sed Rate 11/02/2022 29 (H)    CRP 11/02/2022 <1.0    Anti Nuclear Antibody (A* 11/02/2022 POSITIVE (A)    Total CK 11/02/2022 119    Sodium 11/02/2022 137    Potassium 11/02/2022 4.1    Chloride 11/02/2022 101    CO2 11/02/2022 28    Glucose, Bld 11/02/2022 97    BUN 11/02/2022 10    Creatinine, Ser 11/02/2022 0.61    Total Bilirubin 11/02/2022 0.9  Alkaline Phosphatase 11/02/2022 105    AST 11/02/2022 37    ALT 11/02/2022 20    Total Protein 11/02/2022 6.8    Albumin 11/02/2022 3.6    GFR 11/02/2022 121.04     Calcium  11/02/2022 9.3    Testosterone  11/02/2022 649    Testosterone , Free 11/02/2022 2.1 (L)    Sex Hormone Binding 11/02/2022 139.0 (H)    WBC 11/02/2022 5.2    RBC 11/02/2022 3.91 (L)    Hemoglobin 11/02/2022 12.3 (L)    HCT 11/02/2022 37.4 (L)    MCV 11/02/2022 95.5    MCHC 11/02/2022 33.0    RDW 11/02/2022 15.5    Platelets 11/02/2022 221.0    Neutrophils Relative % 11/02/2022 46.5    Lymphocytes Relative 11/02/2022 36.5    Monocytes Relative 11/02/2022 14.3 (H)    Eosinophils Relative 11/02/2022 1.9    Basophils Relative 11/02/2022 0.8    Neutro Abs 11/02/2022 2.4    Lymphs Abs 11/02/2022 1.9    Monocytes Absolute 11/02/2022 0.7    Eosinophils Absolute 11/02/2022 0.1    Basophils Absolute 11/02/2022 0.0    ANA Titer 1 11/02/2022 1:40 (H)    ANA Pattern 1 11/02/2022 Mitotic, Spindle Fibers (A)    ANA TITER 11/02/2022 1:40 (H)    ANA PATTERN 11/02/2022 Cytoplasmic (A)    ANA TITER 11/02/2022 1:40 (H)    ANA PATTERN 11/02/2022 Nuclear, Fine Speckled (A)   No image results found. No results found.    Results LABS Hb: 12.1 g/dL (16/03/9603) Electrolytes: Normal (09/15/2023) Liver function: Normal (09/15/2023) Kidney function: Normal (09/15/2023) INR: 1 (09/15/2023)  RADIOLOGY Ultrasound: No hepatic malignancy, normal gallbladder function (03/2023)     Assessment & Plan Alcohol use disorder He is maintaining sobriety without cravings, continuing acamprosate  and trazodone . He no longer attends twelve-step programs but uses the '[employer]' app. Gabapentin  is discontinued as cravings have subsided. Continued sobriety is crucial to prevent relapse and potential liver failure. Continue acamprosate , refill trazodone , and encourage the use of the '[employer]' app. Insomnia, unspecified type  Restless legs  Undifferentiated inflammatory arthritis  Rash  Night sweats  Alcoholic cirrhosis, unspecified whether ascites present (HCC) Liver function has significantly  improved with sobriety. Previous imaging showed no liver cancer, and the gallbladder is functioning well. [facility] from [facility] indicate normal liver function and improved anemia. A liver transplant is not needed as the liver is functioning adequately. The risk of relapse leading to liver failure was discussed, emphasizing the importance of maintaining sobriety. No immediate imaging or further liver function tests are needed unless symptoms like hematemesis or melena occur. Continue monitoring liver function with periodic check-ins as advised by [facility]. Drug-induced hypokalemia Lab Results  Component Value Date/Time   K 4.0 04/13/2023 08:37 AM   K 4.3 12/14/2022 12:15 PM   K 4.1 11/02/2022 09:03 AM   K 3.6 08/24/2022 04:17 AM   K 3.4 (L) 08/23/2022 03:26 AM   K 3.1 (L) 08/22/2022 04:08 AM   K 3.1 (L) 08/21/2022 02:32 PM  Resolved per patient report, removed from problem list today   Other specified nutritional anemias Resolved per patient report, removed from problem list today after confirming [facility] labwork Low testosterone  in male Not low enough to warrant treatment(s). Lab Results  Component Value Date   TESTOSTERONE  328.34 04/13/2023   Vitamin D  deficiency Lab Results  Component Value Date   VD25OH 15.41 (L) 04/13/2023   VD25OH 12.47 (L) 11/02/2022  Encouraged patient to continue(s) with  ANA  positive Autoimmune liver injury was suspected but seems to have resolved, no further workup at this time. Erectile dysfunction, unspecified erectile dysfunction type Managed with sildenafil , he is interested in trying tadalafil  (Cialis ) for longer duration. Low testosterone  is noted but not low enough for TRT. Trazodone  and spironolactone  may contribute to reduced libido. Alternative options for testosterone  supplementation through online services were discussed, though not medically indicated. Discontinue spironolactone , prescribe tadalafil  (Cialis ) through [facility], and  encourage B12 supplementation to support nerve health and erections. Depression, unspecified depression type He has mild depression with a PHQ score of 12. No psychiatric referral or medication adjustment is needed. He reports occasional low mood, which is not disruptive to life. Continue current management without changes. Anemia has improved significantly, with hemoglobin levels nearly normal. No further management is needed at this time. Outside labs reviewed and this was removed from problem list        Orders Placed During this Encounter:  No orders of the defined types were placed in this encounter.  Meds ordered this encounter  Medications   acamprosate  (CAMPRAL ) 333 MG tablet    Sig: Take 2 tablets (666 mg total) by mouth 3 (three) times daily with meals.   traZODone  (DESYREL ) 50 MG tablet    Sig: Take 1 tablet (50 mg total) by mouth at bedtime.    Dispense:  90 tablet    Refill:  0   tadalafil  (CIALIS ) 20 MG tablet    Sig: Take 0.5-1 tablets (10-20 mg total) by mouth every other day as needed for erectile dysfunction.    Dispense:  90 tablet    Refill:  3   ED Discharge Orders          Ordered    acamprosate  (CAMPRAL ) 333 MG tablet  3 times daily with meals        10/20/23 1200    traZODone  (DESYREL ) 50 MG tablet  Daily at bedtime        10/20/23 1200    tadalafil  (CIALIS ) 20 MG tablet  Every 48 hours PRN        10/20/23 1200                       Additional Info: This encounter employed state-of-the-art, real-time, collaborative documentation. The patient actively reviewed and assisted in updating their electronic medical record on a shared screen, ensuring transparency and facilitating joint problem-solving for the problem list, overview, and plan. This approach promotes accurate, informed care. The treatment plan was discussed and reviewed in detail, including medication safety, potential side effects, and all patient questions. We confirmed understanding and  comfort with the plan. Follow-up instructions were established, including contacting the office for any concerns, returning if symptoms worsen, persist, or new symptoms develop, and precautions for potential emergency department visits.   This document was synthesized by artificial intelligence ([employer]) using HIPAA-compliant recording of the clinical interaction;   We discussed the use of AI scribe software for clinical note transcription with the patient, who gave verbal consent to proceed.

## 2023-10-20 NOTE — Patient Instructions (Addendum)
 [facility] - [place], [place] - [address] [phone]    VISIT SUMMARY:  John Maxwell, a 43 year old male, came in for a routine follow-up. He is maintaining sobriety from alcohol, and his liver function has significantly improved. He is managing mild depression and erectile dysfunction and has no current issues with anemia or restless legs syndrome. His overall health is stable, and he is advised to continue his current management plan.  YOUR PLAN:  -ALCOHOL USE DISORDER: Alcohol use disorder is a condition characterized by an inability to control alcohol consumption despite negative consequences. You are maintaining sobriety without cravings, continuing acamprosate  and trazodone . You no longer attend twelve-step programs but use the 'I Am Sober' app. Gabapentin  is discontinued as cravings have subsided. Continued sobriety is crucial to prevent relapse and potential liver failure. Continue acamprosate , refill trazodone , and keep using the 'I Am Sober' app.  -ALCOHOLIC CIRRHOSIS: Alcoholic cirrhosis is severe liver damage caused by long-term alcohol abuse. Your liver function has significantly improved with sobriety. Previous imaging showed no liver cancer, and the gallbladder is functioning well. Recent labs indicate normal liver function and improved anemia. A liver transplant is not needed as the liver is functioning adequately. The risk of relapse leading to liver failure was discussed, emphasizing the importance of maintaining sobriety. No immediate imaging or further liver function tests are needed unless symptoms like vomiting blood or black stools occur. Continue monitoring liver function with periodic check-ins as advised by [facility].  -ANEMIA: Anemia is a condition where you lack enough healthy red blood cells to carry adequate oxygen to your body's tissues. Your anemia has improved significantly, with hemoglobin levels nearly normal. No further management is  needed at this time.  -MILD DEPRESSION: Mild depression is a mood disorder causing persistent feelings of sadness and loss of interest. You have mild depression with a PHQ score of 12. No psychiatric referral or medication adjustment is needed. You report occasional low mood, which is not disruptive to life. Continue current management without changes.  -ERECTILE DYSFUNCTION: Erectile dysfunction is the inability to get or keep an erection firm enough for sexual intercourse. It is managed with sildenafil , and you are interested in trying tadalafil  (Cialis ) for longer duration. Low testosterone  is noted but not low enough for testosterone  replacement therapy. Trazodone  and spironolactone  may contribute to reduced libido. Alternative options for testosterone  supplementation through online services were discussed, though not medically indicated. Discontinue spironolactone , prescribe tadalafil  (Cialis ) through [facility], and encourage B12 supplementation to support nerve health and erections.  -RESTLESS LEGS SYNDROME: Restless legs syndrome is a condition that causes an uncontrollable urge to move your legs, usually due to an uncomfortable sensation. You have no current symptoms. Previously managed with ropinirole , which is no longer needed. Discontinue ropinirole .  -WELLNESS VISIT: During the routine wellness visit, your ongoing health conditions and management were assessed. You are doing well and may extend follow-up to yearly visits unless new issues arise. Schedule an annual wellness visit in one year with fasting labs to recheck cholesterol and liver function.  INSTRUCTIONS:  Schedule an annual wellness visit in one year with fasting labs to recheck cholesterol and liver function.   It was a pleasure seeing you today! Your health and satisfaction are our top priorities.  Scherrie Curt, MD  Your Providers PCP: Anthon Kins, MD,  [phone]) Referring Provider: Anthon Kins, MD,  [phone]) Care Team Provider: Karlis Overland, MD,  [phone]) Care Team Provider:  Renaye Carp, [place],  [phone])     NEXT STEPS: [x]  Early Intervention: Schedule sooner appointment, call our on-call services, or go to emergency room if there is any significant Increase in pain or discomfort New or worsening symptoms Sudden or severe changes in your health [x]  Flexible Follow-Up: We recommend a No follow-ups on file. for optimal routine care. This allows for progress monitoring and treatment adjustments. [x]  Preventive Care: Schedule your annual preventive care visit! It's typically covered by insurance and helps identify potential health issues early. [x]  Lab & X-ray Appointments: Incomplete tests scheduled today, or call to schedule. X-rays: [facility] (M-F, 8:30am-noon or 1pm-5pm). [x]  Medical Information Release: Sign a release form at front desk to obtain relevant medical information we don't have.  MAKING THE MOST OF OUR FOCUSED 20 MINUTE APPOINTMENTS: [x]   Clearly state your top concerns at the beginning of the visit to focus our discussion [x]   If you anticipate you will need more time, please inform the front desk during scheduling - we can book multiple appointments in the same week. [x]   If you have transportation problems- use our convenient video appointments or ask about transportation support. [x]   We can get down to business faster if you use MyChart to update information before the visit and submit non-urgent questions before your visit. Thank you for taking the time to provide details through MyChart.  Let our nurse know and she can import this information into your encounter documents.  Arrival and Wait Times: [x]   Arriving on time ensures that everyone receives prompt attention. [x]   Early morning (8a) and afternoon (1p) appointments tend to have shortest wait times. [x]   Unfortunately, we cannot delay appointments for late  arrivals or hold slots during phone calls.  Getting Answers and Following Up [x]   Simple Questions & Concerns: For quick questions or basic follow-up after your visit, reach us  at [phone] or MyChart messaging. [x]   Complex Concerns: If your concern is more complex, scheduling an appointment might be best. Discuss this with the staff to find the most suitable option. [x]   Lab & Imaging Results: We'll contact you directly if results are abnormal or you don't use MyChart. Most normal results will be on MyChart within 2-3 business days, with a review message from Dr. Boston Byers. Haven't heard back in 2 weeks? Need results sooner? Contact us  at [phone]. [x]   Referrals: Our referral coordinator will manage specialist referrals. The specialist's office should contact you within 2 weeks to schedule an appointment. Call us  if you haven't heard from them after 2 weeks.  Staying Connected [x]   MyChart: Activate your MyChart for the fastest way to access results and message us . See the last page of this paperwork for instructions on how to activate.  Bring to Your Next Appointment [x]   Medications: Please bring all your medication bottles to your next appointment to ensure we have an accurate record of your prescriptions. [x]   Health Diaries: If you're monitoring any health conditions at home, keeping a diary of your readings can be very helpful for discussions at your next appointment.  Billing [x]   X-ray & Lab Orders: These are billed by separate companies. Contact the invoicing company directly for questions or concerns. [x]   Visit Charges: Discuss any billing inquiries with our administrative services team.  Your Satisfaction Matters [x]   Share Your Experience: We strive for your satisfaction! If you have any complaints, or preferably compliments, please let Dr. Boston Byers know directly or contact  our Practice Administrators, Olinda Bertrand or [employer], by asking at the front desk.    Reviewing Your Records [x]   Review this early draft of your clinical encounter notes below and the final encounter summary tomorrow on MyChart after its been completed.  All orders placed so far are visible here: Alcohol use disorder -     Acamprosate  Calcium ; Take 2 tablets (666 mg total) by mouth 3 (three) times daily with meals.  Insomnia, unspecified type -     traZODone  HCl; Take 1 tablet (50 mg total) by mouth at bedtime.  Dispense: 90 tablet; Refill: 0  Restless legs  Undifferentiated inflammatory arthritis  Rash  Night sweats  Alcoholic cirrhosis, unspecified whether ascites present (HCC)  Drug-induced hypokalemia  Other specified nutritional anemias  Low testosterone  in male  Vitamin D  deficiency  ANA positive  Erectile dysfunction, unspecified erectile dysfunction type -     Tadalafil ; Take 0.5-1 tablets (10-20 mg total) by mouth every other day as needed for erectile dysfunction.  Dispense: 90 tablet; Refill: 3  Depression, unspecified depression type

## 2023-11-16 ENCOUNTER — Telehealth: Payer: Self-pay | Admitting: Internal Medicine

## 2023-11-16 NOTE — Telephone Encounter (Signed)
 Patient dropped off document [employer], to be filled out by provider. Patient requested to send it back via Call Patient to pick up within 7-days. Document is located in providers tray at front office.Please advise at Mobile [phone] (mobile)

## 2023-11-16 NOTE — Telephone Encounter (Signed)
Placed on provider desk to be filled out

## 2023-11-23 ENCOUNTER — Telehealth: Payer: Self-pay

## 2023-11-23 ENCOUNTER — Telehealth (INDEPENDENT_AMBULATORY_CARE_PROVIDER_SITE_OTHER): Admitting: Internal Medicine

## 2023-11-23 ENCOUNTER — Other Ambulatory Visit (INDEPENDENT_AMBULATORY_CARE_PROVIDER_SITE_OTHER)

## 2023-11-23 ENCOUNTER — Encounter: Payer: Self-pay | Admitting: Internal Medicine

## 2023-11-23 DIAGNOSIS — K7460 Unspecified cirrhosis of liver: Secondary | ICD-10-CM

## 2023-11-23 DIAGNOSIS — Z01818 Encounter for other preprocedural examination: Secondary | ICD-10-CM | POA: Diagnosis not present

## 2023-11-23 DIAGNOSIS — R52 Pain, unspecified: Secondary | ICD-10-CM | POA: Diagnosis not present

## 2023-11-23 DIAGNOSIS — Z0279 Encounter for issue of other medical certificate: Secondary | ICD-10-CM

## 2023-11-23 LAB — COMPREHENSIVE METABOLIC PANEL WITH GFR
ALT: 17 U/L (ref 0–53)
AST: 25 U/L (ref 0–37)
Albumin: 4.2 g/dL (ref 3.5–5.2)
Alkaline Phosphatase: 84 U/L (ref 39–117)
BUN: 16 mg/dL (ref 6–23)
CO2: 32 meq/L (ref 19–32)
Calcium: 8.8 mg/dL (ref 8.4–10.5)
Chloride: 101 meq/L (ref 96–112)
Creatinine, Ser: 0.67 mg/dL (ref 0.40–1.50)
GFR: 116.78 mL/min (ref >60.00)
Glucose, Bld: 100 mg/dL — ABNORMAL HIGH (ref 70–99)
Potassium: 4 meq/L (ref 3.5–5.1)
Sodium: 139 meq/L (ref 135–145)
Total Bilirubin: 0.4 mg/dL (ref 0.2–1.2)
Total Protein: 7 g/dL (ref 6.0–8.3)

## 2023-11-23 LAB — CBC WITH DIFFERENTIAL/PLATELET
Basophils Absolute: 0 10*3/uL (ref 0.0–0.1)
Basophils Relative: 0.6 % (ref 0.0–3.0)
Eosinophils Absolute: 0 10*3/uL (ref 0.0–0.7)
Eosinophils Relative: 1 % (ref 0.0–5.0)
HCT: 37 % — ABNORMAL LOW (ref 39.0–52.0)
Hemoglobin: 12.3 g/dL — ABNORMAL LOW (ref 13.0–17.0)
Lymphocytes Relative: 46.6 % — ABNORMAL HIGH (ref 12.0–46.0)
Lymphs Abs: 1.8 10*3/uL (ref 0.7–4.0)
MCHC: 33.1 g/dL (ref 30.0–36.0)
MCV: 92.2 fl (ref 78.0–100.0)
Monocytes Absolute: 0.3 10*3/uL (ref 0.1–1.0)
Monocytes Relative: 8.9 % (ref 3.0–12.0)
Neutro Abs: 1.7 10*3/uL (ref 1.4–7.7)
Neutrophils Relative %: 42.9 % — ABNORMAL LOW (ref 43.0–77.0)
Platelets: 150 10*3/uL (ref 150.0–400.0)
RBC: 4.01 Mil/uL — ABNORMAL LOW (ref 4.22–5.81)
RDW: 13.8 % (ref 11.5–15.5)
WBC: 3.9 10*3/uL — ABNORMAL LOW (ref 4.0–10.5)

## 2023-11-23 LAB — PROTIME-INR
INR: 1 ratio (ref 0.8–1.0)
Prothrombin Time: 10.6 s (ref 9.6–13.1)

## 2023-11-23 MED ORDER — CELECOXIB 100 MG PO CAPS
100.0000 mg | ORAL_CAPSULE | Freq: Two times a day (BID) | ORAL | 2 refills | Status: DC
Start: 2023-11-23 — End: 2024-02-29

## 2023-11-23 NOTE — Progress Notes (Signed)
 ==============================  [facility]: [phone]   Virtual Medical Office Visit - Video Telemedicine   Patient:  John Maxwell (1981-05-14) located at home MRN:   [id number]      Date:   11/23/2023  PCP:    Anthon Kins, MD   Today's Healthcare Provider: Anthon Kins, MD located at office: [facility] [address], [place] [address] Today's Telemedicine visit was conducted via Video after consent for telemedicine was obtained:  Video connection was never lost All video encounter participant identities and locations confirmed visually and verbally.  ==============================   Chief Complaint: Review dental paper work  Discussed the use of AI scribe software for clinical note transcription with the patient, who gave verbal consent to proceed.  History of Present Illness 43 year old male with liver damage who presents for preoperative evaluation for dental extractions.  He is scheduled for dental extractions on December 08, 2023, due to concerns about bleeding risk associated with his liver damage. He is expected to have eight teeth extracted. No significant bleeding events in the past year.  He has a history of liver damage affecting blood clotting factors and causing splenomegaly with decreased platelet count. Despite this, his blood counts were reportedly good in October, and he has not experienced any significant bleeding issues.  He is not currently taking any blood thinners, supplements, or herbals that could impact bleeding. He denies alcohol consumption. His current medication includes trazodone  and a fertility supplement. He has previously used lidocaine  without issues and has no known allergies to anesthetics. He has used opioids like Vicodin in the past for pain management without issues.  He has a history of well-compensated cirrhosis and has undergone surgeries in the past, including  an appendectomy, without complications.  No recent stomach discomfort, indigestion, heartburn, or chest pain. No known heart problems, although he has experienced panic attacks in the past, attributed to anxiety rather than cardiac issues.  Background Reviewed: Problem List: has Alcohol use disorder; Alcoholic cirrhosis of liver (HCC); Anemia; Folate deficiency; Portal hypertension (HCC); Restless legs; Undifferentiated inflammatory arthritis; Erectile dysfunction; Other fatigue; Insomnia; ANA positive; Low testosterone  in male; Reactive gastropathy; Rash; Night sweats; Vitamin D  deficiency; Hepatic osteodystrophy; and Autoimmune disorder (HCC) on their problem list. 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 Past Surgical History:   has a past surgical history that includes Appendectomy; IR Paracentesis (09/03/2022); Esophagogastroduodenoscopy (egd) with propofol  (N/A, 11/23/2022); and biopsy (11/23/2022). Social History:   reports that he has quit smoking. His smoking use included cigarettes. He has never used smokeless tobacco. He reports current alcohol use. He reports that he does not use drugs. Family History:  family history includes Depression in his father; Diabetes in his father; Heart attack in his father; Heart disease in  his father; Mental illness in his father. Allergies:  is allergic to cyclobenzaprine and menthol (topical analgesic).   Medication: Current Outpatient Medications on File Prior to Visit  Medication Sig   acamprosate  (CAMPRAL ) 333 MG tablet Take 2  tablets (666 mg total) by mouth 3 (three) times daily with meals.   folic acid  (FOLVITE ) 1 MG tablet Take 1 tablet (1 mg total) by mouth daily.   sildenafil  (VIAGRA ) 100 MG tablet Take 0.5-1 tablets (50-100 mg total) by mouth daily as needed for erectile dysfunction.   tadalafil  (CIALIS ) 20 MG tablet Take 0.5-1 tablets (10-20 mg total) by mouth every other day as needed for erectile dysfunction.   thiamine  (VITAMIN B-1) 100 MG tablet Take 1 tablet (100 mg total) by mouth daily.   traZODone  (DESYREL ) 50 MG tablet Take 1 tablet (50 mg total) by mouth at bedtime.   Vitamin D , Ergocalciferol , (DRISDOL ) 1.25 MG (50000 UNIT) CAPS capsule Take 1 capsule (50,000 Units total) by mouth every 7 (seven) days.   No current facility-administered medications on file prior to visit.  There are no discontinued medications.   Physical Exam:    10/20/2023   11:03 AM 05/30/2023    9:57 AM 04/13/2023    8:00 AM  Vitals with BMI  Height 5\' 7"  5\' 7"  5\' 7"   Weight 144 lbs 151 lbs 13 oz 154 lbs 6 oz  BMI 22.55 23.77 24.18  Systolic 122 114 604  Diastolic 70 64 74  Pulse 85 80 77  Vital signs reviewed.  Nursing notes reviewed. Weight trend reviewed. Physical Exam Exam Context: Evaluation limited by virtual format; however, patient is clearly visualized, cooperative, and engaged throughout. General Appearance: Well-developed, well-nourished; no acute distress by limited video assessment. Pulmonary: No respiratory distress apparent; normal work of breathing observed. Neurological: Patient is awake, alert, and demonstrates no obvious focal neurological deficits or cognitive impairments; sensorium appears unclouded. Psychiatric/Mental Status: Mood is appropriate;  demeanor is pleasant, calm, and articulate. Speech is coherent and goal-directed with no evidence of slurred or pressured speech. No abnormal psychomotor activity noted. Substance Misuse Indicators: Pupils appear symmetric and reactive as far as can be assessed via video. No track marks, skin lesions, or other stigmata of substance misuse visible. No signs of intoxication or withdrawal are evident.       Results for orders placed or performed in visit on 11/23/23  Protime-INR  Result Value Ref Range   INR 1.0 0.8 - 1.0 ratio   Prothrombin Time 10.6 9.6 - 13.1 sec  CBC with Differential/Platelet  Result Value Ref Range   WBC 3.9 (L) 4.0 - 10.5 K/uL   RBC 4.01 (L) 4.22 - 5.81 Mil/uL   Hemoglobin 12.3 (L) 13.0 - 17.0 g/dL   HCT 54.0 (L) 98.1 - 19.1 %   MCV 92.2 78.0 - 100.0 fl   MCHC 33.1 30.0 - 36.0 g/dL   RDW 47.8 29.5 - 62.1 %   Platelets 150.0 150.0 - 400.0 K/uL   Neutrophils Relative % 42.9 (L) 43.0 - 77.0 %   Lymphocytes Relative 46.6 (H) 12.0 - 46.0 %   Monocytes Relative 8.9 3.0 - 12.0 %   Eosinophils Relative 1.0 0.0 - 5.0 %   Basophils Relative 0.6 0.0 - 3.0 %   Neutro Abs 1.7 1.4 - 7.7 K/uL   Lymphs Abs 1.8 0.7 - 4.0 K/uL   Monocytes Absolute 0.3 0.1 - 1.0 K/uL   Eosinophils Absolute 0.0 0.0 - 0.7 K/uL   Basophils Absolute 0.0 0.0 - 0.1 K/uL  Comp Met (CMET)  Result Value Ref Range   Sodium 139 135 - 145 mEq/L   Potassium 4.0 3.5 - 5.1 mEq/L   Chloride 101 96 - 112 mEq/L   CO2 32 19 - 32  mEq/L   Glucose, Bld 100 (H) 70 - 99 mg/dL   BUN 16 6 - 23 mg/dL   Creatinine, Ser 1.61 0.40 - 1.50 mg/dL   Total Bilirubin 0.4 0.2 - 1.2 mg/dL   Alkaline Phosphatase 84 39 - 117 U/L   AST 25 0 - 37 U/L   ALT 17 0 - 53 U/L   Total Protein 7.0 6.0 - 8.3 g/dL   Albumin 4.2 3.5 - 5.2 g/dL   GFR 096.04 >54.09 mL/min   Calcium  8.8 8.4 - 10.5 mg/dL   Lab on 81/19/1478  Component Date Value   INR 11/23/2023 1.0    Prothrombin Time 11/23/2023 10.6    WBC 11/23/2023 3.9 (L)     RBC 11/23/2023 4.01 (L)    Hemoglobin 11/23/2023 12.3 (L)    HCT 11/23/2023 37.0 (L)    MCV 11/23/2023 92.2    MCHC 11/23/2023 33.1    RDW 11/23/2023 13.8    Platelets 11/23/2023 150.0    Neutrophils Relative % 11/23/2023 42.9 (L)    Lymphocytes Relative 11/23/2023 46.6 (H)    Monocytes Relative 11/23/2023 8.9    Eosinophils Relative 11/23/2023 1.0    Basophils Relative 11/23/2023 0.6    Neutro Abs 11/23/2023 1.7    Lymphs Abs 11/23/2023 1.8    Monocytes Absolute 11/23/2023 0.3    Eosinophils Absolute 11/23/2023 0.0    Basophils Absolute 11/23/2023 0.0    Sodium 11/23/2023 139    Potassium 11/23/2023 4.0    Chloride 11/23/2023 101    CO2 11/23/2023 32    Glucose, Bld 11/23/2023 100 (H)    BUN 11/23/2023 16    Creatinine, Ser 11/23/2023 0.67    Total Bilirubin 11/23/2023 0.4    Alkaline Phosphatase 11/23/2023 84    AST 11/23/2023 25    ALT 11/23/2023 17    Total Protein 11/23/2023 7.0    Albumin 11/23/2023 4.2    GFR 11/23/2023 116.78    Calcium  11/23/2023 8.8   Office Visit on 04/13/2023  Component Date Value   Cholesterol 04/13/2023 184    Triglycerides 04/13/2023 73.0    HDL 04/13/2023 73.20    VLDL 04/13/2023 14.6    LDL Cholesterol 04/13/2023 96    Total CHOL/HDL Ratio 04/13/2023 3    NonHDL 04/13/2023 110.79    Sodium 04/13/2023 137    Potassium 04/13/2023 4.0    Chloride 04/13/2023 102    CO2 04/13/2023 29    Glucose, Bld 04/13/2023 99    BUN 04/13/2023 11    Creatinine, Ser 04/13/2023 0.65    Total Bilirubin 04/13/2023 0.6    Alkaline Phosphatase 04/13/2023 136 (H)    AST 04/13/2023 23    ALT 04/13/2023 18    Total Protein 04/13/2023 7.0    Albumin 04/13/2023 4.2    GFR 04/13/2023 118.37    Calcium  04/13/2023 9.3    WBC 04/13/2023 5.3    RBC 04/13/2023 4.18 (L)    Hemoglobin 04/13/2023 12.7 (L)    HCT 04/13/2023 38.8 (L)    MCV 04/13/2023 92.6    MCHC 04/13/2023 32.9    RDW 04/13/2023 13.6    Platelets 04/13/2023 181.0    Neutrophils Relative %  04/13/2023 61.8    Lymphocytes Relative 04/13/2023 30.3    Monocytes Relative 04/13/2023 7.0    Eosinophils Relative 04/13/2023 0.5    Basophils Relative 04/13/2023 0.4    Neutro Abs 04/13/2023 3.3    Lymphs Abs 04/13/2023 1.6    Monocytes Absolute 04/13/2023 0.4    Eosinophils Absolute  04/13/2023 0.0    Basophils Absolute 04/13/2023 0.0    TSH 04/13/2023 1.330    Cholesterol 04/13/2023 185    HDL 04/13/2023 74    Triglycerides 04/13/2023 72    LDL Cholesterol (Calc) 04/13/2023 95    Total CHOL/HDL Ratio 04/13/2023 2.5    Non-HDL Cholesterol (Cal* 04/13/2023 111    VITD 04/13/2023 15.41 (L)    Anti Nuclear Antibody (A* 04/13/2023 Positive (A)    dsDNA Ab 04/13/2023 11 (H)    ENA RNP Ab 04/13/2023 0.2    ENA SM Ab Ser-aCnc 04/13/2023 <0.2    Scleroderma (Scl-70) (EN* 04/13/2023 <0.2    ENA SSA (RO) Ab 04/13/2023 <0.2    ENA SSB (LA) Ab 04/13/2023 <0.2    Chromatin Ab SerPl-aCnc 04/13/2023 <0.2    Anti JO-1 04/13/2023 <0.2    Centromere Ab Screen 04/13/2023 <0.2    See below: 04/13/2023 Comment    Sed Rate 04/13/2023 19 (H)    CRP 04/13/2023 <1.0    Iron 04/13/2023 103    Transferrin 04/13/2023 222.0    Saturation Ratios 04/13/2023 33.1    Ferritin 04/13/2023 52.4    TIBC 04/13/2023 310.8    Testosterone  04/13/2023 328.34   Office Visit on 12/14/2022  Component Date Value   WBC 12/14/2022 3.6 (L)    RBC 12/14/2022 4.41    Hemoglobin 12/14/2022 13.3    HCT 12/14/2022 39.7    MCV 12/14/2022 90.1    MCHC 12/14/2022 33.4    RDW 12/14/2022 13.5    Platelets 12/14/2022 170.0    Neutrophils Relative % 12/14/2022 52.3    Lymphocytes Relative 12/14/2022 33.7    Monocytes Relative 12/14/2022 11.3    Eosinophils Relative 12/14/2022 2.2    Basophils Relative 12/14/2022 0.5    Neutro Abs 12/14/2022 1.9    Lymphs Abs 12/14/2022 1.2    Monocytes Absolute 12/14/2022 0.4    Eosinophils Absolute 12/14/2022 0.1    Basophils Absolute 12/14/2022 0.0    Sodium 12/14/2022 137     Potassium 12/14/2022 4.3    Chloride 12/14/2022 101    CO2 12/14/2022 29    Glucose, Bld 12/14/2022 93    BUN 12/14/2022 13    Creatinine, Ser 12/14/2022 0.61    Total Bilirubin 12/14/2022 0.4    Alkaline Phosphatase 12/14/2022 120 (H)    AST 12/14/2022 32    ALT 12/14/2022 22    Total Protein 12/14/2022 7.7    Albumin 12/14/2022 4.0    GFR 12/14/2022 120.94    Calcium  12/14/2022 9.4    AFP-Tumor Marker 12/14/2022 2.5    Hep B S Ab 12/14/2022 REACTIVE (A)    Hepatitis A AB,Total 12/14/2022 REACTIVE (A)    Sed Rate 12/14/2022 40 (H)    CRP 12/14/2022 <1.0   No image results found. No results found.      05/30/2023   10:03 AM 04/13/2023    8:09 AM 11/24/2022   10:22 AM 08/19/2022   11:09 AM  PHQ 2/9 Scores  PHQ - 2 Score 3 4 2 3   PHQ- 9 Score 12 14 10 11    Results LABS Complete Blood Count (CBC): Within normal limits (03/2023)   Photographs Taken 11/23/2023 :    Assessment & Plan Cirrhosis of liver without ascites, unspecified hepatic cirrhosis type (HCC) Alcoholic cirrhosis   He has well-compensated cirrhosis with a risk of bleeding due to impaired liver function affecting clotting factors. Scheduled dental extractions increase this risk. He has not experienced significant bleeding events recently and remains abstinent  from alcohol. Blood counts from October were adequate, but preoperative blood work is recommended to ensure safety. He is at high risk for late rebleeding due to weak clot formation post-surgery. He has sufficient platelets for initial clot formation, but clotting factor deficiency may lead to clot instability and potential bleeding complications. Order preoperative blood work to check blood counts. Advise against NSAIDs like ibuprofen  and Aleve  due to bleeding risk. Prescribe Celebrex  for severe pain perioperatively, with caution on frequency of use. Allow use of Tylenol, not exceeding 2 grams per day. Inform dentist about potential use of cryoprecipitate if  bleeding is difficult to control. Advise dentist to limit lidocaine  use due to liver metabolism concerns. Avoid certain antibiotics that may pose a risk.  Alcohol use disorder   He reports abstaining from alcohol, which is crucial for managing cirrhosis and reducing bleeding risk. Continued abstinence is important for overall health and surgical safety. Encourage continued abstinence from alcohol. Preop examination Completed paperwork and gave to certified medical assistant to fax to dental office. Arranged bloodwork for preoperative safety but cirrhosis is compensated so outpatient management should be ok.  Pain Encouraged patient to to use celecoxib  as only safe NSAID for pain, tylenol within limits and okd hydrocodone to be prescribed by dentist for perioperative pain.        Orders Placed During this Encounter:   Orders Placed This Encounter  Procedures   Protime-INR    Standing Status:   Future    Number of Occurrences:   1    Expiration Date:   11/22/2024   Fibrinogen    Standing Status:   Future    Number of Occurrences:   1    Expiration Date:   11/22/2024   Comp Met (CMET)    Standing Status:   Future    Number of Occurrences:   1    Expiration Date:   11/22/2024   CBC with Differential/Platelet    Standing Status:   Future    Number of Occurrences:   1    Expiration Date:   11/22/2024   Meds ordered this encounter  Medications   celecoxib  (CELEBREX ) 100 MG capsule    Sig: Take 1 capsule (100 mg total) by mouth 2 (two) times daily. As needed for severe pain.    Dispense:  60 capsule    Refill:  2   ED Discharge Orders          Ordered    CBC with Differential/Platelet  Status:  Canceled        11/23/23 0855    Comp Met (CMET)  Status:  Canceled        11/23/23 0855    Protime-INR        11/23/23 0855    Fibrinogen  Status:  Canceled        11/23/23 0855    celecoxib  (CELEBREX ) 100 MG capsule  2 times daily        11/23/23 0855    Fibrinogen        11/23/23  0942    Comp Met (CMET)        11/23/23 0942    CBC with Differential/Platelet        11/23/23 5784           Medical Decision Making:     Ordering of each unique test; Decision regarding minor surgery with identified patient or procedure risk factors      This document was synthesized by artificial intelligence ([employer]bridge) using  HIPAA-compliant recording of the clinical interaction;   We discussed the use of AI scribe software for clinical note transcription with the patient, who gave verbal consent to proceed. additional Info: This encounter employed state-of-the-art, real-time, collaborative documentation. The patient actively reviewed and assisted in updating their electronic medical record on a shared screen, ensuring transparency and facilitating joint problem-solving for the problem list, overview, and plan. This approach promotes accurate, informed care. The treatment plan was discussed and reviewed in detail, including medication safety, potential side effects, and all patient questions. We confirmed understanding and comfort with the plan. Follow-up instructions were established, including contacting the office for any concerns, returning if symptoms worsen, persist, or new symptoms develop, and precautions for potential emergency department visits.

## 2023-11-23 NOTE — Telephone Encounter (Signed)
 Dental paper work is done and faxed over to [facility] at [phone] today. Pt is aware had appt today with provider.

## 2023-11-24 ENCOUNTER — Ambulatory Visit: Payer: Self-pay | Admitting: Internal Medicine

## 2023-11-24 LAB — FIBRINOGEN: Fibrinogen: 287 mg/dL (ref 175–425)

## 2024-01-25 ENCOUNTER — Other Ambulatory Visit: Payer: Self-pay | Admitting: Internal Medicine

## 2024-01-25 DIAGNOSIS — G4700 Insomnia, unspecified: Secondary | ICD-10-CM

## 2024-01-26 ENCOUNTER — Ambulatory Visit: Admitting: Internal Medicine

## 2024-02-29 ENCOUNTER — Other Ambulatory Visit: Payer: Self-pay | Admitting: Internal Medicine

## 2024-02-29 ENCOUNTER — Telehealth: Payer: Self-pay

## 2024-02-29 DIAGNOSIS — F1090 Alcohol use, unspecified, uncomplicated: Secondary | ICD-10-CM

## 2024-02-29 DIAGNOSIS — R52 Pain, unspecified: Secondary | ICD-10-CM

## 2024-02-29 DIAGNOSIS — G2581 Restless legs syndrome: Secondary | ICD-10-CM

## 2024-02-29 DIAGNOSIS — G4700 Insomnia, unspecified: Secondary | ICD-10-CM

## 2024-02-29 DIAGNOSIS — N529 Male erectile dysfunction, unspecified: Secondary | ICD-10-CM

## 2024-02-29 NOTE — Telephone Encounter (Signed)
 Called pt back via phone pt stated he does not have appt for while but wanted to know if he should still take trazodone  and celebrex  every day or not. He would like for the provider to advise I could call him back.

## 2024-02-29 NOTE — Telephone Encounter (Signed)
 Copied from CRM #[id number]. Topic: Clinical - Medication Question >> Feb 29, 2024 12:24 PM Chiquita SQUIBB wrote: Reason for CRM: Patient is calling in for clarity on which medications he should still be taking.  Spoke with pt have note on this already

## 2024-03-01 ENCOUNTER — Ambulatory Visit (INDEPENDENT_AMBULATORY_CARE_PROVIDER_SITE_OTHER): Admitting: Internal Medicine

## 2024-03-01 ENCOUNTER — Encounter: Payer: Self-pay | Admitting: Internal Medicine

## 2024-03-01 VITALS — BP 136/70 | HR 93 | Temp 98.0°F | Ht 67.0 in | Wt 158.4 lb

## 2024-03-01 DIAGNOSIS — F1090 Alcohol use, unspecified, uncomplicated: Secondary | ICD-10-CM

## 2024-03-01 DIAGNOSIS — F32A Depression, unspecified: Secondary | ICD-10-CM | POA: Diagnosis not present

## 2024-03-01 DIAGNOSIS — F419 Anxiety disorder, unspecified: Secondary | ICD-10-CM | POA: Diagnosis not present

## 2024-03-01 DIAGNOSIS — G2581 Restless legs syndrome: Secondary | ICD-10-CM

## 2024-03-01 MED ORDER — GABAPENTIN 400 MG PO CAPS: 400.0000 mg | ORAL_CAPSULE | Freq: Three times a day (TID) | ORAL | 1 refills | Status: DC

## 2024-03-01 MED ORDER — SERTRALINE HCL 50 MG PO TABS: 50.0000 mg | ORAL_TABLET | Freq: Every day | ORAL | 3 refills | Status: DC

## 2024-03-01 MED ORDER — ACAMPROSATE CALCIUM 333 MG PO TBEC: 666.0000 mg | DELAYED_RELEASE_TABLET | Freq: Three times a day (TID) | ORAL | Status: AC

## 2024-03-01 NOTE — Telephone Encounter (Signed)
Pt is coming in today for appt

## 2024-03-01 NOTE — Assessment & Plan Note (Signed)
 He reports cravings and anxiety, indicating a risk of relapse. He has stopped taking medications for cravings management, including acamprosate . Although no recent alcohol use is reported, there is a significant risk of relapse due to anxiety and depression. Restart gabapentin  to help with anxiety and cravings. Restart acamprosate  to reduce cravings and risk of relapse. Provide information on [facility] for crisis support. Encourage follow-up every 1-2 weeks to monitor progress and prevent relapse.  Major depressive disorder and anxiety disorder   He is experiencing worsening anxiety and lack of motivation, reporting mixed anxiety and depression disorder. He has stopped taking medications, leading to increased symptoms. There is no current suicidal plan, but he has thoughts and a family history of suicide. Start sertraline  (Zoloft ) as an SSRI to address anxiety and depression. Make urgent referrals to psychiatry and psychology for further support. Discuss the potential need for inpatient care if outpatient management is insufficient. Encourage use of trazodone  for sleep, with gabapentin  at bedtime to aid sleep through the night.

## 2024-03-01 NOTE — Patient Instructions (Addendum)
 ?? YOUR LIFELINE: [facility]   ?? IMMEDIATE HELP AVAILABLE - NO APPOINTMENT NEEDED ?? Address: [address] [place], [place] [address] ?? Direct Line: [phone] ?? Hours: Open 24/7 - Every Day of the Year ?? Cost: Accepts Most Insurance Plans Including Medicaid    When Should You Go to the [facility]?  YOU SHOULD GO IF YOU'RE EXPERIENCING:  ?? Depression symptoms - Can't sleep or sleeping too much, no appetite or eating too much, feelings of worthlessness ?? Overwhelming anxiety that's interfering with your daily life ?? Substance use concerns - drinking or drug use that's gotten out of control ?? Troubling thoughts - thoughts of hurting yourself or others ?? Mood swings - extreme highs and lows that are disrupting your relationships ?? Confusion or disorientation - feeling disconnected from reality ?? Crisis situations - when you feel like you can't cope and need immediate support Why Patients Love This Center:     ?? A Better Care in Mind - This is Their Motto and Promise This is [facility] 's first dedicated behavioral health urgent care facility - specially designed for mental health and substance use crises, not medical emergencies.   ?? What You Get ?? Why This Matters for You  No Wait Lists Walk in any time, day or night - you'll be seen promptly when you're in crisis  Specialized Environment Calming, therapeutic space designed specifically for mental health - not a scary emergency room  Expert Team Mental health clinicians and medical providers who understand exactly what you're going through  Comprehensive Care Assessment, crisis stabilization, safety planning, and connection to ongoing support  Natural Light & Comfort Large windows, comfortable spaces, and a healing courtyard - feels more like a wellness center  Complete Services Under One Roof: ?? Assessment Thorough mental health evaluation with immediate safety planning   ?? Crisis Stabilization 2-24 hours of intensive support until you're feeling safer and more stable  ??? Short-term Admission 16-bed crisis center for 2-5 day stays when you need more intensive support  ?? Medication Support On-site pharmacy and medication management by psychiatrists  ?? Peer Counseling Connect with others who understand your experience firsthand  ?? Care Coordination Seamless connection to ongoing therapy, psychiatry, and community resources  ? HOW LONG WILL YOU BE THERE?  Assessment: About 1 hour  Urgent Care: 2-24 hours (until you're stable)  Crisis Center Stay: 2-5 days if you need intensive support  What to Expect When You Arrive: ?? Welcoming Reception: Friendly staff who understand you're having a difficult time ?? Quick Check-in: Brief registration process - they'll handle insurance details ?? Medical Screening: Quick check to make sure you're medically stable ?? Mental Health Assessment: Private conversation with a trained clinician about what's happening ?? Safety Planning: Working together to create a plan to keep you safe ?? Next Steps: Connection to ongoing care and resources in the community ???????? FAMILY SUPPORT INCLUDED: Your loved ones can be involved in your care. The team encourages family participation and will help coordinate with those supporting you at home.    ?? AGES SERVED: Children (ages 70-17) and Adults - specialized care for each age group with age-appropriate environments and treatment approaches.  ?? Getting There: Address: [address] [place], [place] [address] Parking: Free parking Conservator, museum/gallery Transit: Accessible via [employer] bus routes Mobile Crisis: If you can't get there, call [phone] for mobile crisis team ?? Insurance & Payment: Insurance Accepted What This Means for You    Medicaid (All Plans) Full coverage - no cost to you  [employer] Covered like any urgent care visit  Self-Pay Options Financial  assistance available - don't let cost stop you from getting help   ?? WHEN TO CALL 911 INSTEAD: You're having a medical emergency (chest pain, difficulty breathing, severe injury) You've overdosed on drugs or alcohol You're actively trying to harm yourself or someone else ?? FOR IMMEDIATE CRISIS SUPPORT: 988: National Suicide & Crisis Lifeline (call or text) 269-344-5049: Mobile Crisis Team (they come to you)         ?? What Patients Say: Instead of sitting in an ER for hours feeling judged, I was seen immediately by people who understood exactly what I was going through. They helped me create a safety plan and connected me with a therapist the same week. The environment was so calming - nothing like what I expected. It felt more like a wellness center than a hospital.  ?? TRUSTED PARTNERSHIP: This facility is a collaboration between [employer] and [employer] - bringing together the best of public health commitment and Actor.  ?? Special Programs & Resources: ???? Fully Accessible: ADA compliant for all physical abilities ?? Multilingual Support: Interpreters available for non-English speakers ????? LGBTQ+ Affirming: Safe, respectful care for all identities ??? Veteran-Friendly: Staff trained in [employer] culture and trauma ?? Trauma-Informed Care: Understanding of how trauma affects healing ??? EASY TO FIND   From [address] ([address]), turn left on [address]  From [employer] on [address], turn right on [address]  Landmarks Near [facility]   ?? YOU DON'T HAVE TO SUFFER ALONE This facility exists because your mental health matters. The community invested $20 million to make sure you have a place to go when you're struggling. You deserve help, you deserve to feel better, and you deserve to be treated with dignity and respect. ?? Call [phone] or just walk in - someone is waiting to help you  24/7.    ?? QUICK REFERENCE: Address: [address], [place], [place] [address] Phone: [phone] Hours: 24/7/365 - Always Open Cost: Covered by most insurance, financial assistance available No Appointment Needed - Just come when you need help    It was a pleasure seeing you today! Your health and satisfaction are our top priorities.  Bernardino Cone, MD  VISIT SUMMARY: During today's visit, we discussed your worsening anxiety and cravings, as well as your current medications and mental health concerns. We reviewed your history of alcohol use disorder and the importance of managing cravings to prevent relapse. We also addressed your anxiety and depression, including your thoughts of self-harm and family history of suicide. We discussed the need for medication adjustments and referrals to mental health specialists for further support.  YOUR PLAN: -ALCOHOL USE DISORDER: Alcohol use disorder is a condition where you have a strong desire to drink alcohol despite knowing the negative consequences. To help manage your cravings and anxiety, we will restart gabapentin  and acamprosate . Gabapentin  will help with anxiety and cravings, while acamprosate  will reduce cravings and the risk of relapse. We also provided information on [facility] for crisis support and encourage follow-up every 1-2 weeks to monitor your progress.  -MAJOR DEPRESSIVE DISORDER AND ANXIETY DISORDER: Major depressive disorder and anxiety disorder are conditions that cause persistent feelings of sadness, lack of motivation, and excessive worry. To address your worsening anxiety and lack of motivation, we  will start you on sertraline  (Zoloft ), which is an SSRI that helps with both anxiety and depression. We also made urgent referrals to psychiatry and psychology for further support. If outpatient management is not enough, we may consider inpatient care. Continue using trazodone  for sleep, and take gabapentin   at bedtime to help you sleep through the night.  -CIRRHOSIS OF LIVER WITHOUT ASCITES: Cirrhosis of the liver is a condition where the liver is scarred and its function is impaired. There was no specific discussion about your cirrhosis during this visit, but it remains an important part of your overall health that we will continue to monitor.  -GOALS OF CARE: We discussed the importance of addressing your suicidal thoughts and the potential need for inpatient care if outpatient management is insufficient. We provided information on [facility] for crisis support and encourage you to maintain open communication with your family and support systems.  INSTRUCTIONS: Please follow up every 1-2 weeks to monitor your progress and prevent relapse. We have made urgent referrals to psychiatry and psychology for further support. If you experience any crisis or worsening symptoms, please contact [facility] for immediate assistance.  Your Providers PCP: Jesus Bernardino MATSU, MD,  [phone]) Referring Provider: Jesus Bernardino MATSU, MD,  [phone]) Care Team Provider: Myrna Shaver, MD,  [phone]) Care Team Provider: Kriss Estefana DEL, DO,  [phone])  NEXT STEPS: [x]  Early Intervention: Schedule sooner appointment, call our on-call services, or go to emergency room if there is any significant Increase in pain or discomfort New or worsening symptoms Sudden or severe changes in your health [x]  Flexible Follow-Up: We recommend a Return in about 1 week (around 03/08/2024) for 1-2 weeks greatly needed. over book if absolutely necessary. for optimal routine care. This allows for progress monitoring and treatment adjustments. [x]  Preventive Care: Schedule your annual preventive care visit! It's typically covered by insurance and helps identify potential health issues early. [x]  Lab & X-ray Appointments: Incomplete tests scheduled today, or call  to schedule. X-rays: [facility] (M-F, 8:30am-noon or 1pm-5pm). [x]  Medical Information Release: Sign a release form at front desk to obtain relevant medical information we don't have.  MAKING THE MOST OF OUR FOCUSED 20 MINUTE APPOINTMENTS: [x]   Clearly state your top concerns at the beginning of the visit to focus our discussion [x]   If you anticipate you will need more time, please inform the front desk during scheduling - we can book multiple appointments in the same week. [x]   If you have transportation problems- use our convenient video appointments or ask about transportation support. [x]   We can get down to business faster if you use MyChart to update information before the visit and submit non-urgent questions before your visit. Thank you for taking the time to provide details through MyChart.  Let our nurse know and she can import this information into your encounter documents.  Arrival and Wait Times: [x]   Arriving on time ensures that everyone receives prompt attention. [x]   Early morning (8a) and afternoon (1p) appointments tend to have shortest wait times. [x]   Unfortunately, we cannot delay appointments for late arrivals or hold slots during phone calls.  Getting Answers and Following Up [x]   Simple Questions & Concerns: For quick questions or basic follow-up after your visit, reach us  at [phone] or MyChart messaging. [x]   Complex Concerns: If your concern is more complex, scheduling an appointment might be best. Discuss this with the staff to find the most  suitable option. [x]   Lab & Imaging Results: We'll contact you directly if results are abnormal or you don't use MyChart. Most normal results will be on MyChart within 2-3 business days, with a review message from Dr. Jesus. Haven't heard back in 2 weeks? Need results sooner? Contact us  at [phone]. [x]   Referrals: Our referral coordinator will manage specialist referrals. The specialist's office  should contact you within 2 weeks to schedule an appointment. Call us  if you haven't heard from them after 2 weeks.  Staying Connected [x]   MyChart: Activate your MyChart for the fastest way to access results and message us . See the last page of this paperwork for instructions on how to activate.  Bring to Your Next Appointment [x]   Medications: Please bring all your medication bottles to your next appointment to ensure we have an accurate record of your prescriptions. [x]   Health Diaries: If you're monitoring any health conditions at home, keeping a diary of your readings can be very helpful for discussions at your next appointment.  Billing [x]   X-ray & Lab Orders: These are billed by separate companies. Contact the invoicing company directly for questions or concerns. [x]   Visit Charges: Discuss any billing inquiries with our administrative services team.  Your Satisfaction Matters [x]   Share Your Experience: We strive for your satisfaction! If you have any complaints, or preferably compliments, please let Dr. Jesus know directly or contact our Practice Administrators, Manuelita Rubin or [employer], by asking at the front desk.   Reviewing Your Records [x]   Review this early draft of your clinical encounter notes below and the final encounter summary tomorrow on MyChart after its been completed.  All orders placed so far are visible here: Anxiety and depression -     Ambulatory referral to Psychiatry -     Ambulatory referral to Psychology  Alcohol use disorder -     Gabapentin ; Take 1 capsule (400 mg total) by mouth 3 (three) times daily.  Dispense: 270 capsule; Refill: 1 -     Acamprosate  Calcium ; Take 2 tablets (666 mg total) by mouth 3 (three) times daily with meals.  Restless legs -     Gabapentin ; Take 1 capsule (400 mg total) by mouth 3 (three) times daily.  Dispense: 270 capsule; Refill: 1  Other orders -     Sertraline  HCl; Take 1 tablet (50 mg total) by mouth daily.  Start at half tablet for first week  Dispense: 30 tablet; Refill: 3

## 2024-03-01 NOTE — Progress Notes (Signed)
 ==============================  [facility]: [phone]   -- Medical Office Visit --  Patient: John Maxwell      Age: 43 y.o.       Sex:  male  Date:   03/01/2024 Today's Healthcare Provider: Bernardino KANDICE Cone, MD  ==============================   Chief Complaint: Medication Problem (Would like to review over medication what to take and not to take/) and mental issues (Wants to discuss some concerns with provider )   Discussed the use of AI scribe software for clinical note transcription with the patient, who gave verbal consent to proceed.  History of Present Illness 43 year old male with anxiety and alcohol use disorder who presents with worsening anxiety and cravings.  He has been experiencing worsening anxiety, progressively intensifying since his last visit. He feels scared in various situations, such as being in the clinic and while driving. He also reports a significant decrease in motivation, struggling to start and maintain activities he previously enjoyed, like playing drums.  He has a history of alcohol use disorder and has been managing cravings with medication in the past. He stopped taking gabapentin , which was used for cravings management, and is currently only taking trazodone . Although he has not experienced withdrawal symptoms from stopping gabapentin , he acknowledges increased cravings since discontinuation. He remains sober from alcohol.  He reports having thoughts of self-harm, described as 'what if' scenarios, but denies having a specific plan. He has a family history of suicide, with his father having committed suicide about ten years ago, which he finds himself ruminating about frequently.  He is married and states that things are fine at home. 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 Currently, he is taking trazodone  to aid with sleep but reports that it only helps him fall asleep and he  wakes up after two hours due to anxiety. He is not taking any other medications for his anxiety or cravings at this time.  Background Reviewed: Problem List: has Alcohol use disorder; Alcoholic cirrhosis of liver (HCC); Anemia; Folate deficiency; Portal hypertension (HCC); Restless legs; Undifferentiated inflammatory arthritis; Erectile dysfunction; Other fatigue; Insomnia; ANA positive; Low testosterone  in male; Reactive gastropathy; Rash; Night sweats; Vitamin D  deficiency; Hepatic osteodystrophy; and Autoimmune disorder (HCC) on their problem list. 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 Past Surgical History:   has a past surgical history that includes Appendectomy; IR Paracentesis (09/03/2022); Esophagogastroduodenoscopy (egd) with propofol  (N/A, 11/23/2022); and biopsy (11/23/2022). Social History:   reports that he has quit smoking. His smoking use included cigarettes. He has never used smokeless tobacco. He reports current alcohol use. He reports that he does not use drugs. Family History:  family history includes Depression in his father; Diabetes in his father; Heart attack in his father; Heart disease in his father; Mental illness in his father. Allergies:  is allergic to cyclobenzaprine and menthol (topical analgesic).    Medication Reconciliation: Current Outpatient Medications on File Prior to Visit  Medication Sig   celecoxib  (CELEBREX ) 100 MG capsule TAKE  1 CAPSULE(100 MG) BY MOUTH TWICE DAILY   folic acid  (FOLVITE ) 1 MG tablet Take 1 tablet (1 mg total) by mouth daily.   sildenafil  (VIAGRA ) 100 MG tablet TAKE 1/2 TO 1 TABLET(50 TO 100 MG) BY MOUTH DAILY AS NEEDED FOR ERECTILE DYSFUNCTION   tadalafil  (CIALIS ) 20 MG tablet Take 0.5-1 tablets (10-20 mg total) by mouth every other day as needed for erectile dysfunction.   thiamine  (VITAMIN B-1) 100 MG tablet Take 1 tablet (100 mg total) by mouth daily.   traZODone  (DESYREL ) 50 MG tablet TAKE 1 TABLET(50 MG) BY MOUTH AT BEDTIME   Vitamin D , Ergocalciferol , (DRISDOL ) 1.25 MG (50000 UNIT) CAPS capsule Take 1 capsule (50,000 Units total) by mouth every 7 (seven) days.   No current facility-administered medications on file prior to visit.   Medications Discontinued During This Encounter  Medication Reason   acamprosate  (CAMPRAL ) 333 MG tablet Reorder   gabapentin  (NEURONTIN ) 400 MG capsule Reorder     Physical Exam:    03/01/2024    9:57 AM 10/20/2023   11:03 AM 05/30/2023    9:57 AM  Vitals with BMI  Height 5' 7 5' 7 5' 7  Weight 158 lbs 6 oz 144 lbs 151 lbs 13 oz  BMI 24.8 22.55 23.77  Systolic 136 122 885  Diastolic 70 70 64  Pulse 93 85 80  Vital signs reviewed.  Nursing notes reviewed. Weight trend reviewed. Physical Activity: Sufficiently Active (11/19/2023)   Exercise Vital Sign    Days of Exercise per Week: 5 days    Minutes of Exercise per Session: 150+ min   General Appearance:  No acute distress appreciable.   Well-groomed, healthy-appearing male.  Well proportioned with no abnormal fat distribution.  Good muscle tone. Pulmonary:  Normal work of breathing at rest, no respiratory distress apparent. SpO2: 99 %  Musculoskeletal: All extremities are intact.  Neurological:  Awake, alert, oriented, and engaged.  No obvious focal  neurological deficits or cognitive impairments.  Sensorium seems unclouded.   Speech is clear and coherent with logical content. Psychiatric:  Appropriate mood, pleasant and cooperative demeanor, thoughtful and engaged during the exam     03/01/2024   10:13 AM 05/30/2023   10:03 AM 04/13/2023    8:09 AM 11/24/2022   10:22 AM  PHQ 2/9 Scores  PHQ - 2 Score 5 3 4 2   PHQ- 9 Score 22 12 14 10    Lab on 11/23/2023  Component Date Value Ref Range Status   INR 11/23/2023 1.0  0.8 - 1.0 ratio Final   Prothrombin Time 11/23/2023 10.6  9.6 - 13.1 sec Final   WBC 11/23/2023 3.9 (L)  4.0 - 10.5 K/uL Final   RBC 11/23/2023 4.01 (L)  4.22 - 5.81 Mil/uL Final   Hemoglobin 11/23/2023 12.3 (L)  13.0 - 17.0 g/dL Final   HCT 94/71/7974 37.0 (L)  39.0 - 52.0 % Final   MCV 11/23/2023 92.2  78.0 - 100.0 fl Final   MCHC 11/23/2023 33.1  30.0 - 36.0 g/dL Final   RDW 94/71/7974 13.8  11.5 - 15.5 % Final   Platelets 11/23/2023 150.0  150.0 - 400.0 K/uL Final   Neutrophils Relative % 11/23/2023 42.9 (L)  43.0 - 77.0 % Final   Lymphocytes Relative 11/23/2023 46.6 (H)  12.0 - 46.0 % Final   Monocytes Relative 11/23/2023 8.9  3.0 - 12.0 % Final   Eosinophils Relative 11/23/2023 1.0  0.0 - 5.0 % Final   Basophils Relative 11/23/2023 0.6  0.0 -  3.0 % Final   Neutro Abs 11/23/2023 1.7  1.4 - 7.7 K/uL Final   Lymphs Abs 11/23/2023 1.8  0.7 - 4.0 K/uL Final   Monocytes Absolute 11/23/2023 0.3  0.1 - 1.0 K/uL Final   Eosinophils Absolute 11/23/2023 0.0  0.0 - 0.7 K/uL Final   Basophils Absolute 11/23/2023 0.0  0.0 - 0.1 K/uL Final   Sodium 11/23/2023 139  135 - 145 mEq/L Final   Potassium 11/23/2023 4.0  3.5 - 5.1 mEq/L Final   Chloride 11/23/2023 101  96 - 112 mEq/L Final   CO2 11/23/2023 32  19 - 32 mEq/L Final   Glucose, Bld 11/23/2023 100 (H)  70 - 99 mg/dL Final   BUN 94/71/7974 16  6 - 23 mg/dL Final   Creatinine, Ser 11/23/2023 0.67  0.40 - 1.50 mg/dL Final   Total Bilirubin 11/23/2023 0.4  0.2 - 1.2 mg/dL  Final   Alkaline Phosphatase 11/23/2023 84  39 - 117 U/L Final   AST 11/23/2023 25  0 - 37 U/L Final   ALT 11/23/2023 17  0 - 53 U/L Final   Total Protein 11/23/2023 7.0  6.0 - 8.3 g/dL Final   Albumin 94/71/7974 4.2  3.5 - 5.2 g/dL Final   GFR 94/71/7974 116.78  >60.00 mL/min Final   Calcium  11/23/2023 8.8  8.4 - 10.5 mg/dL Final   Fibrinogen  11/23/2023 287  175 - 425 mg/dL Final  Office Visit on 04/13/2023  Component Date Value Ref Range Status   Cholesterol 04/13/2023 184  0 - 200 mg/dL Final   Triglycerides 89/83/7975 73.0  0.0 - 149.0 mg/dL Final   HDL 89/83/7975 73.20  >39.00 mg/dL Final   VLDL 89/83/7975 14.6  0.0 - 40.0 mg/dL Final   LDL Cholesterol 04/13/2023 96  0 - 99 mg/dL Final   Total CHOL/HDL Ratio 04/13/2023 3   Final   NonHDL 04/13/2023 110.79   Final   Sodium 04/13/2023 137  135 - 145 mEq/L Final   Potassium 04/13/2023 4.0  3.5 - 5.1 mEq/L Final   Chloride 04/13/2023 102  96 - 112 mEq/L Final   CO2 04/13/2023 29  19 - 32 mEq/L Final   Glucose, Bld 04/13/2023 99  70 - 99 mg/dL Final   BUN 89/83/7975 11  6 - 23 mg/dL Final   Creatinine, Ser 04/13/2023 0.65  0.40 - 1.50 mg/dL Final   Total Bilirubin 04/13/2023 0.6  0.2 - 1.2 mg/dL Final   Alkaline Phosphatase 04/13/2023 136 (H)  39 - 117 U/L Final   AST 04/13/2023 23  0 - 37 U/L Final   ALT 04/13/2023 18  0 - 53 U/L Final   Total Protein 04/13/2023 7.0  6.0 - 8.3 g/dL Final   Albumin 89/83/7975 4.2  3.5 - 5.2 g/dL Final   GFR 89/83/7975 118.37  >60.00 mL/min Final   Calcium  04/13/2023 9.3  8.4 - 10.5 mg/dL Final   WBC 89/83/7975 5.3  4.0 - 10.5 K/uL Final   RBC 04/13/2023 4.18 (L)  4.22 - 5.81 Mil/uL Final   Hemoglobin 04/13/2023 12.7 (L)  13.0 - 17.0 g/dL Final   HCT 89/83/7975 38.8 (L)  39.0 - 52.0 % Final   MCV 04/13/2023 92.6  78.0 - 100.0 fl Final   MCHC 04/13/2023 32.9  30.0 - 36.0 g/dL Final   RDW 89/83/7975 13.6  11.5 - 15.5 % Final   Platelets 04/13/2023 181.0  150.0 - 400.0 K/uL Final   Neutrophils  Relative % 04/13/2023 61.8  43.0 - 77.0 %  Final   Lymphocytes Relative 04/13/2023 30.3  12.0 - 46.0 % Final   Monocytes Relative 04/13/2023 7.0  3.0 - 12.0 % Final   Eosinophils Relative 04/13/2023 0.5  0.0 - 5.0 % Final   Basophils Relative 04/13/2023 0.4  0.0 - 3.0 % Final   Neutro Abs 04/13/2023 3.3  1.4 - 7.7 K/uL Final   Lymphs Abs 04/13/2023 1.6  0.7 - 4.0 K/uL Final   Monocytes Absolute 04/13/2023 0.4  0.1 - 1.0 K/uL Final   Eosinophils Absolute 04/13/2023 0.0  0.0 - 0.7 K/uL Final   Basophils Absolute 04/13/2023 0.0  0.0 - 0.1 K/uL Final   TSH 04/13/2023 1.330  0.450 - 4.500 uIU/mL Final   Cholesterol 04/13/2023 185  <200 mg/dL Final   HDL 89/83/7975 74  > OR = 40 mg/dL Final   Triglycerides 89/83/7975 72  <150 mg/dL Final   LDL Cholesterol (Calc) 04/13/2023 95  mg/dL (calc) Final   Total CHOL/HDL Ratio 04/13/2023 2.5  <4.9 (calc) Final   Non-HDL Cholesterol (Calc) 04/13/2023 111  <130 mg/dL (calc) Final   VITD 89/83/7975 15.41 (L)  30.00 - 100.00 ng/mL Final   Anti Nuclear Antibody (ANA) 04/13/2023 Positive (A)  Negative Final   dsDNA Ab 04/13/2023 11 (H)  0 - 9 IU/mL Final   ENA RNP Ab 04/13/2023 0.2  0.0 - 0.9 AI Final   ENA SM Ab Ser-aCnc 04/13/2023 <0.2  0.0 - 0.9 AI Final   Scleroderma (Scl-70) (ENA) Antibod* 04/13/2023 <0.2  0.0 - 0.9 AI Final   ENA SSA (RO) Ab 04/13/2023 <0.2  0.0 - 0.9 AI Final   ENA SSB (LA) Ab 04/13/2023 <0.2  0.0 - 0.9 AI Final   Chromatin Ab SerPl-aCnc 04/13/2023 <0.2  0.0 - 0.9 AI Final   Anti JO-1 04/13/2023 <0.2  0.0 - 0.9 AI Final   Centromere Ab Screen 04/13/2023 <0.2  0.0 - 0.9 AI Final   See below: 04/13/2023 Comment   Final   Sed Rate 04/13/2023 19 (H)  0 - 15 mm/hr Final   CRP 04/13/2023 <1.0  0.5 - 20.0 mg/dL Final   Iron 89/83/7975 103  42 - 165 ug/dL Final   Transferrin 89/83/7975 222.0  212.0 - 360.0 mg/dL Final   Saturation Ratios 04/13/2023 33.1  20.0 - 50.0 % Final   Ferritin 04/13/2023 52.4  22.0 - 322.0 ng/mL Final   TIBC  04/13/2023 310.8  250.0 - 450.0 mcg/dL Final   Testosterone  04/13/2023 328.34  300.00 - 890.00 ng/dL Final  Office Visit on 12/14/2022  Component Date Value Ref Range Status   WBC 12/14/2022 3.6 (L)  4.0 - 10.5 K/uL Final   RBC 12/14/2022 4.41  4.22 - 5.81 Mil/uL Final   Hemoglobin 12/14/2022 13.3  13.0 - 17.0 g/dL Final   HCT 93/81/7975 39.7  39.0 - 52.0 % Final   MCV 12/14/2022 90.1  78.0 - 100.0 fl Final   MCHC 12/14/2022 33.4  30.0 - 36.0 g/dL Final   RDW 93/81/7975 13.5  11.5 - 15.5 % Final   Platelets 12/14/2022 170.0  150.0 - 400.0 K/uL Final   Neutrophils Relative % 12/14/2022 52.3  43.0 - 77.0 % Final   Lymphocytes Relative 12/14/2022 33.7  12.0 - 46.0 % Final   Monocytes Relative 12/14/2022 11.3  3.0 - 12.0 % Final   Eosinophils Relative 12/14/2022 2.2  0.0 - 5.0 % Final   Basophils Relative 12/14/2022 0.5  0.0 - 3.0 % Final   Neutro Abs 12/14/2022 1.9  1.4 -  7.7 K/uL Final   Lymphs Abs 12/14/2022 1.2  0.7 - 4.0 K/uL Final   Monocytes Absolute 12/14/2022 0.4  0.1 - 1.0 K/uL Final   Eosinophils Absolute 12/14/2022 0.1  0.0 - 0.7 K/uL Final   Basophils Absolute 12/14/2022 0.0  0.0 - 0.1 K/uL Final   Sodium 12/14/2022 137  135 - 145 mEq/L Final   Potassium 12/14/2022 4.3  3.5 - 5.1 mEq/L Final   Chloride 12/14/2022 101  96 - 112 mEq/L Final   CO2 12/14/2022 29  19 - 32 mEq/L Final   Glucose, Bld 12/14/2022 93  70 - 99 mg/dL Final   BUN 93/81/7975 13  6 - 23 mg/dL Final   Creatinine, Ser 12/14/2022 0.61  0.40 - 1.50 mg/dL Final   Total Bilirubin 12/14/2022 0.4  0.2 - 1.2 mg/dL Final   Alkaline Phosphatase 12/14/2022 120 (H)  39 - 117 U/L Final   AST 12/14/2022 32  0 - 37 U/L Final   ALT 12/14/2022 22  0 - 53 U/L Final   Total Protein 12/14/2022 7.7  6.0 - 8.3 g/dL Final   Albumin 93/81/7975 4.0  3.5 - 5.2 g/dL Final   GFR 93/81/7975 120.94  >60.00 mL/min Final   Calcium  12/14/2022 9.4  8.4 - 10.5 mg/dL Final   AFP-Tumor Marker 12/14/2022 2.5  <6.1 ng/mL Final   Hep B S Ab  12/14/2022 REACTIVE (A)  NON-REACTIVE Final   Hepatitis A AB,Total 12/14/2022 REACTIVE (A)  NON-REACTIVE Final   Sed Rate 12/14/2022 40 (H)  0 - 15 mm/hr Final   CRP 12/14/2022 <1.0  0.5 - 20.0 mg/dL Final  Admission on 94/71/7975, Discharged on 11/23/2022  Component Date Value Ref Range Status   SURGICAL PATHOLOGY 11/23/2022    Final-Edited                   Value:SURGICAL PATHOLOGY CASE: [id number] PATIENT: Knox Stager Surgical Pathology Report     Clinical History: 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 STOMACH, BIOPSY:      Gastric antral mucosa with reactive gastropathy.      Gastric oxyntic mucosa with no significant diagnostic alteration.      No H. pylori identified on HE.      Negative for intestinal metaplasia or dysplasia.   GROSS DESCRIPTION:  Received in formalin are tan, soft tissue fragments that are submitted in toto. Number: 5 size: 0.2-0.4 cm blocks: 1 (GRP 11/15/2022)   Final Diagnosis performed by Pepper Dutton, MD.   Electronically signed 11/24/2022 Technical component performed at [facility], [address] [place], [place] [address].  Professional component performed at [employer], [address] [place], [place] [address].  Immunohistochemistry Technical component (if applicable) was performed                          at [facility]. [address], [place], [place] [address].   IMMUNOHISTOCHEMISTRY DISCLAIMER (if applicable): Some of these immunohistochemical stains may have been developed and the performance characteristics determine by [facility]. Some may not have been cleared or approved by the U.S. Food and Drug Administration. The FDA has determined that such clearance or approval is not necessary. This test is used for clinical purposes. It should not be regarded as investigational or for research.  This laboratory is certified under the Clinical Laboratory Improvement Amendments of 1988 (  CLIA-88) as qualified to perform high complexity clinical laboratory testing.  The controls stained appropriately.   Office Visit on 11/02/2022  Component Date Value Ref Range Status   Rheumatoid fact SerPl-aCnc 11/02/2022 <10  <14 IU/mL Final   Cyclic Citrullin Peptide Ab 94/92/7975 <16  UNITS Final   Testosterone  11/02/2022 362.20  300.00 - 890.00 ng/dL Final   VITD 94/92/7975 12.47 (L)  30.00 - 100.00 ng/mL Final   Sed Rate 11/02/2022 29 (H)  0 - 15 mm/hr Final   CRP 11/02/2022 <1.0  0.5 - 20.0 mg/dL Final   Anti Nuclear Antibody (ANA) 11/02/2022 POSITIVE (A)  NEGATIVE Final   Total CK 11/02/2022 119  7 - 232 U/L Final   Sodium 11/02/2022 137  135 - 145 mEq/L Final   Potassium 11/02/2022 4.1  3.5 - 5.1 mEq/L Final   Chloride 11/02/2022 101  96 - 112 mEq/L Final   CO2 11/02/2022 28  19 - 32 mEq/L Final   Glucose, Bld 11/02/2022 97  70 - 99 mg/dL Final   BUN 94/92/7975 10  6 - 23 mg/dL Final   Creatinine, Ser 11/02/2022 0.61  0.40 - 1.50 mg/dL Final   Total Bilirubin 11/02/2022 0.9  0.2 - 1.2 mg/dL Final   Alkaline Phosphatase 11/02/2022 105  39 - 117 U/L Final   AST 11/02/2022 37  0 - 37 U/L Final   ALT 11/02/2022 20  0 - 53 U/L Final   Total Protein 11/02/2022 6.8  6.0 - 8.3 g/dL Final   Albumin 94/92/7975 3.6  3.5 - 5.2 g/dL Final   GFR 94/92/7975 121.04  >60.00 mL/min Final   Calcium  11/02/2022 9.3  8.4 - 10.5 mg/dL Final   Testosterone  11/02/2022 649  264 - 916 ng/dL Final   Testosterone , Free 11/02/2022 2.1 (L)  8.7 - 25.1 pg/mL Final   Sex Hormone Binding 11/02/2022 139.0 (H)  16.5 - 55.9 nmol/L Final   WBC 11/02/2022 5.2  4.0 - 10.5 K/uL Final   RBC 11/02/2022 3.91 (L)  4.22 - 5.81 Mil/uL Final   Hemoglobin 11/02/2022 12.3 (L)  13.0 - 17.0 g/dL Final   HCT 94/92/7975 37.4 (L)  39.0 - 52.0 % Final   MCV 11/02/2022 95.5  78.0 - 100.0 fl Final   MCHC 11/02/2022 33.0  30.0 - 36.0 g/dL  Final   RDW 94/92/7975 15.5  11.5 - 15.5 % Final   Platelets 11/02/2022 221.0  150.0 - 400.0 K/uL Final   Neutrophils Relative % 11/02/2022 46.5  43.0 - 77.0 % Final   Lymphocytes Relative 11/02/2022 36.5  12.0 - 46.0 % Final   Monocytes Relative 11/02/2022 14.3 (H)  3.0 - 12.0 % Final   Eosinophils Relative 11/02/2022 1.9  0.0 - 5.0 % Final   Basophils Relative 11/02/2022 0.8  0.0 - 3.0 % Final   Neutro Abs 11/02/2022 2.4  1.4 - 7.7 K/uL Final   Lymphs Abs 11/02/2022 1.9  0.7 - 4.0 K/uL Final   Monocytes Absolute 11/02/2022 0.7  0.1 - 1.0 K/uL Final   Eosinophils Absolute 11/02/2022 0.1  0.0 - 0.7 K/uL Final   Basophils Absolute 11/02/2022 0.0  0.0 - 0.1 K/uL Final   ANA Titer 1 11/02/2022 1:40 (H)  titer Final   ANA Pattern 1 11/02/2022 Mitotic, Spindle Fibers (A)   Final   ANA TITER 11/02/2022 1:40 (H)  titer Final   ANA PATTERN 11/02/2022 Cytoplasmic (A)   Final   ANA TITER 11/02/2022 1:40 (H)  titer Final   ANA PATTERN 11/02/2022  Nuclear, Fine Speckled (A)   Final  Scanned Document on 09/07/2022  Component Date Value Ref Range Status   EGFR 09/02/2022 123.0   Final   INR 09/02/2022 1.10  0.80 - 1.20 Final  Hospital Outpatient Visit on 09/03/2022  Component Date Value Ref Range Status   Fluid Type-FCT 09/03/2022 PERITONEAL   Corrected   Color, Fluid 09/03/2022 YELLOW  YELLOW Final   Appearance, Fluid 09/03/2022 HAZY (A)  CLEAR Final   Total Nucleated Cell Count, Fluid 09/03/2022 210  0 - 1,000 cu mm Final   Neutrophil Count, Fluid 09/03/2022 7  0 - 25 % Final   Lymphs, Fluid 09/03/2022 32  % Final   Monocyte-Macrophage-Serous Fluid 09/03/2022 61  50 - 90 % Final   Eos, Fluid 09/03/2022 0  % Final   Other Cells, Fluid 09/03/2022 OTHER CELLS IDENTIFIED AS MESOTHELIAL CELLS  % Final   Albumin, Fluid 09/03/2022 <1.5  g/dL Final   Fluid Type-FALB 09/03/2022 PERITONEAL   Corrected   Path Review 09/03/2022 NEGATIVE FOR MALIGNANCY. BENIGN MESOTHELIAL CELLS AND CHRONIC INFLAMMATORY  CELLS   Final  Admission on 08/21/2022, Discharged on 08/25/2022  Component Date Value Ref Range Status   Sodium 08/21/2022 127 (L)  135 - 145 mmol/L Final   Potassium 08/21/2022 3.1 (L)  3.5 - 5.1 mmol/L Final   Chloride 08/21/2022 87 (L)  98 - 111 mmol/L Final   CO2 08/21/2022 30  22 - 32 mmol/L Final   Glucose, Bld 08/21/2022 100 (H)  70 - 99 mg/dL Final   BUN 97/75/7975 9  6 - 20 mg/dL Final   Creatinine, Ser 08/21/2022 0.53 (L)  0.61 - 1.24 mg/dL Final   Calcium  08/21/2022 7.9 (L)  8.9 - 10.3 mg/dL Final   Total Protein 97/75/7975 6.7  6.5 - 8.1 g/dL Final   Albumin 97/75/7975 2.5 (L)  3.5 - 5.0 g/dL Final   AST 97/75/7975 257 (H)  15 - 41 U/L Final   ALT 08/21/2022 73 (H)  0 - 44 U/L Final   Alkaline Phosphatase 08/21/2022 232 (H)  38 - 126 U/L Final   Total Bilirubin 08/21/2022 7.5 (H)  0.3 - 1.2 mg/dL Final   GFR, Estimated 08/21/2022 >60  >60 mL/min Final   Anion gap 08/21/2022 10  5 - 15 Final   Alcohol, Ethyl (B) 08/21/2022 <10  <10 mg/dL Final   WBC 97/75/7975 13.3 (H)  4.0 - 10.5 K/uL Final   RBC 08/21/2022 3.00 (L)  4.22 - 5.81 MIL/uL Final   Hemoglobin 08/21/2022 12.0 (L)  13.0 - 17.0 g/dL Final   HCT 97/75/7975 32.5 (L)  39.0 - 52.0 % Final   MCV 08/21/2022 108.3 (H)  80.0 - 100.0 fL Final   MCH 08/21/2022 40.0 (H)  26.0 - 34.0 pg Final   MCHC 08/21/2022 36.9 (H)  30.0 - 36.0 g/dL Final   RDW 97/75/7975 15.3  11.5 - 15.5 % Final   Platelets 08/21/2022 123 (L)  150 - 400 K/uL Final   nRBC 08/21/2022 0.0  0.0 - 0.2 % Final   Opiates 08/21/2022 NONE DETECTED  NONE DETECTED Final   Cocaine 08/21/2022 NONE DETECTED  NONE DETECTED Final   Benzodiazepines 08/21/2022 NONE DETECTED  NONE DETECTED Final   Amphetamines 08/21/2022 NONE DETECTED  NONE DETECTED Final   Tetrahydrocannabinol 08/21/2022 NONE DETECTED  NONE DETECTED Final   Barbiturates 08/21/2022 NONE DETECTED  NONE DETECTED Final   Lipase 08/21/2022 39  11 - 51 U/L Final   Prothrombin Time 08/21/2022 16.1 (  H)   11.4 - 15.2 seconds Final   INR 08/21/2022 1.3 (H)  0.8 - 1.2 Final   Ammonia 08/21/2022 28  9 - 35 umol/L Final   Sodium, Ur 08/21/2022 <10  mmol/L Final   Chloride Urine 08/21/2022 19  mmol/L Final   HIV Screen 4th Generation wRfx 08/21/2022 Non Reactive  Non Reactive Final   Osmolality 08/21/2022 283  275 - 295 mOsm/kg Final   Vitamin B-12 08/21/2022 452  180 - 914 pg/mL Final   Folate 08/21/2022 1.6 (L)  >5.9 ng/mL Final   Osmolality, Ur 08/21/2022 776  300 - 900 mOsm/kg Final   WBC 08/22/2022 9.4  4.0 - 10.5 K/uL Final   RBC 08/22/2022 2.95 (L)  4.22 - 5.81 MIL/uL Final   Hemoglobin 08/22/2022 11.6 (L)  13.0 - 17.0 g/dL Final   HCT 97/74/7975 32.9 (L)  39.0 - 52.0 % Final   MCV 08/22/2022 111.5 (H)  80.0 - 100.0 fL Final   MCH 08/22/2022 39.3 (H)  26.0 - 34.0 pg Final   MCHC 08/22/2022 35.3  30.0 - 36.0 g/dL Final   RDW 97/74/7975 15.9 (H)  11.5 - 15.5 % Final   Platelets 08/22/2022 105 (L)  150 - 400 K/uL Final   nRBC 08/22/2022 0.2  0.0 - 0.2 % Final   Sodium 08/22/2022 126 (L)  135 - 145 mmol/L Final   Potassium 08/22/2022 3.1 (L)  3.5 - 5.1 mmol/L Final   Chloride 08/22/2022 89 (L)  98 - 111 mmol/L Final   CO2 08/22/2022 31  22 - 32 mmol/L Final   Glucose, Bld 08/22/2022 104 (H)  70 - 99 mg/dL Final   BUN 97/74/7975 12  6 - 20 mg/dL Final   Creatinine, Ser 08/22/2022 0.61  0.61 - 1.24 mg/dL Final   Calcium  08/22/2022 7.7 (L)  8.9 - 10.3 mg/dL Final   Total Protein 97/74/7975 6.4 (L)  6.5 - 8.1 g/dL Final   Albumin 97/74/7975 2.5 (L)  3.5 - 5.0 g/dL Final   AST 97/74/7975 186 (H)  15 - 41 U/L Final   ALT 08/22/2022 60 (H)  0 - 44 U/L Final   Alkaline Phosphatase 08/22/2022 208 (H)  38 - 126 U/L Final   Total Bilirubin 08/22/2022 6.3 (H)  0.3 - 1.2 mg/dL Final   GFR, Estimated 08/22/2022 >60  >60 mL/min Final   Anion gap 08/22/2022 6  5 - 15 Final   Hepatitis B Surface Ag 08/23/2022 NON REACTIVE  NON REACTIVE Final   HCV Ab 08/23/2022 NON REACTIVE  NON REACTIVE Final    Hep A IgM 08/23/2022 NON REACTIVE  NON REACTIVE Final   Hep B C IgM 08/23/2022 NON REACTIVE  NON REACTIVE Final   Ammonia 08/23/2022 53 (H)  9 - 35 umol/L Final   Sodium 08/23/2022 130 (L)  135 - 145 mmol/L Final   Potassium 08/23/2022 3.4 (L)  3.5 - 5.1 mmol/L Final   Chloride 08/23/2022 91 (L)  98 - 111 mmol/L Final   CO2 08/23/2022 32  22 - 32 mmol/L Final   Glucose, Bld 08/23/2022 111 (H)  70 - 99 mg/dL Final   BUN 97/73/7975 10  6 - 20 mg/dL Final   Creatinine, Ser 08/23/2022 0.59 (L)  0.61 - 1.24 mg/dL Final   Calcium  08/23/2022 8.0 (L)  8.9 - 10.3 mg/dL Final   Total Protein 97/73/7975 6.1 (L)  6.5 - 8.1 g/dL Final   Albumin 97/73/7975 2.4 (L)  3.5 - 5.0 g/dL Final  AST 08/23/2022 155 (H)  15 - 41 U/L Final   ALT 08/23/2022 54 (H)  0 - 44 U/L Final   Alkaline Phosphatase 08/23/2022 189 (H)  38 - 126 U/L Final   Total Bilirubin 08/23/2022 4.3 (H)  0.3 - 1.2 mg/dL Final   GFR, Estimated 08/23/2022 >60  >60 mL/min Final   Anion gap 08/23/2022 7  5 - 15 Final   Prothrombin Time 08/23/2022 16.0 (H)  11.4 - 15.2 seconds Final   INR 08/23/2022 1.3 (H)  0.8 - 1.2 Final   WBC 08/23/2022 8.4  4.0 - 10.5 K/uL Final   RBC 08/23/2022 2.78 (L)  4.22 - 5.81 MIL/uL Final   Hemoglobin 08/23/2022 11.1 (L)  13.0 - 17.0 g/dL Final   HCT 97/73/7975 31.9 (L)  39.0 - 52.0 % Final   MCV 08/23/2022 114.7 (H)  80.0 - 100.0 fL Final   MCH 08/23/2022 39.9 (H)  26.0 - 34.0 pg Final   MCHC 08/23/2022 34.8  30.0 - 36.0 g/dL Final   RDW 97/73/7975 16.0 (H)  11.5 - 15.5 % Final   Platelets 08/23/2022 113 (L)  150 - 400 K/uL Final   nRBC 08/23/2022 0.0  0.0 - 0.2 % Final   CYTOLOGY - NON GYN 08/23/2022    Final-Edited                   Value:CYTOLOGY - NON PAP CASE: [id number] PATIENT: Jorge Train Non-Gynecological Cytology Report     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 ASCITES, PARACENTESIS:   FINAL MICROSCOPIC DIAGNOSIS: - No malignant cells  identified - Numerous benign mesothelial cells with scattered lymphocytes within a serous background  SPECIMEN ADEQUACY: Satisfactory for evaluation  GROSS: Received is/are 1000 ccs of yellow fluid. (CM:cm) Prepared: Smears:  0 Concentration Method (Thin Prep):  1 Cell Block:  1 Additional Studies:  n/a     Final Diagnosis performed by Prentice Pitcher, MD.   Electronically signed 08/25/2022 Technical component performed at [facility], [address] [place], [place] [address].  Professional component performed at [employer]. [facility] [address] [place], [place] [address].  Immunohistochemistry Technical component (if applicable) was performed at [facility]. [address], [place], [place] [address].   IMMUNOHISTOCHEMISTRY DISCLAIMER (if applicable): Some of these immunohistochemical stains may have been developed and the performance characteristics determine by [facility]. Some may not have been cleared or approved by the U.S. Food and Drug Administration. The FDA has determined that such clearance or approval is not necessary. This test is used for clinical purposes. It should not be regarded as investigational or for research. This laboratory is certified under the Clinical Laboratory Improvement Amendments of 1988 (CLIA-88) as qualified to perform high complexity clinical laboratory testing.  The controls stained appropriately.    Fluid Type-FCT 08/23/2022 CYTO PERI   Final   Color, Fluid 08/23/2022 YELLOW (A)  YELLOW Final   Appearance, Fluid 08/23/2022 CLEAR  CLEAR Final   Total Nucleated Cell Count, Fluid 08/23/2022 173  0 - 1,000 cu mm Final   Neutrophil Count, Fluid 08/23/2022 7  0 - 25 % Final   Lymphs, Fluid 08/23/2022 7  % Final   Monocyte-Macrophage-Serous Fluid 08/23/2022 86  50 - 90 % Final   Eos, Fluid 08/23/2022  0  % Final   Albumin, Fluid 08/23/2022  <1.5  g/dL Final   Fluid Type-FALB 08/23/2022 CYTO PERI   Final   WBC 08/24/2022 8.7  4.0 - 10.5 K/uL Final   RBC 08/24/2022 3.09 (L)  4.22 - 5.81 MIL/uL Final   Hemoglobin 08/24/2022 12.7 (L)  13.0 - 17.0 g/dL Final   HCT 97/72/7975 36.3 (L)  39.0 - 52.0 % Final   MCV 08/24/2022 117.5 (H)  80.0 - 100.0 fL Final   MCH 08/24/2022 41.1 (H)  26.0 - 34.0 pg Final   MCHC 08/24/2022 35.0  30.0 - 36.0 g/dL Final   RDW 97/72/7975 16.9 (H)  11.5 - 15.5 % Final   Platelets 08/24/2022 121 (L)  150 - 400 K/uL Final   nRBC 08/24/2022 0.2  0.0 - 0.2 % Final   Sodium 08/24/2022 131 (L)  135 - 145 mmol/L Final   Potassium 08/24/2022 3.6  3.5 - 5.1 mmol/L Final   Chloride 08/24/2022 94 (L)  98 - 111 mmol/L Final   CO2 08/24/2022 31  22 - 32 mmol/L Final   Glucose, Bld 08/24/2022 111 (H)  70 - 99 mg/dL Final   BUN 97/72/7975 9  6 - 20 mg/dL Final   Creatinine, Ser 08/24/2022 0.67  0.61 - 1.24 mg/dL Final   Calcium  08/24/2022 8.0 (L)  8.9 - 10.3 mg/dL Final   Total Protein 97/72/7975 6.5  6.5 - 8.1 g/dL Final   Albumin 97/72/7975 2.6 (L)  3.5 - 5.0 g/dL Final   AST 97/72/7975 139 (H)  15 - 41 U/L Final   ALT 08/24/2022 54 (H)  0 - 44 U/L Final   Alkaline Phosphatase 08/24/2022 187 (H)  38 - 126 U/L Final   Total Bilirubin 08/24/2022 4.5 (H)  0.3 - 1.2 mg/dL Final   GFR, Estimated 08/24/2022 >60  >60 mL/min Final   Anion gap 08/24/2022 6  5 - 15 Final   Prothrombin Time 08/24/2022 15.6 (H)  11.4 - 15.2 seconds Final   INR 08/24/2022 1.3 (H)  0.8 - 1.2 Final  Office Visit on 08/19/2022  Component Date Value Ref Range Status   AFP-Tumor Marker 08/19/2022 4.2  <6.1 ng/mL Final   Cholesterol 08/19/2022 128  0 - 200 mg/dL Final   Triglycerides 97/77/7975 96.0  0.0 - 149.0 mg/dL Final   HDL 97/77/7975 27.70 (L)  >60.99 mg/dL Final   VLDL 97/77/7975 19.2  0.0 - 40.0 mg/dL Final   LDL Cholesterol 08/19/2022 81  0 - 99 mg/dL Final   Total CHOL/HDL Ratio 08/19/2022 5   Final   NonHDL 08/19/2022 99.98    Final   TSH 08/19/2022 4.95  0.35 - 5.50 uIU/mL Final   Hgb A1c MFr Bld 08/19/2022 4.3 (L)  4.6 - 6.5 % Final   Sodium 08/19/2022 134 (L)  135 - 145 mEq/L Final   Potassium 08/19/2022 3.4 (L)  3.5 - 5.1 mEq/L Final   Chloride 08/19/2022 87 (L)  96 - 112 mEq/L Final   CO2 08/19/2022 33 (H)  19 - 32 mEq/L Final   Glucose, Bld 08/19/2022 95  70 - 99 mg/dL Final   BUN 97/77/7975 6  6 - 23 mg/dL Final   Creatinine, Ser 08/19/2022 0.71  0.40 - 1.50 mg/dL Final   Total Bilirubin 08/19/2022 3.6 (H)  0.2 - 1.2 mg/dL Final   Alkaline Phosphatase 08/19/2022 295 (H)  39 - 117 U/L Final   AST 08/19/2022 232 (H)  0 - 37 U/L Final   ALT  08/19/2022 56 (H)  0 - 53 U/L Final   Total Protein 08/19/2022 6.5  6.0 - 8.3 g/dL Final   Albumin 97/77/7975 3.0 (L)  3.5 - 5.2 g/dL Final   GFR 97/77/7975 115.78  >60.00 mL/min Final   Calcium  08/19/2022 8.2 (L)  8.4 - 10.5 mg/dL Final   WBC 97/77/7975 9.3  4.0 - 10.5 K/uL Final   RBC 08/19/2022 3.14 (L)  4.22 - 5.81 Mil/uL Final   Platelets 08/19/2022 150.0  150.0 - 400.0 K/uL Final   Hemoglobin 08/19/2022 12.7 (L)  13.0 - 17.0 g/dL Final   HCT 97/77/7975 36.9 (L)  39.0 - 52.0 % Final   MCV 08/19/2022 117.5 Repeated and verified X2. (H)  78.0 - 100.0 fl Final   MCHC 08/19/2022 34.5  30.0 - 36.0 g/dL Final   RDW 97/77/7975 15.7 (H)  11.5 - 15.5 % Final  Admission on 07/31/2022, Discharged on 07/31/2022  Component Date Value Ref Range Status   WBC 07/31/2022 8.3  4.0 - 10.5 K/uL Final   RBC 07/31/2022 3.43 (L)  4.22 - 5.81 MIL/uL Final   Hemoglobin 07/31/2022 13.3  13.0 - 17.0 g/dL Final   HCT 97/96/7975 36.9 (L)  39.0 - 52.0 % Final   MCV 07/31/2022 107.6 (H)  80.0 - 100.0 fL Final   MCH 07/31/2022 38.8 (H)  26.0 - 34.0 pg Final   MCHC 07/31/2022 36.0  30.0 - 36.0 g/dL Final   RDW 97/96/7975 16.4 (H)  11.5 - 15.5 % Final   Platelets 07/31/2022 128 (L)  150 - 400 K/uL Final   nRBC 07/31/2022 0.0  0.0 - 0.2 % Final   Neutrophils Relative % 07/31/2022 75  %  Final   Neutro Abs 07/31/2022 6.3  1.7 - 7.7 K/uL Final   Lymphocytes Relative 07/31/2022 13  % Final   Lymphs Abs 07/31/2022 1.1  0.7 - 4.0 K/uL Final   Monocytes Relative 07/31/2022 10  % Final   Monocytes Absolute 07/31/2022 0.8  0.1 - 1.0 K/uL Final   Eosinophils Relative 07/31/2022 0  % Final   Eosinophils Absolute 07/31/2022 0.0  0.0 - 0.5 K/uL Final   Basophils Relative 07/31/2022 1  % Final   Basophils Absolute 07/31/2022 0.1  0.0 - 0.1 K/uL Final   Immature Granulocytes 07/31/2022 1  % Final   Abs Immature Granulocytes 07/31/2022 0.05  0.00 - 0.07 K/uL Final   Color, Urine 07/31/2022 AMBER (A)  YELLOW Final   APPearance 07/31/2022 CLEAR  CLEAR Final   Specific Gravity, Urine 07/31/2022 1.020  1.005 - 1.030 Final   pH 07/31/2022 6.0  5.0 - 8.0 Final   Glucose, UA 07/31/2022 NEGATIVE  NEGATIVE mg/dL Final   Hgb urine dipstick 07/31/2022 NEGATIVE  NEGATIVE Final   Bilirubin Urine 07/31/2022 MODERATE (A)  NEGATIVE Final   Ketones, ur 07/31/2022 NEGATIVE  NEGATIVE mg/dL Final   Protein, ur 97/96/7975 30 (A)  NEGATIVE mg/dL Final   Nitrite 97/96/7975 NEGATIVE  NEGATIVE Final   Leukocytes,Ua 07/31/2022 NEGATIVE  NEGATIVE Final   RBC / HPF 07/31/2022 0-5  0 - 5 RBC/hpf Final   WBC, UA 07/31/2022 0-5  0 - 5 WBC/hpf Final   Bacteria, UA 07/31/2022 MANY (A)  NONE SEEN Final   Squamous Epithelial / HPF 07/31/2022 0-5  0 - 5 /HPF Final   Sodium 07/31/2022 132 (L)  135 - 145 mmol/L Final   Potassium 07/31/2022 3.4 (L)  3.5 - 5.1 mmol/L Final   Chloride 07/31/2022 95 (L)  98 -  111 mmol/L Final   CO2 07/31/2022 28  22 - 32 mmol/L Final   Glucose, Bld 07/31/2022 137 (H)  70 - 99 mg/dL Final   BUN 97/96/7975 <5 (L)  6 - 20 mg/dL Final   Creatinine, Ser 07/31/2022 0.59 (L)  0.61 - 1.24 mg/dL Final   Calcium  07/31/2022 7.6 (L)  8.9 - 10.3 mg/dL Final   Total Protein 97/96/7975 6.9  6.5 - 8.1 g/dL Final   Albumin 97/96/7975 2.8 (L)  3.5 - 5.0 g/dL Final   AST 97/96/7975 175 (H)  15 - 41 U/L  Final   ALT 07/31/2022 37  0 - 44 U/L Final   Alkaline Phosphatase 07/31/2022 245 (H)  38 - 126 U/L Final   Total Bilirubin 07/31/2022 2.6 (H)  0.3 - 1.2 mg/dL Final   GFR, Estimated 07/31/2022 >60  >60 mL/min Final   Anion gap 07/31/2022 9  5 - 15 Final   Lipase 07/31/2022 23  11 - 51 U/L Final  There may be more visits with results that are not included.  No image results found. No results found.       ASSESSMENT & PLAN   Assessment & Plan Anxiety and depression Alcohol use disorder Restless legs He reports cravings and anxiety, indicating a risk of relapse. He has stopped taking medications for cravings management, including acamprosate . Although no recent alcohol use is reported, there is a significant risk of relapse due to anxiety and depression. Restart gabapentin  to help with anxiety and cravings. Restart acamprosate  to reduce cravings and risk of relapse. Provide information on [facility] for crisis support. Encourage follow-up every 1-2 weeks to monitor progress and prevent relapse.  Major depressive disorder and anxiety disorder   He is experiencing worsening anxiety and lack of motivation, reporting mixed anxiety and depression disorder. He has stopped taking medications, leading to increased symptoms. There is no current suicidal plan, but he has thoughts and a family history of suicide. Start sertraline  (Zoloft ) as an SSRI to address anxiety and depression. Make urgent referrals to psychiatry and psychology for further support. Discuss the potential need for inpatient care if outpatient management is insufficient. Encourage use of trazodone  for sleep, with gabapentin  at bedtime to aid sleep through the night.  Goals of Care   Discuss the importance of addressing suicidal thoughts and the potential need for inpatient care if outpatient management is insufficient. Provide information on [facility] for crisis support.  Encourage open communication with family and support systems.  ORDER ASSOCIATIONS  #   DIAGNOSIS / CONDITION ICD-10 ENCOUNTER ORDER     ICD-10-CM   1. Anxiety and depression  F41.9 Ambulatory referral to Psychiatry   F32.A Ambulatory referral to Psychology    2. Alcohol use disorder  F10.90 gabapentin  (NEURONTIN ) 400 MG capsule    acamprosate  (CAMPRAL ) 333 MG tablet    3. Restless legs  G25.81 gabapentin  (NEURONTIN ) 400 MG capsule           Orders Placed in Encounter:   Lab Orders  No laboratory test(s) ordered today   Imaging Orders  No imaging studies ordered today   Referral Orders         Ambulatory referral to Psychiatry         Ambulatory referral to Psychology     Meds ordered this encounter  Medications   gabapentin  (NEURONTIN ) 400 MG capsule    Sig: Take 1 capsule (400 mg total) by mouth 3 (three) times daily.  Dispense:  270 capsule    Refill:  1   acamprosate  (CAMPRAL ) 333 MG tablet    Sig: Take 2 tablets (666 mg total) by mouth 3 (three) times daily with meals.   sertraline  (ZOLOFT ) 50 MG tablet    Sig: Take 1 tablet (50 mg total) by mouth daily. Start at half tablet for first week    Dispense:  30 tablet    Refill:  3    Orders Placed This Encounter  Procedures   Ambulatory referral to Psychiatry    Referral Priority:   Routine    Referral Type:   Psychiatric    Referral Reason:   Specialty Services Required    Requested Specialty:   Psychiatry    Number of Visits Requested:   1   Ambulatory referral to Psychology    Referral Priority:   Urgent    Referral Type:   Psychiatric    Referral Reason:   Specialty Services Required    Requested Specialty:   Psychology    Number of Visits Requested:   1        This document was synthesized by artificial intelligence ([employer]bridge) using HIPAA-compliant recording of the clinical interaction;   We discussed the use of AI scribe software for clinical note transcription with the patient, who gave verbal  consent to proceed. additional Info: This encounter employed state-of-the-art, real-time, collaborative documentation. The patient actively reviewed and assisted in updating their electronic medical record on a shared screen, ensuring transparency and facilitating joint problem-solving for the problem list, overview, and plan. This approach promotes accurate, informed care. The treatment plan was discussed and reviewed in detail, including medication safety, potential side effects, and all patient questions. We confirmed understanding and comfort with the plan. Follow-up instructions were established, including contacting the office for any concerns, returning if symptoms worsen, persist, or new symptoms develop, and precautions for potential emergency department visits.

## 2024-03-01 NOTE — Telephone Encounter (Signed)
 Pt is in office provider will go over the medication changes and concerns with pt today

## 2024-03-02 ENCOUNTER — Other Ambulatory Visit: Payer: Self-pay

## 2024-03-02 DIAGNOSIS — F1090 Alcohol use, unspecified, uncomplicated: Secondary | ICD-10-CM

## 2024-03-02 DIAGNOSIS — F419 Anxiety disorder, unspecified: Secondary | ICD-10-CM

## 2024-03-09 ENCOUNTER — Ambulatory Visit: Admitting: Internal Medicine

## 2024-04-02 ENCOUNTER — Telehealth: Payer: Self-pay

## 2024-04-02 DIAGNOSIS — F1090 Alcohol use, unspecified, uncomplicated: Secondary | ICD-10-CM

## 2024-04-02 MED ORDER — GABAPENTIN 400 MG PO CAPS: 400.0000 mg | ORAL_CAPSULE | Freq: Four times a day (QID) | ORAL | 1 refills | Status: DC

## 2024-04-02 NOTE — Addendum Note (Signed)
 Addended by: Essie Gehret G on: 04/02/2024 08:13 PM   Modules accepted: Orders

## 2024-04-02 NOTE — Telephone Encounter (Signed)
 Copied from CRM #[id number]. Topic: Clinical - Prescription Issue >> Apr 02, 2024  1:40 PM Mercedes MATSU wrote: Reason for CRM: Patient called in stating that the pharmacy gave him one bottle of the gabapentin  (NEURONTIN ) 400 MG capsule and normally he gets 2 bottles. Patient is requesting a call back at [phone].  Spoke with pt about medication above stated he takes four times a day one in the morning one mid day two at bedtime. Would like to know when it changed rx was sent for 3 times a day only 270 cap. I advise pt that I would send message to pcp and give him call back

## 2024-04-03 NOTE — Telephone Encounter (Signed)
 Tried to call pt about medication no answer left detail message for pt

## 2024-04-09 ENCOUNTER — Ambulatory Visit (INDEPENDENT_AMBULATORY_CARE_PROVIDER_SITE_OTHER): Admitting: Internal Medicine

## 2024-04-09 ENCOUNTER — Encounter: Payer: Self-pay | Admitting: Internal Medicine

## 2024-04-09 VITALS — BP 136/70 | HR 80 | Temp 99.0°F | Ht 67.0 in | Wt 163.2 lb

## 2024-04-09 DIAGNOSIS — F32A Depression, unspecified: Secondary | ICD-10-CM | POA: Diagnosis not present

## 2024-04-09 DIAGNOSIS — G4700 Insomnia, unspecified: Secondary | ICD-10-CM | POA: Diagnosis not present

## 2024-04-09 DIAGNOSIS — F1090 Alcohol use, unspecified, uncomplicated: Secondary | ICD-10-CM

## 2024-04-09 MED ORDER — GABAPENTIN 400 MG PO CAPS: 400.0000 mg | ORAL_CAPSULE | Freq: Four times a day (QID) | ORAL | 1 refills | Status: AC

## 2024-04-09 MED ORDER — TRAZODONE HCL 50 MG PO TABS
50.0000 mg | ORAL_TABLET | Freq: Every day | ORAL | 6 refills | Status: AC
Start: 2024-04-09 — End: ?

## 2024-04-09 NOTE — Assessment & Plan Note (Signed)
 Major depressive disorder, single episode, unspecified   Major depressive disorder is managed with Zoloft  50 mg daily. He reports improvement in symptoms and satisfaction with the medication. His PHQ score improved from 22 to 6, indicating significant improvement. Continue Zoloft  50 mg daily. Perform PHQ screening to monitor depression severity. Consider dose adjustment based on PHQ score and preference. Discuss potential side effects of Zoloft , including erectile dysfunction. Advise against abrupt discontinuation of Zoloft .  Alcohol use disorder, in remission   Alcohol use disorder is in remission. He reports no alcohol consumption and uses gabapentin  to manage cravings. Discussed the importance of avoiding alcohol due to severe liver injury. Gabapentin  prescription was corrected to 400 mg four times a day. Emphasized the need for vitamins B1 and B12 to prevent permanent nerve damage. Continue gabapentin  400 mg four times a day. Avoid alcohol consumption. Consider referral to therapy or support groups for additional coping strategies. Continue B1 and B12 supplementation.  Alcoholic liver disease, unspecified   Alcoholic liver disease is well-managed with no reported symptoms of liver tenderness or fluid gain. Advised to avoid alcohol and junk food to prevent further liver damage. Emphasized the importance of nutrition and vitamins, particularly B1 and B12, to support liver health. Discussed the risks of herbal supplements, particularly ashwagandha, which can harm the liver. Avoid alcohol and junk food. Continue B1 and B12 supplementation. Avoid herbal supplements, especially ashwagandha. Emphasize the importance of nutrition and vitamins for liver health.  Restless legs syndrome   Restless legs syndrome was not discussed in detail during the encounter.  General Health Maintenance   Discussed the importance of exercise and vitamin D  supplementation to improve energy and mood. Recommended TRX suspension  trainer for home exercise to enhance physical fitness without gym equipment. Continue vitamin D  supplementation. Consider purchasing TRX suspension trainer for home exercise. Encourage regular physical activity to improve mood and energy levels.

## 2024-04-09 NOTE — Patient Instructions (Addendum)
 It was a pleasure seeing you today! Your health and satisfaction are our top priorities.  John Cone, MD  VISIT SUMMARY: During your visit, we discussed your current medications and overall health. You reported that your depression is improving with Zoloft , and you are managing your alcohol use disorder well. We also reviewed your liver health and general wellness, including the importance of exercise and vitamin D  supplementation.  YOUR PLAN: -MAJOR DEPRESSIVE DISORDER: Major depressive disorder is a mental health condition characterized by persistent feelings of sadness and loss of interest. You are currently taking Zoloft  50 mg daily, which has significantly improved your symptoms. We will continue this dosage and monitor your progress with regular PHQ screenings. Be aware of potential side effects, including erectile dysfunction, and do not stop taking Zoloft  abruptly.  -ALCOHOL USE DISORDER, IN REMISSION: Alcohol use disorder is a condition where a person has difficulty controlling their alcohol consumption. You are currently sober and managing cravings with gabapentin  400 mg four times a day. It is crucial to avoid alcohol due to your history of severe liver injury. Continue taking vitamins B1 and B12 to prevent nerve damage, and consider therapy or support groups for additional coping strategies.  -ALCOHOLIC LIVER DISEASE: Alcoholic liver disease is liver damage caused by excessive alcohol consumption. Your condition is well-managed with no current symptoms. It is important to avoid alcohol and junk food to prevent further liver damage. Continue taking vitamins B1 and B12, and avoid herbal supplements like ashwagandha that can harm the liver. Focus on good nutrition to support liver health.  -GENERAL HEALTH MAINTENANCE: Maintaining good health involves regular exercise and proper nutrition. Continue taking vitamin D  supplements to improve your energy and mood. Consider using a TRX suspension  trainer for home exercise to enhance your physical fitness without needing gym equipment. Regular physical activity is important for your overall well-being.  INSTRUCTIONS: Continue taking Zoloft  50 mg daily and monitor your symptoms with regular PHQ screenings. Take gabapentin  400 mg four times a day and avoid alcohol. Continue vitamins B1 and B12 supplementation. Avoid herbal supplements, especially ashwagandha. Maintain a healthy diet and consider using a TRX suspension trainer for home exercise. Follow up with therapy or support groups as needed.  Your Providers PCP: Maxwell John MATSU, MD,  [phone]) Referring Provider: Cone John MATSU, MD,  [phone]) Care Team Provider: Myrna Shaver, MD,  [phone]) Care Team Provider: Kriss Estefana DEL, DO,  [phone])  NEXT STEPS: [x]  Early Intervention: Schedule sooner appointment, call our on-call services, or go to emergency room if there is any significant Increase in pain or discomfort New or worsening symptoms Sudden or severe changes in your health [x]  Flexible Follow-Up: We recommend a No follow-ups on file. for optimal routine care. This allows for progress monitoring and treatment adjustments. [x]  Preventive Care: Schedule your annual preventive care visit! It's typically covered by insurance and helps identify potential health issues early. [x]  Lab & X-ray Appointments: Incomplete tests scheduled today, or call to schedule. X-rays: [facility] (M-F, 8:30am-noon or 1pm-5pm). [x]  Medical Information Release: Sign a release form at front desk to obtain relevant medical information we don't have.  MAKING THE MOST OF OUR FOCUSED 20 MINUTE APPOINTMENTS: [x]   Clearly state your top concerns at the beginning of the visit to focus our discussion [x]   If you anticipate you will need more time, please inform the front desk during scheduling - we can book multiple appointments in the same week. [x]   If you have  transportation problems- use our convenient video appointments or ask about transportation support. [x]   We can get down to business faster if you use MyChart to update information before the visit and submit non-urgent questions before your visit. Thank you for taking the time to provide details through MyChart.  Let our nurse know and she can import this information into your encounter documents.  Arrival and Wait Times: [x]   Arriving on time ensures that everyone receives prompt attention. [x]   Early morning (8a) and afternoon (1p) appointments tend to have shortest wait times. [x]   Unfortunately, we cannot delay appointments for late arrivals or hold slots during phone calls.  Getting Answers and Following Up [x]   Simple Questions & Concerns: For quick questions or basic follow-up after your visit, reach us  at [phone] or MyChart messaging. [x]   Complex Concerns: If your concern is more complex, scheduling an appointment might be best. Discuss this with the staff to find the most suitable option. [x]   Lab & Imaging Results: We'll contact you directly if results are abnormal or you don't use MyChart. Most normal results will be on MyChart within 2-3 business days, with a review message from Dr. Jesus. Haven't heard back in 2 weeks? Need results sooner? Contact us  at [phone]. [x]   Referrals: Our referral coordinator will manage specialist referrals. The specialist's office should contact you within 2 weeks to schedule an appointment. Call us  if you haven't heard from them after 2 weeks.  Staying Connected [x]   MyChart: Activate your MyChart for the fastest way to access results and message us . See the last page of this paperwork for instructions on how to activate.  Bring to Your Next Appointment [x]   Medications: Please bring all your medication bottles to your next appointment to ensure we have an accurate record of your prescriptions. [x]   Health Diaries: If you're monitoring  any health conditions at home, keeping a diary of your readings can be very helpful for discussions at your next appointment.  Billing [x]   X-ray & Lab Orders: These are billed by separate companies. Contact the invoicing company directly for questions or concerns. [x]   Visit Charges: Discuss any billing inquiries with our administrative services team.  Your Satisfaction Matters [x]   Share Your Experience: We strive for your satisfaction! If you have any complaints, or preferably compliments, please let Dr. Jesus know directly or contact our Practice Administrators, Manuelita Rubin or [employer], by asking at the front desk.   Reviewing Your Records [x]   Review this early draft of your clinical encounter notes below and the final encounter summary tomorrow on MyChart after its been completed.  All orders placed so far are visible here: Depression, unspecified depression type  Alcohol use disorder -     Gabapentin ; Take 1 capsule (400 mg total) by mouth 4 (four) times daily.  Dispense: 360 capsule; Refill: 1  Insomnia, unspecified type -     traZODone  HCl; Take 1 tablet (50 mg total) by mouth at bedtime.  Dispense: 30 tablet; Refill: 6         TRX and Suspension Trainers Feature Why It Matters  Weight capacity If it can't reliably take your weight + dynamic movement, it's a safety risk. Some good ones support 300-350 lbs. (http://www.castillo-fisher.org/)  Good anchors Door anchor + suspension anchor (for beams/trees) + possibly wrap-around straps extend where you can use it. (http://www.castillo-fisher.org/)  Durable straps & hardware Strong webbing, solid buckles/carabiners, metal adjusters where possible. Cheaper units tend to have weaker plastic  parts. (Total Shape)  Handle comfort & foot cradles Handles should allow grip; foot cradles matter if you'll do lower-body or more dynamic moves. (loopsworld.com)  Easy adjustment & good strap markings Makes transitions smoother; helps ensure straps are of equal length. (Total  Shape)  Warranty / support & extras A good brand usually gives a longer warranty or better customer service, and useful extras (travel bag, video guides, etc.) bring value. (Total Shape)   ?? Top Picks for Best Value Based on recent review roundups, here are some systems that are repeatedly identified as good value: System What Makes It Good Value / Pros Considerations  TRX Home2 System High quality build; includes door/suspension anchors; solid reputation. Often picked as "best overall" or "best value" among mid-to-upper-tier systems.  Price tends to be higher than generic straps. Handles are foam (comfortable but less long-lasting than rubber/textured).   [employer] allows for wide/narrow/neutral angles; good versatility; fairly comfortable handles.  Some of the parts (foot cradles or smaller anchor hardware) may be less premium. Strap-length adjusters etc. not always as high end.  NOSSK Twin PRO / simpler brands Often cheaper; good for beginners; many users find NOSSK gives nearly TRX-level functionality for less money.  May cut cost in comfort, grip, or anchoring options; may have less durable materials; fewer extras.  GoFit Gravity Straps Highly adjustable, decent capacity; good for outdoor / travel.  Sometimes lacking in strap length markings or premium hardware; anchors might be more basic.   ?? What "Best Value" Looks Like by Price Tier To help you pick, here's roughly what to expect at different price levels ([place]  market): Price Range What You Get Good Value Examples  $30-$60 Basic straps, maybe one door anchor, simpler handles, lighter duty hardware. Decent for casual / travel use. Some NOSSK or generic brands; often what reviewers call "budget" options.  $60-$120 Much better build, more anchoring options, better handles/cradles, extras (video guides, travel bag, etc.). TRX Home2, [employer], good mid-tier brands.  $120+ [employer], lots of accessories,  long warranties, often used in gyms or by serious users. TRX Pro or similar.

## 2024-04-09 NOTE — Progress Notes (Signed)
 ==============================  [place] [facility]: [id number]   -- Medical Office Visit --  Patient: John Maxwell      Age: 43 y.o.       Sex:  male  Date:   04/09/2024 Today's Healthcare Provider: Bernardino KANDICE Cone, MD  ==============================   Chief Complaint: Medication Problem (Follow up on medication and new medication he was put on)  Discussed the use of AI scribe software for clinical note transcription with the patient, who gave verbal consent to proceed.  History of Present Illness  43 year old male with depression and alcohol use disorder who presents for a medication update.  He has been taking Zoloft  50 mg daily for depression, which he feels is working well. Initially, the first two weeks were challenging, but he has been feeling better over the last two weeks. He takes the medication every morning after work and experiences no persistent agitation or side effects.  He has a history of alcohol use disorder and is currently sober. He was previously prescribed gabapentin  400 mg four times a day to help with alcohol cravings. He reports an issue with his prescription being filled incorrectly but has not consumed alcohol and feels his risk of recurrence is zero. He also takes acamprosate  and trazodone  as needed, ensuring not to take trazodone  with Zoloft  to avoid interactions.  He has a history of liver injury due to alcohol use but reports no current alcohol consumption. He denies liver tenderness, fluid gain, or gastrointestinal bleeding. He is taking measures to avoid alcohol and maintain a healthy diet.  He is married with one daughter and is trying to have another child. He is aware of the potential impact of Zoloft  on erectile function and has a prescription for Viagra . He is not taking testosterone .  He reports no issues with sleep. He describes difficulty staying consistent with activities and needing to reread sentences, and was  told by someone that this could be related to ADHD. He is not currently exercising regularly but is taking vitamin D  supplements.      04/09/2024    1:40 PM 03/01/2024   10:13 AM 05/30/2023   10:03 AM  PHQ9 SCORE ONLY  PHQ-9 Total Score 6 22 12       Data saved with a previous flowsheet row definition    Background Reviewed: Problem List: has Alcohol use disorder; Alcoholic cirrhosis of liver (HCC); Anemia; Folate deficiency; Portal hypertension (HCC); Restless legs; Undifferentiated inflammatory arthritis; Erectile dysfunction; Other fatigue; Insomnia; ANA positive; Low testosterone  in male; Reactive gastropathy; Rash; Night sweats; Vitamin D  deficiency; Hepatic osteodystrophy; and Autoimmune disorder on their problem list. Past Medical History:  has a past medical history of Acute hepatic encephalopathy (HCC) (08/23/2022), Alcohol dependence with hallucinations (HCC) (08/21/2022), Alcoholic cirrhosis of liver (HCC) (08/20/2022), Alcoholic hepatitis with ascites (HCC) (08/30/2022), Anxiety, Arthritis (11/02/2022), Delirium tremens (HCC) (08/20/2022), Depression, Drug-induced hypokalemia (08/21/2022), Elevated bilirubin (09/06/2022), Encephalopathy, hepatic (HCC) (08/23/2022), Hyponatremia (08/21/2022), Liver failure without hepatic coma (HCC) (08/20/2022), Liver mass, ruled out (08/20/2022), Overweight (08/20/2022), Panic attack (08/20/2022), Paronychia of finger of right hand (12/25/2017), Substance abuse (HCC), and Thrombocytopenia (08/22/2022). Past Surgical History:   has a past surgical history that includes Appendectomy; IR Paracentesis (09/03/2022); Esophagogastroduodenoscopy (egd) with propofol  (N/A, 11/23/2022); and biopsy (11/23/2022). Social History:   reports that he has quit smoking. His smoking use included cigarettes. He has never used smokeless tobacco. He reports current alcohol use. He reports that he does not use drugs. Family History:  family  history includes Depression in his father;  Diabetes in his father; Heart attack in his father; Heart disease in his father; Mental illness in his father. Allergies:  is allergic to cyclobenzaprine and menthol (topical analgesic).   Medication Reconciliation: Current Outpatient Medications on File Prior to Visit  Medication Sig   acamprosate  (CAMPRAL ) 333 MG tablet Take 2 tablets (666 mg total) by mouth 3 (three) times daily with meals.   celecoxib  (CELEBREX ) 100 MG capsule TAKE 1 CAPSULE(100 MG) BY MOUTH TWICE DAILY   folic acid  (FOLVITE ) 1 MG tablet Take 1 tablet (1 mg total) by mouth daily.   sertraline  (ZOLOFT ) 50 MG tablet Take 1 tablet (50 mg total) by mouth daily. Start at half tablet for first week   sildenafil  (VIAGRA ) 100 MG tablet TAKE 1/2 TO 1 TABLET(50 TO 100 MG) BY MOUTH DAILY AS NEEDED FOR ERECTILE DYSFUNCTION   tadalafil  (CIALIS ) 20 MG tablet Take 0.5-1 tablets (10-20 mg total) by mouth every other day as needed for erectile dysfunction.   thiamine  (VITAMIN B-1) 100 MG tablet Take 1 tablet (100 mg total) by mouth daily.   Vitamin D , Ergocalciferol , (DRISDOL ) 1.25 MG (50000 UNIT) CAPS capsule Take 1 capsule (50,000 Units total) by mouth every 7 (seven) days.   No current facility-administered medications on file prior to visit.   Medications Discontinued During This Encounter  Medication Reason   gabapentin  (NEURONTIN ) 400 MG capsule Reorder   traZODone  (DESYREL ) 50 MG tablet Reorder     Physical Exam:    04/09/2024    1:24 PM 03/01/2024    9:57 AM 10/20/2023   11:03 AM  Vitals with BMI  Height 5' 7 5' 7 5' 7  Weight 163 lbs 3 oz 158 lbs 6 oz 144 lbs  BMI 25.55 24.8 22.55  Systolic 136 136 877  Diastolic 70 70 70  Pulse 80 93 85  Vital signs reviewed.  Nursing notes reviewed. Weight trend reviewed. Physical Activity: Sufficiently Active (11/19/2023)   Exercise Vital Sign    Days of Exercise per Week: 5 days    Minutes of Exercise per Session: 150+ min   General Appearance:  No acute distress appreciable.    Well-groomed, healthy-appearing male.  Well proportioned with no abnormal fat distribution.  Good muscle tone. Pulmonary:  Normal work of breathing at rest, no respiratory distress apparent. SpO2: 98 %  Musculoskeletal: All extremities are intact.  Neurological:  Awake, alert, oriented, and engaged.  No obvious focal neurological deficits or cognitive impairments.  Sensorium seems unclouded.   Speech is clear and coherent with logical content. Psychiatric:  Appropriate mood, pleasant and cooperative demeanor, thoughtful and engaged during the exam   Verbalized to patient: Physical Exam    Results:   Verbalized to patient: Results      04/09/2024    1:40 PM 03/01/2024   10:13 AM 05/30/2023   10:03 AM 04/13/2023    8:09 AM  PHQ 2/9 Scores  PHQ - 2 Score 2 5 3 4   PHQ- 9 Score 6 22 12 14     Lab on 11/23/2023  Component Date Value Ref Range Status   INR 11/23/2023 1.0  0.8 - 1.0 ratio Final   Prothrombin Time 11/23/2023 10.6  9.6 - 13.1 sec Final   WBC 11/23/2023 3.9 (L)  4.0 - 10.5 K/uL Final   RBC 11/23/2023 4.01 (L)  4.22 - 5.81 Mil/uL Final   Hemoglobin 11/23/2023 12.3 (L)  13.0 - 17.0 g/dL Final   HCT 94/71/7974 37.0 (L)  39.0 -  52.0 % Final   MCV 11/23/2023 92.2  78.0 - 100.0 fl Final   MCHC 11/23/2023 33.1  30.0 - 36.0 g/dL Final   RDW 94/71/7974 13.8  11.5 - 15.5 % Final   Platelets 11/23/2023 150.0  150.0 - 400.0 K/uL Final   Neutrophils Relative % 11/23/2023 42.9 (L)  43.0 - 77.0 % Final   Lymphocytes Relative 11/23/2023 46.6 (H)  12.0 - 46.0 % Final   Monocytes Relative 11/23/2023 8.9  3.0 - 12.0 % Final   Eosinophils Relative 11/23/2023 1.0  0.0 - 5.0 % Final   Basophils Relative 11/23/2023 0.6  0.0 - 3.0 % Final   Neutro Abs 11/23/2023 1.7  1.4 - 7.7 K/uL Final   Lymphs Abs 11/23/2023 1.8  0.7 - 4.0 K/uL Final   Monocytes Absolute 11/23/2023 0.3  0.1 - 1.0 K/uL Final   Eosinophils Absolute 11/23/2023 0.0  0.0 - 0.7 K/uL Final   Basophils Absolute 11/23/2023  0.0  0.0 - 0.1 K/uL Final   Sodium 11/23/2023 139  135 - 145 mEq/L Final   Potassium 11/23/2023 4.0  3.5 - 5.1 mEq/L Final   Chloride 11/23/2023 101  96 - 112 mEq/L Final   CO2 11/23/2023 32  19 - 32 mEq/L Final   Glucose, Bld 11/23/2023 100 (H)  70 - 99 mg/dL Final   BUN 94/71/7974 16  6 - 23 mg/dL Final   Creatinine, Ser 11/23/2023 0.67  0.40 - 1.50 mg/dL Final   Total Bilirubin 11/23/2023 0.4  0.2 - 1.2 mg/dL Final   Alkaline Phosphatase 11/23/2023 84  39 - 117 U/L Final   AST 11/23/2023 25  0 - 37 U/L Final   ALT 11/23/2023 17  0 - 53 U/L Final   Total Protein 11/23/2023 7.0  6.0 - 8.3 g/dL Final   Albumin 94/71/7974 4.2  3.5 - 5.2 g/dL Final   GFR 94/71/7974 116.78  >60.00 mL/min Final   Calcium  11/23/2023 8.8  8.4 - 10.5 mg/dL Final   Fibrinogen  11/23/2023 287  175 - 425 mg/dL Final  Office Visit on 04/13/2023  Component Date Value Ref Range Status   Cholesterol 04/13/2023 184  0 - 200 mg/dL Final   Triglycerides 89/83/7975 73.0  0.0 - 149.0 mg/dL Final   HDL 89/83/7975 73.20  >39.00 mg/dL Final   VLDL 89/83/7975 14.6  0.0 - 40.0 mg/dL Final   LDL Cholesterol 04/13/2023 96  0 - 99 mg/dL Final   Total CHOL/HDL Ratio 04/13/2023 3   Final   NonHDL 04/13/2023 110.79   Final   Sodium 04/13/2023 137  135 - 145 mEq/L Final   Potassium 04/13/2023 4.0  3.5 - 5.1 mEq/L Final   Chloride 04/13/2023 102  96 - 112 mEq/L Final   CO2 04/13/2023 29  19 - 32 mEq/L Final   Glucose, Bld 04/13/2023 99  70 - 99 mg/dL Final   BUN 89/83/7975 11  6 - 23 mg/dL Final   Creatinine, Ser 04/13/2023 0.65  0.40 - 1.50 mg/dL Final   Total Bilirubin 04/13/2023 0.6  0.2 - 1.2 mg/dL Final   Alkaline Phosphatase 04/13/2023 136 (H)  39 - 117 U/L Final   AST 04/13/2023 23  0 - 37 U/L Final   ALT 04/13/2023 18  0 - 53 U/L Final   Total Protein 04/13/2023 7.0  6.0 - 8.3 g/dL Final   Albumin 89/83/7975 4.2  3.5 - 5.2 g/dL Final   GFR 89/83/7975 118.37  >60.00 mL/min Final   Calcium  04/13/2023 9.3  8.4 -  10.5  mg/dL Final   WBC 89/83/7975 5.3  4.0 - 10.5 K/uL Final   RBC 04/13/2023 4.18 (L)  4.22 - 5.81 Mil/uL Final   Hemoglobin 04/13/2023 12.7 (L)  13.0 - 17.0 g/dL Final   HCT 89/83/7975 38.8 (L)  39.0 - 52.0 % Final   MCV 04/13/2023 92.6  78.0 - 100.0 fl Final   MCHC 04/13/2023 32.9  30.0 - 36.0 g/dL Final   RDW 89/83/7975 13.6  11.5 - 15.5 % Final   Platelets 04/13/2023 181.0  150.0 - 400.0 K/uL Final   Neutrophils Relative % 04/13/2023 61.8  43.0 - 77.0 % Final   Lymphocytes Relative 04/13/2023 30.3  12.0 - 46.0 % Final   Monocytes Relative 04/13/2023 7.0  3.0 - 12.0 % Final   Eosinophils Relative 04/13/2023 0.5  0.0 - 5.0 % Final   Basophils Relative 04/13/2023 0.4  0.0 - 3.0 % Final   Neutro Abs 04/13/2023 3.3  1.4 - 7.7 K/uL Final   Lymphs Abs 04/13/2023 1.6  0.7 - 4.0 K/uL Final   Monocytes Absolute 04/13/2023 0.4  0.1 - 1.0 K/uL Final   Eosinophils Absolute 04/13/2023 0.0  0.0 - 0.7 K/uL Final   Basophils Absolute 04/13/2023 0.0  0.0 - 0.1 K/uL Final   TSH 04/13/2023 1.330  0.450 - 4.500 uIU/mL Final   Cholesterol 04/13/2023 185  <200 mg/dL Final   HDL 89/83/7975 74  > OR = 40 mg/dL Final   Triglycerides 89/83/7975 72  <150 mg/dL Final   LDL Cholesterol (Calc) 04/13/2023 95  mg/dL (calc) Final   Total CHOL/HDL Ratio 04/13/2023 2.5  <4.9 (calc) Final   Non-HDL Cholesterol (Calc) 04/13/2023 111  <130 mg/dL (calc) Final   VITD 89/83/7975 15.41 (L)  30.00 - 100.00 ng/mL Final   Anti Nuclear Antibody (ANA) 04/13/2023 Positive (A)  Negative Final   dsDNA Ab 04/13/2023 11 (H)  0 - 9 IU/mL Final   ENA RNP Ab 04/13/2023 0.2  0.0 - 0.9 AI Final   ENA SM Ab Ser-aCnc 04/13/2023 <0.2  0.0 - 0.9 AI Final   Scleroderma (Scl-70) (ENA) Antibod* 04/13/2023 <0.2  0.0 - 0.9 AI Final   ENA SSA (RO) Ab 04/13/2023 <0.2  0.0 - 0.9 AI Final   ENA SSB (LA) Ab 04/13/2023 <0.2  0.0 - 0.9 AI Final   Chromatin Ab SerPl-aCnc 04/13/2023 <0.2  0.0 - 0.9 AI Final   Anti JO-1 04/13/2023 <0.2  0.0 - 0.9 AI Final    Centromere Ab Screen 04/13/2023 <0.2  0.0 - 0.9 AI Final   See below: 04/13/2023 Comment   Final   Sed Rate 04/13/2023 19 (H)  0 - 15 mm/hr Final   CRP 04/13/2023 <1.0  0.5 - 20.0 mg/dL Final   Iron 89/83/7975 103  42 - 165 ug/dL Final   Transferrin 89/83/7975 222.0  212.0 - 360.0 mg/dL Final   Saturation Ratios 04/13/2023 33.1  20.0 - 50.0 % Final   Ferritin 04/13/2023 52.4  22.0 - 322.0 ng/mL Final   TIBC 04/13/2023 310.8  250.0 - 450.0 mcg/dL Final   Testosterone  04/13/2023 328.34  300.00 - 890.00 ng/dL Final  Office Visit on 12/14/2022  Component Date Value Ref Range Status   WBC 12/14/2022 3.6 (L)  4.0 - 10.5 K/uL Final   RBC 12/14/2022 4.41  4.22 - 5.81 Mil/uL Final   Hemoglobin 12/14/2022 13.3  13.0 - 17.0 g/dL Final   HCT 93/81/7975 39.7  39.0 - 52.0 % Final   MCV 12/14/2022 90.1  78.0 -  100.0 fl Final   MCHC 12/14/2022 33.4  30.0 - 36.0 g/dL Final   RDW 93/81/7975 13.5  11.5 - 15.5 % Final   Platelets 12/14/2022 170.0  150.0 - 400.0 K/uL Final   Neutrophils Relative % 12/14/2022 52.3  43.0 - 77.0 % Final   Lymphocytes Relative 12/14/2022 33.7  12.0 - 46.0 % Final   Monocytes Relative 12/14/2022 11.3  3.0 - 12.0 % Final   Eosinophils Relative 12/14/2022 2.2  0.0 - 5.0 % Final   Basophils Relative 12/14/2022 0.5  0.0 - 3.0 % Final   Neutro Abs 12/14/2022 1.9  1.4 - 7.7 K/uL Final   Lymphs Abs 12/14/2022 1.2  0.7 - 4.0 K/uL Final   Monocytes Absolute 12/14/2022 0.4  0.1 - 1.0 K/uL Final   Eosinophils Absolute 12/14/2022 0.1  0.0 - 0.7 K/uL Final   Basophils Absolute 12/14/2022 0.0  0.0 - 0.1 K/uL Final   Sodium 12/14/2022 137  135 - 145 mEq/L Final   Potassium 12/14/2022 4.3  3.5 - 5.1 mEq/L Final   Chloride 12/14/2022 101  96 - 112 mEq/L Final   CO2 12/14/2022 29  19 - 32 mEq/L Final   Glucose, Bld 12/14/2022 93  70 - 99 mg/dL Final   BUN 93/81/7975 13  6 - 23 mg/dL Final   Creatinine, Ser 12/14/2022 0.61  0.40 - 1.50 mg/dL Final   Total Bilirubin 12/14/2022 0.4  0.2 -  1.2 mg/dL Final   Alkaline Phosphatase 12/14/2022 120 (H)  39 - 117 U/L Final   AST 12/14/2022 32  0 - 37 U/L Final   ALT 12/14/2022 22  0 - 53 U/L Final   Total Protein 12/14/2022 7.7  6.0 - 8.3 g/dL Final   Albumin 93/81/7975 4.0  3.5 - 5.2 g/dL Final   GFR 93/81/7975 120.94  >60.00 mL/min Final   Calcium  12/14/2022 9.4  8.4 - 10.5 mg/dL Final   AFP-Tumor Marker 12/14/2022 2.5  <6.1 ng/mL Final   Hep B S Ab 12/14/2022 REACTIVE (A)  NON-REACTIVE Final   Hepatitis A AB,Total 12/14/2022 REACTIVE (A)  NON-REACTIVE Final   Sed Rate 12/14/2022 40 (H)  0 - 15 mm/hr Final   CRP 12/14/2022 <1.0  0.5 - 20.0 mg/dL Final  Admission on 94/71/7975, Discharged on 11/23/2022  Component Date Value Ref Range Status   SURGICAL PATHOLOGY 11/23/2022    Final-Edited                   Value:SURGICAL PATHOLOGY CASE: [id number] PATIENT: Johnpatrick Focht Surgical Pathology Report     Clinical History: 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 STOMACH, BIOPSY:      Gastric antral mucosa with reactive gastropathy.      Gastric oxyntic mucosa with no significant diagnostic alteration.      No H. pylori identified on HE.      Negative for intestinal metaplasia or dysplasia.   GROSS DESCRIPTION:  Received in formalin are tan, soft tissue fragments that are submitted in toto. Number: 5 size: 0.2-0.4 cm blocks: 1 (GRP 11/15/2022)   Final Diagnosis performed by Pepper Dutton, MD.   Electronically signed 11/24/2022 Technical component performed at [facility], [address], [place], [place] [address].  Professional component performed at [employer], [address], [place], [place] [address].  Immunohistochemistry Technical component (if applicable) was performed  at [facility]. [address], [address], [place], [place] [address].   IMMUNOHISTOCHEMISTRY DISCLAIMER (if  applicable): Some of these immunohistochemical stains may have been developed and the performance characteristics determine by [facility]. Some may not have been cleared or approved by the U.S. Food and Drug Administration. The FDA has determined that such clearance or approval is not necessary. This test is used for clinical purposes. It should not be regarded as investigational or for research. This laboratory is certified under the Clinical Laboratory Improvement Amendments of 1988 (CLIA-88) as qualified to perform high complexity clinical laboratory testing.  The controls stained appropriately.   Office Visit on 11/02/2022  Component Date Value Ref Range Status   Rheumatoid fact SerPl-aCnc 11/02/2022 <10  <14 IU/mL Final   Cyclic Citrullin Peptide Ab 94/92/7975 <16  UNITS Final   Testosterone  11/02/2022 362.20  300.00 - 890.00 ng/dL Final   VITD 94/92/7975 12.47 (L)  30.00 - 100.00 ng/mL Final   Sed Rate 11/02/2022 29 (H)  0 - 15 mm/hr Final   CRP 11/02/2022 <1.0  0.5 - 20.0 mg/dL Final   Anti Nuclear Antibody (ANA) 11/02/2022 POSITIVE (A)  NEGATIVE Final   Total CK 11/02/2022 119  7 - 232 U/L Final   Sodium 11/02/2022 137  135 - 145 mEq/L Final   Potassium 11/02/2022 4.1  3.5 - 5.1 mEq/L Final   Chloride 11/02/2022 101  96 - 112 mEq/L Final   CO2 11/02/2022 28  19 - 32 mEq/L Final   Glucose, Bld 11/02/2022 97  70 - 99 mg/dL Final   BUN 94/92/7975 10  6 - 23 mg/dL Final   Creatinine, Ser 11/02/2022 0.61  0.40 - 1.50 mg/dL Final   Total Bilirubin 11/02/2022 0.9  0.2 - 1.2 mg/dL Final   Alkaline Phosphatase 11/02/2022 105  39 - 117 U/L Final   AST 11/02/2022 37  0 - 37 U/L Final   ALT 11/02/2022 20  0 - 53 U/L Final   Total Protein 11/02/2022 6.8  6.0 - 8.3 g/dL Final   Albumin 94/92/7975 3.6  3.5 - 5.2 g/dL Final   GFR 94/92/7975 121.04  >60.00 mL/min Final   Calcium  11/02/2022 9.3  8.4 - 10.5 mg/dL Final   Testosterone  11/02/2022 649  264 - 916 ng/dL Final    Testosterone , Free 11/02/2022 2.1 (L)  8.7 - 25.1 pg/mL Final   Sex Hormone Binding 11/02/2022 139.0 (H)  16.5 - 55.9 nmol/L Final   WBC 11/02/2022 5.2  4.0 - 10.5 K/uL Final   RBC 11/02/2022 3.91 (L)  4.22 - 5.81 Mil/uL Final   Hemoglobin 11/02/2022 12.3 (L)  13.0 - 17.0 g/dL Final   HCT 94/92/7975 37.4 (L)  39.0 - 52.0 % Final   MCV 11/02/2022 95.5  78.0 - 100.0 fl Final   MCHC 11/02/2022 33.0  30.0 - 36.0 g/dL Final   RDW 94/92/7975 15.5  11.5 - 15.5 % Final   Platelets 11/02/2022 221.0  150.0 - 400.0 K/uL Final   Neutrophils Relative % 11/02/2022 46.5  43.0 - 77.0 % Final   Lymphocytes Relative 11/02/2022 36.5  12.0 - 46.0 % Final   Monocytes Relative 11/02/2022 14.3 (H)  3.0 - 12.0 % Final   Eosinophils Relative 11/02/2022 1.9  0.0 - 5.0 % Final   Basophils Relative 11/02/2022 0.8  0.0 - 3.0 % Final   Neutro Abs 11/02/2022 2.4  1.4 - 7.7 K/uL Final   Lymphs Abs 11/02/2022 1.9  0.7 - 4.0 K/uL Final  Monocytes Absolute 11/02/2022 0.7  0.1 - 1.0 K/uL Final   Eosinophils Absolute 11/02/2022 0.1  0.0 - 0.7 K/uL Final   Basophils Absolute 11/02/2022 0.0  0.0 - 0.1 K/uL Final   ANA Titer 1 11/02/2022 1:40 (H)  titer Final   ANA Pattern 1 11/02/2022 Mitotic, Spindle Fibers (A)   Final   ANA TITER 11/02/2022 1:40 (H)  titer Final   ANA PATTERN 11/02/2022 Cytoplasmic (A)   Final   ANA TITER 11/02/2022 1:40 (H)  titer Final   ANA PATTERN 11/02/2022 Nuclear, Fine Speckled (A)   Final  Scanned Document on 09/07/2022  Component Date Value Ref Range Status   EGFR 09/02/2022 123.0   Final   INR 09/02/2022 1.10  0.80 - 1.20 Final  Hospital Outpatient Visit on 09/03/2022  Component Date Value Ref Range Status   Fluid Type-FCT 09/03/2022 PERITONEAL   Corrected   Color, Fluid 09/03/2022 YELLOW  YELLOW Final   Appearance, Fluid 09/03/2022 HAZY (A)  CLEAR Final   Total Nucleated Cell Count, Fluid 09/03/2022 210  0 - 1,000 cu mm Final   Neutrophil Count, Fluid 09/03/2022 7  0 - 25 % Final    Lymphs, Fluid 09/03/2022 32  % Final   Monocyte-Macrophage-Serous Fluid 09/03/2022 61  50 - 90 % Final   Eos, Fluid 09/03/2022 0  % Final   Other Cells, Fluid 09/03/2022 OTHER CELLS IDENTIFIED AS MESOTHELIAL CELLS  % Final   Albumin, Fluid 09/03/2022 <1.5  g/dL Final   Fluid Type-FALB 09/03/2022 PERITONEAL   Corrected   Path Review 09/03/2022 NEGATIVE FOR MALIGNANCY. BENIGN MESOTHELIAL CELLS AND CHRONIC INFLAMMATORY CELLS   Final  Admission on 08/21/2022, Discharged on 08/25/2022  Component Date Value Ref Range Status   Sodium 08/21/2022 127 (L)  135 - 145 mmol/L Final   Potassium 08/21/2022 3.1 (L)  3.5 - 5.1 mmol/L Final   Chloride 08/21/2022 87 (L)  98 - 111 mmol/L Final   CO2 08/21/2022 30  22 - 32 mmol/L Final   Glucose, Bld 08/21/2022 100 (H)  70 - 99 mg/dL Final   BUN 97/75/7975 9  6 - 20 mg/dL Final   Creatinine, Ser 08/21/2022 0.53 (L)  0.61 - 1.24 mg/dL Final   Calcium  08/21/2022 7.9 (L)  8.9 - 10.3 mg/dL Final   Total Protein 97/75/7975 6.7  6.5 - 8.1 g/dL Final   Albumin 97/75/7975 2.5 (L)  3.5 - 5.0 g/dL Final   AST 97/75/7975 257 (H)  15 - 41 U/L Final   ALT 08/21/2022 73 (H)  0 - 44 U/L Final   Alkaline Phosphatase 08/21/2022 232 (H)  38 - 126 U/L Final   Total Bilirubin 08/21/2022 7.5 (H)  0.3 - 1.2 mg/dL Final   GFR, Estimated 08/21/2022 >60  >60 mL/min Final   Anion gap 08/21/2022 10  5 - 15 Final   Alcohol, Ethyl (B) 08/21/2022 <10  <10 mg/dL Final   WBC 97/75/7975 13.3 (H)  4.0 - 10.5 K/uL Final   RBC 08/21/2022 3.00 (L)  4.22 - 5.81 MIL/uL Final   Hemoglobin 08/21/2022 12.0 (L)  13.0 - 17.0 g/dL Final   HCT 97/75/7975 32.5 (L)  39.0 - 52.0 % Final   MCV 08/21/2022 108.3 (H)  80.0 - 100.0 fL Final   MCH 08/21/2022 40.0 (H)  26.0 - 34.0 pg Final   MCHC 08/21/2022 36.9 (H)  30.0 - 36.0 g/dL Final   RDW 97/75/7975 15.3  11.5 - 15.5 % Final   Platelets 08/21/2022 123 (L)  150 - 400 K/uL Final   nRBC 08/21/2022 0.0  0.0 - 0.2 % Final   Opiates 08/21/2022 NONE  DETECTED  NONE DETECTED Final   Cocaine 08/21/2022 NONE DETECTED  NONE DETECTED Final   Benzodiazepines 08/21/2022 NONE DETECTED  NONE DETECTED Final   Amphetamines 08/21/2022 NONE DETECTED  NONE DETECTED Final   Tetrahydrocannabinol 08/21/2022 NONE DETECTED  NONE DETECTED Final   Barbiturates 08/21/2022 NONE DETECTED  NONE DETECTED Final   Lipase 08/21/2022 39  11 - 51 U/L Final   Prothrombin Time 08/21/2022 16.1 (H)  11.4 - 15.2 seconds Final   INR 08/21/2022 1.3 (H)  0.8 - 1.2 Final   Ammonia 08/21/2022 28  9 - 35 umol/L Final   Sodium, Ur 08/21/2022 <10  mmol/L Final   Chloride Urine 08/21/2022 19  mmol/L Final   HIV Screen 4th Generation wRfx 08/21/2022 Non Reactive  Non Reactive Final   Osmolality 08/21/2022 283  275 - 295 mOsm/kg Final   Vitamin B-12 08/21/2022 452  180 - 914 pg/mL Final   Folate 08/21/2022 1.6 (L)  >5.9 ng/mL Final   Osmolality, Ur 08/21/2022 776  300 - 900 mOsm/kg Final   WBC 08/22/2022 9.4  4.0 - 10.5 K/uL Final   RBC 08/22/2022 2.95 (L)  4.22 - 5.81 MIL/uL Final   Hemoglobin 08/22/2022 11.6 (L)  13.0 - 17.0 g/dL Final   HCT 97/74/7975 32.9 (L)  39.0 - 52.0 % Final   MCV 08/22/2022 111.5 (H)  80.0 - 100.0 fL Final   MCH 08/22/2022 39.3 (H)  26.0 - 34.0 pg Final   MCHC 08/22/2022 35.3  30.0 - 36.0 g/dL Final   RDW 97/74/7975 15.9 (H)  11.5 - 15.5 % Final   Platelets 08/22/2022 105 (L)  150 - 400 K/uL Final   nRBC 08/22/2022 0.2  0.0 - 0.2 % Final   Sodium 08/22/2022 126 (L)  135 - 145 mmol/L Final   Potassium 08/22/2022 3.1 (L)  3.5 - 5.1 mmol/L Final   Chloride 08/22/2022 89 (L)  98 - 111 mmol/L Final   CO2 08/22/2022 31  22 - 32 mmol/L Final   Glucose, Bld 08/22/2022 104 (H)  70 - 99 mg/dL Final   BUN 97/74/7975 12  6 - 20 mg/dL Final   Creatinine, Ser 08/22/2022 0.61  0.61 - 1.24 mg/dL Final   Calcium  08/22/2022 7.7 (L)  8.9 - 10.3 mg/dL Final   Total Protein 97/74/7975 6.4 (L)  6.5 - 8.1 g/dL Final   Albumin 97/74/7975 2.5 (L)  3.5 - 5.0 g/dL Final    AST 97/74/7975 186 (H)  15 - 41 U/L Final   ALT 08/22/2022 60 (H)  0 - 44 U/L Final   Alkaline Phosphatase 08/22/2022 208 (H)  38 - 126 U/L Final   Total Bilirubin 08/22/2022 6.3 (H)  0.3 - 1.2 mg/dL Final   GFR, Estimated 08/22/2022 >60  >60 mL/min Final   Anion gap 08/22/2022 6  5 - 15 Final   Hepatitis B Surface Ag 08/23/2022 NON REACTIVE  NON REACTIVE Final   HCV Ab 08/23/2022 NON REACTIVE  NON REACTIVE Final   Hep A IgM 08/23/2022 NON REACTIVE  NON REACTIVE Final   Hep B C IgM 08/23/2022 NON REACTIVE  NON REACTIVE Final   Ammonia 08/23/2022 53 (H)  9 - 35 umol/L Final   Sodium 08/23/2022 130 (L)  135 - 145 mmol/L Final   Potassium 08/23/2022 3.4 (L)  3.5 - 5.1 mmol/L Final   Chloride 08/23/2022 91 (L)  98 - 111 mmol/L Final   CO2 08/23/2022 32  22 - 32 mmol/L Final   Glucose, Bld 08/23/2022 111 (H)  70 - 99 mg/dL Final   BUN 97/73/7975 10  6 - 20 mg/dL Final   Creatinine, Ser 08/23/2022 0.59 (L)  0.61 - 1.24 mg/dL Final   Calcium  08/23/2022 8.0 (L)  8.9 - 10.3 mg/dL Final   Total Protein 97/73/7975 6.1 (L)  6.5 - 8.1 g/dL Final   Albumin 97/73/7975 2.4 (L)  3.5 - 5.0 g/dL Final   AST 97/73/7975 155 (H)  15 - 41 U/L Final   ALT 08/23/2022 54 (H)  0 - 44 U/L Final   Alkaline Phosphatase 08/23/2022 189 (H)  38 - 126 U/L Final   Total Bilirubin 08/23/2022 4.3 (H)  0.3 - 1.2 mg/dL Final   GFR, Estimated 08/23/2022 >60  >60 mL/min Final   Anion gap 08/23/2022 7  5 - 15 Final   Prothrombin Time 08/23/2022 16.0 (H)  11.4 - 15.2 seconds Final   INR 08/23/2022 1.3 (H)  0.8 - 1.2 Final   WBC 08/23/2022 8.4  4.0 - 10.5 K/uL Final   RBC 08/23/2022 2.78 (L)  4.22 - 5.81 MIL/uL Final   Hemoglobin 08/23/2022 11.1 (L)  13.0 - 17.0 g/dL Final   HCT 97/73/7975 31.9 (L)  39.0 - 52.0 % Final   MCV 08/23/2022 114.7 (H)  80.0 - 100.0 fL Final   MCH 08/23/2022 39.9 (H)  26.0 - 34.0 pg Final   MCHC 08/23/2022 34.8  30.0 - 36.0 g/dL Final   RDW 97/73/7975 16.0 (H)  11.5 - 15.5 % Final   Platelets  08/23/2022 113 (L)  150 - 400 K/uL Final   nRBC 08/23/2022 0.0  0.0 - 0.2 % Final   CYTOLOGY - NON GYN 08/23/2022    Final-Edited                   Value:CYTOLOGY - NON PAP CASE: [id number] PATIENT: Seth Sabala Non-Gynecological Cytology Report     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 ASCITES, PARACENTESIS:   FINAL MICROSCOPIC DIAGNOSIS: - No malignant cells identified - Numerous benign mesothelial cells with scattered lymphocytes within a serous background  SPECIMEN ADEQUACY: Satisfactory for evaluation  GROSS: Received is/are 1000 ccs of yellow fluid. (CM:cm) Prepared: Smears:  0 Concentration Method (Thin Prep):  1 Cell Block:  1 Additional Studies:  n/a     Final Diagnosis performed by Prentice Pitcher, MD.   Electronically signed 08/25/2022 Technical component performed at [facility], [address] [place], [place] [address].  Professional component performed at [facility], [address], [place], [place] [address].  Immunohistochemistry Technical component (if applicable) was performed at [facility]. [address], [place], [place] [address].   IMMUNOHISTOCHEMISTRY DISCLAIMER (if applicable): Some of these immunohistochemical stains may have been developed and the performance characteristics determine by [facility]. Some may not have been cleared or approved by the U.S. Food and Drug Administration. The FDA has determined that such clearance or approval is not necessary. This test is used for clinical purposes. It should not be regarded as investigational or for research. This laboratory is certified under the Clinical Laboratory Improvement Amendments of 1988 (CLIA-88) as qualified to perform high complexity clinical  laboratory testing.  The controls stained appropriately.    Fluid Type-FCT  08/23/2022 CYTO PERI   Final   Color, Fluid 08/23/2022 YELLOW (A)  YELLOW Final   Appearance, Fluid 08/23/2022 CLEAR  CLEAR Final   Total Nucleated Cell Count, Fluid 08/23/2022 173  0 - 1,000 cu mm Final   Neutrophil Count, Fluid 08/23/2022 7  0 - 25 % Final   Lymphs, Fluid 08/23/2022 7  % Final   Monocyte-Macrophage-Serous Fluid 08/23/2022 86  50 - 90 % Final   Eos, Fluid 08/23/2022 0  % Final   Albumin, Fluid 08/23/2022 <1.5  g/dL Final   Fluid Type-FALB 08/23/2022 CYTO PERI   Final   WBC 08/24/2022 8.7  4.0 - 10.5 K/uL Final   RBC 08/24/2022 3.09 (L)  4.22 - 5.81 MIL/uL Final   Hemoglobin 08/24/2022 12.7 (L)  13.0 - 17.0 g/dL Final   HCT 97/72/7975 36.3 (L)  39.0 - 52.0 % Final   MCV 08/24/2022 117.5 (H)  80.0 - 100.0 fL Final   MCH 08/24/2022 41.1 (H)  26.0 - 34.0 pg Final   MCHC 08/24/2022 35.0  30.0 - 36.0 g/dL Final   RDW 97/72/7975 16.9 (H)  11.5 - 15.5 % Final   Platelets 08/24/2022 121 (L)  150 - 400 K/uL Final   nRBC 08/24/2022 0.2  0.0 - 0.2 % Final   Sodium 08/24/2022 131 (L)  135 - 145 mmol/L Final   Potassium 08/24/2022 3.6  3.5 - 5.1 mmol/L Final   Chloride 08/24/2022 94 (L)  98 - 111 mmol/L Final   CO2 08/24/2022 31  22 - 32 mmol/L Final   Glucose, Bld 08/24/2022 111 (H)  70 - 99 mg/dL Final   BUN 97/72/7975 9  6 - 20 mg/dL Final   Creatinine, Ser 08/24/2022 0.67  0.61 - 1.24 mg/dL Final   Calcium  08/24/2022 8.0 (L)  8.9 - 10.3 mg/dL Final   Total Protein 97/72/7975 6.5  6.5 - 8.1 g/dL Final   Albumin 97/72/7975 2.6 (L)  3.5 - 5.0 g/dL Final   AST 97/72/7975 139 (H)  15 - 41 U/L Final   ALT 08/24/2022 54 (H)  0 - 44 U/L Final   Alkaline Phosphatase 08/24/2022 187 (H)  38 - 126 U/L Final   Total Bilirubin 08/24/2022 4.5 (H)  0.3 - 1.2 mg/dL Final   GFR, Estimated 08/24/2022 >60  >60 mL/min Final   Anion gap 08/24/2022 6  5 - 15 Final   Prothrombin Time 08/24/2022 15.6 (H)  11.4 - 15.2 seconds Final   INR 08/24/2022 1.3 (H)  0.8 - 1.2 Final  Office Visit on  08/19/2022  Component Date Value Ref Range Status   AFP-Tumor Marker 08/19/2022 4.2  <6.1 ng/mL Final   Cholesterol 08/19/2022 128  0 - 200 mg/dL Final   Triglycerides 97/77/7975 96.0  0.0 - 149.0 mg/dL Final   HDL 97/77/7975 27.70 (L)  >60.99 mg/dL Final   VLDL 97/77/7975 19.2  0.0 - 40.0 mg/dL Final   LDL Cholesterol 08/19/2022 81  0 - 99 mg/dL Final   Total CHOL/HDL Ratio 08/19/2022 5   Final   NonHDL 08/19/2022 99.98   Final   TSH 08/19/2022 4.95  0.35 - 5.50 uIU/mL Final   Hgb A1c MFr Bld 08/19/2022 4.3 (L)  4.6 - 6.5 % Final   Sodium 08/19/2022 134 (L)  135 - 145 mEq/L Final   Potassium 08/19/2022 3.4 (L)  3.5 - 5.1 mEq/L Final   Chloride 08/19/2022 87 (L)  96 -  112 mEq/L Final   CO2 08/19/2022 33 (H)  19 - 32 mEq/L Final   Glucose, Bld 08/19/2022 95  70 - 99 mg/dL Final   BUN 97/77/7975 6  6 - 23 mg/dL Final   Creatinine, Ser 08/19/2022 0.71  0.40 - 1.50 mg/dL Final   Total Bilirubin 08/19/2022 3.6 (H)  0.2 - 1.2 mg/dL Final   Alkaline Phosphatase 08/19/2022 295 (H)  39 - 117 U/L Final   AST 08/19/2022 232 (H)  0 - 37 U/L Final   ALT 08/19/2022 56 (H)  0 - 53 U/L Final   Total Protein 08/19/2022 6.5  6.0 - 8.3 g/dL Final   Albumin 97/77/7975 3.0 (L)  3.5 - 5.2 g/dL Final   GFR 97/77/7975 115.78  >60.00 mL/min Final   Calcium  08/19/2022 8.2 (L)  8.4 - 10.5 mg/dL Final   WBC 97/77/7975 9.3  4.0 - 10.5 K/uL Final   RBC 08/19/2022 3.14 (L)  4.22 - 5.81 Mil/uL Final   Platelets 08/19/2022 150.0  150.0 - 400.0 K/uL Final   Hemoglobin 08/19/2022 12.7 (L)  13.0 - 17.0 g/dL Final   HCT 97/77/7975 36.9 (L)  39.0 - 52.0 % Final   MCV 08/19/2022 117.5 Repeated and verified X2. (H)  78.0 - 100.0 fl Final   MCHC 08/19/2022 34.5  30.0 - 36.0 g/dL Final   RDW 97/77/7975 15.7 (H)  11.5 - 15.5 % Final  Admission on 07/31/2022, Discharged on 07/31/2022  Component Date Value Ref Range Status   WBC 07/31/2022 8.3  4.0 - 10.5 K/uL Final   RBC 07/31/2022 3.43 (L)  4.22 - 5.81 MIL/uL Final    Hemoglobin 07/31/2022 13.3  13.0 - 17.0 g/dL Final   HCT 97/96/7975 36.9 (L)  39.0 - 52.0 % Final   MCV 07/31/2022 107.6 (H)  80.0 - 100.0 fL Final   MCH 07/31/2022 38.8 (H)  26.0 - 34.0 pg Final   MCHC 07/31/2022 36.0  30.0 - 36.0 g/dL Final   RDW 97/96/7975 16.4 (H)  11.5 - 15.5 % Final   Platelets 07/31/2022 128 (L)  150 - 400 K/uL Final   nRBC 07/31/2022 0.0  0.0 - 0.2 % Final   Neutrophils Relative % 07/31/2022 75  % Final   Neutro Abs 07/31/2022 6.3  1.7 - 7.7 K/uL Final   Lymphocytes Relative 07/31/2022 13  % Final   Lymphs Abs 07/31/2022 1.1  0.7 - 4.0 K/uL Final   Monocytes Relative 07/31/2022 10  % Final   Monocytes Absolute 07/31/2022 0.8  0.1 - 1.0 K/uL Final   Eosinophils Relative 07/31/2022 0  % Final   Eosinophils Absolute 07/31/2022 0.0  0.0 - 0.5 K/uL Final   Basophils Relative 07/31/2022 1  % Final   Basophils Absolute 07/31/2022 0.1  0.0 - 0.1 K/uL Final   Immature Granulocytes 07/31/2022 1  % Final   Abs Immature Granulocytes 07/31/2022 0.05  0.00 - 0.07 K/uL Final   Color, Urine 07/31/2022 AMBER (A)  YELLOW Final   APPearance 07/31/2022 CLEAR  CLEAR Final   Specific Gravity, Urine 07/31/2022 1.020  1.005 - 1.030 Final   pH 07/31/2022 6.0  5.0 - 8.0 Final   Glucose, UA 07/31/2022 NEGATIVE  NEGATIVE mg/dL Final   Hgb urine dipstick 07/31/2022 NEGATIVE  NEGATIVE Final   Bilirubin Urine 07/31/2022 MODERATE (A)  NEGATIVE Final   Ketones, ur 07/31/2022 NEGATIVE  NEGATIVE mg/dL Final   Protein, ur 97/96/7975 30 (A)  NEGATIVE mg/dL Final   Nitrite 97/96/7975 NEGATIVE  NEGATIVE Final  Leukocytes,Ua 07/31/2022 NEGATIVE  NEGATIVE Final   RBC / HPF 07/31/2022 0-5  0 - 5 RBC/hpf Final   WBC, UA 07/31/2022 0-5  0 - 5 WBC/hpf Final   Bacteria, UA 07/31/2022 MANY (A)  NONE SEEN Final   Squamous Epithelial / HPF 07/31/2022 0-5  0 - 5 /HPF Final   Sodium 07/31/2022 132 (L)  135 - 145 mmol/L Final   Potassium 07/31/2022 3.4 (L)  3.5 - 5.1 mmol/L Final   Chloride 07/31/2022 95  (L)  98 - 111 mmol/L Final   CO2 07/31/2022 28  22 - 32 mmol/L Final   Glucose, Bld 07/31/2022 137 (H)  70 - 99 mg/dL Final   BUN 97/96/7975 <5 (L)  6 - 20 mg/dL Final   Creatinine, Ser 07/31/2022 0.59 (L)  0.61 - 1.24 mg/dL Final   Calcium  07/31/2022 7.6 (L)  8.9 - 10.3 mg/dL Final   Total Protein 97/96/7975 6.9  6.5 - 8.1 g/dL Final   Albumin 97/96/7975 2.8 (L)  3.5 - 5.0 g/dL Final   AST 97/96/7975 175 (H)  15 - 41 U/L Final   ALT 07/31/2022 37  0 - 44 U/L Final   Alkaline Phosphatase 07/31/2022 245 (H)  38 - 126 U/L Final   Total Bilirubin 07/31/2022 2.6 (H)  0.3 - 1.2 mg/dL Final   GFR, Estimated 07/31/2022 >60  >60 mL/min Final   Anion gap 07/31/2022 9  5 - 15 Final   Lipase 07/31/2022 23  11 - 51 U/L Final  There may be more visits with results that are not included.  No image results found. No results found.       ASSESSMENT & PLAN   Assessment & Plan Depression, unspecified depression type Alcohol use disorder Insomnia, unspecified type Major depressive disorder, single episode, unspecified   Major depressive disorder is managed with Zoloft  50 mg daily. He reports improvement in symptoms and satisfaction with the medication. His PHQ score improved from 22 to 6, indicating significant improvement. Continue Zoloft  50 mg daily. Perform PHQ screening to monitor depression severity. Consider dose adjustment based on PHQ score and preference. Discuss potential side effects of Zoloft , including erectile dysfunction. Advise against abrupt discontinuation of Zoloft .  Alcohol use disorder, in remission   Alcohol use disorder is in remission. He reports no alcohol consumption and uses gabapentin  to manage cravings. Discussed the importance of avoiding alcohol due to severe liver injury. Gabapentin  prescription was corrected to 400 mg four times a day. Emphasized the need for vitamins B1 and B12 to prevent permanent nerve damage. Continue gabapentin  400 mg four times a day. Avoid alcohol  consumption. Consider referral to therapy or support groups for additional coping strategies. Continue B1 and B12 supplementation.  Alcoholic liver disease, unspecified   Alcoholic liver disease is well-managed with no reported symptoms of liver tenderness or fluid gain. Advised to avoid alcohol and junk food to prevent further liver damage. Emphasized the importance of nutrition and vitamins, particularly B1 and B12, to support liver health. Discussed the risks of herbal supplements, particularly ashwagandha, which can harm the liver. Avoid alcohol and junk food. Continue B1 and B12 supplementation. Avoid herbal supplements, especially ashwagandha. Emphasize the importance of nutrition and vitamins for liver health.  Restless legs syndrome   Restless legs syndrome was not discussed in detail during the encounter.  General Health Maintenance   Discussed the importance of exercise and vitamin D  supplementation to improve energy and mood. Recommended TRX suspension trainer for home exercise to enhance physical  fitness without gym equipment. Continue vitamin D  supplementation. Consider purchasing TRX suspension trainer for home exercise. Encourage regular physical activity to improve mood and energy levels.  ORDER ASSOCIATIONS  #   DIAGNOSIS / CONDITION ICD-10 ENCOUNTER ORDER     ICD-10-CM   1. Depression, unspecified depression type  F32.A     2. Alcohol use disorder  F10.90 gabapentin  (NEURONTIN ) 400 MG capsule    3. Insomnia, unspecified type  G47.00 traZODone  (DESYREL ) 50 MG tablet           Orders Placed in Encounter:   Meds ordered this encounter  Medications   gabapentin  (NEURONTIN ) 400 MG capsule    Sig: Take 1 capsule (400 mg total) by mouth 4 (four) times daily.    Dispense:  360 capsule    Refill:  1   traZODone  (DESYREL ) 50 MG tablet    Sig: Take 1 tablet (50 mg total) by mouth at bedtime.    Dispense:  30 tablet    Refill:  6     This document was synthesized by  artificial intelligence ([employer]) using HIPAA-compliant recording of the clinical interaction;   We discussed the use of AI scribe software for clinical note transcription with the patient, who gave verbal consent to proceed. additional Info: This encounter employed state-of-the-art, real-time, collaborative documentation. The patient actively reviewed and assisted in updating their electronic medical record on a shared screen, ensuring transparency and facilitating joint problem-solving for the problem list, overview, and plan. This approach promotes accurate, informed care. The treatment plan was discussed and reviewed in detail, including medication safety, potential side effects, and all patient questions. We confirmed understanding and comfort with the plan. Follow-up instructions were established, including contacting the office for any concerns, returning if symptoms worsen, persist, or new symptoms develop, and precautions for potential emergency department visits.

## 2024-07-04 ENCOUNTER — Other Ambulatory Visit: Payer: Self-pay | Admitting: Internal Medicine

## 2024-07-11 ENCOUNTER — Ambulatory Visit: Admitting: Internal Medicine

## 2024-10-24 ENCOUNTER — Encounter: Admitting: Internal Medicine
